# Patient Record
Sex: Male | Born: 1965 | Race: White | Hispanic: No | Marital: Married | Smoking: Current every day smoker
Health system: Southern US, Community
[De-identification: ages and names within clinical notes are randomized; demographics above are authoritative.]

## PROBLEM LIST (undated history)

## (undated) DIAGNOSIS — K219 Gastro-esophageal reflux disease without esophagitis: Secondary | ICD-10-CM

## (undated) DIAGNOSIS — I10 Essential (primary) hypertension: Secondary | ICD-10-CM

## (undated) DIAGNOSIS — Z87442 Personal history of urinary calculi: Secondary | ICD-10-CM

## (undated) DIAGNOSIS — R7303 Prediabetes: Secondary | ICD-10-CM

## (undated) HISTORY — DX: Prediabetes: R73.03

## (undated) HISTORY — PX: TOTAL HIP ARTHROPLASTY: SHX124

## (undated) HISTORY — DX: Essential (primary) hypertension: I10

## (undated) HISTORY — PX: OTHER SURGICAL HISTORY: SHX169

---

## 1992-12-21 HISTORY — PX: SPLENECTOMY: SUR1306

## 2001-06-09 ENCOUNTER — Emergency Department (HOSPITAL_COMMUNITY): Admission: EM | Admit: 2001-06-09 | Discharge: 2001-06-10 | Payer: Self-pay | Admitting: Emergency Medicine

## 2001-06-10 ENCOUNTER — Encounter: Payer: Self-pay | Admitting: Emergency Medicine

## 2002-02-11 ENCOUNTER — Emergency Department (HOSPITAL_COMMUNITY): Admission: EM | Admit: 2002-02-11 | Discharge: 2002-02-11 | Payer: Self-pay | Admitting: Emergency Medicine

## 2002-02-11 ENCOUNTER — Encounter: Payer: Self-pay | Admitting: Emergency Medicine

## 2003-08-02 ENCOUNTER — Encounter: Admission: RE | Admit: 2003-08-02 | Discharge: 2003-10-22 | Payer: Self-pay | Admitting: Occupational Medicine

## 2013-10-27 ENCOUNTER — Other Ambulatory Visit: Payer: Self-pay | Admitting: Internal Medicine

## 2013-10-27 ENCOUNTER — Ambulatory Visit (HOSPITAL_COMMUNITY)
Admission: RE | Admit: 2013-10-27 | Discharge: 2013-10-27 | Disposition: A | Discharge status: Home / Self Care | Payer: BC Managed Care – PPO | Source: Ambulatory Visit | Attending: Internal Medicine | Admitting: Internal Medicine

## 2013-10-27 DIAGNOSIS — I10 Essential (primary) hypertension: Secondary | ICD-10-CM

## 2013-10-27 DIAGNOSIS — F17200 Nicotine dependence, unspecified, uncomplicated: Secondary | ICD-10-CM | POA: Insufficient documentation

## 2013-10-30 ENCOUNTER — Telehealth: Payer: Self-pay | Admitting: *Deleted

## 2013-10-30 NOTE — Telephone Encounter (Signed)
Message copied by Reggy Eye on Mon Oct 30, 2013  8:55 AM ------      Message from: Lucky Cowboy      Created: Sun Oct 29, 2013  5:53 PM       Call CXR Normal- neg & OK ------

## 2013-12-12 ENCOUNTER — Encounter: Payer: Self-pay | Admitting: Emergency Medicine

## 2013-12-12 ENCOUNTER — Ambulatory Visit (INDEPENDENT_AMBULATORY_CARE_PROVIDER_SITE_OTHER): Payer: BC Managed Care – PPO | Admitting: Emergency Medicine

## 2013-12-12 VITALS — BP 134/82 | HR 88 | Temp 100.0°F | Resp 18 | Ht 74.25 in | Wt 276.0 lb

## 2013-12-12 DIAGNOSIS — J101 Influenza due to other identified influenza virus with other respiratory manifestations: Secondary | ICD-10-CM

## 2013-12-12 DIAGNOSIS — J111 Influenza due to unidentified influenza virus with other respiratory manifestations: Secondary | ICD-10-CM

## 2013-12-12 DIAGNOSIS — R509 Fever, unspecified: Secondary | ICD-10-CM

## 2013-12-12 DIAGNOSIS — E559 Vitamin D deficiency, unspecified: Secondary | ICD-10-CM

## 2013-12-12 DIAGNOSIS — R52 Pain, unspecified: Secondary | ICD-10-CM

## 2013-12-12 MED ORDER — ALBUTEROL SULFATE HFA 108 (90 BASE) MCG/ACT IN AERS: 2.0000 | INHALATION_SPRAY | Freq: Four times a day (QID) | RESPIRATORY_TRACT | Status: DC | PRN

## 2013-12-12 MED ORDER — AZITHROMYCIN 250 MG PO TABS: ORAL_TABLET | ORAL | Status: AC

## 2013-12-12 MED ORDER — PREDNISONE 10 MG PO TABS: ORAL_TABLET | ORAL | Status: DC

## 2013-12-12 MED ORDER — BENZONATATE 100 MG PO CAPS: 100.0000 mg | ORAL_CAPSULE | Freq: Three times a day (TID) | ORAL | Status: DC | PRN

## 2013-12-12 NOTE — Patient Instructions (Signed)

## 2013-12-12 NOTE — Progress Notes (Signed)
   Subjective:    Patient ID: Erik Quinn, male    DOB: 1966/04/26, 47 y.o.   MRN: [id number]  HPI Comments: 47 yo ill appearing male presents with sudden onset of fever, fatigue, myalgias/ arthralgias and congestion. He has not tried any OTC. He denies any know flu exposures. He did receive the FLU Vaccine this year.     Medicines Vitamin D 2000  NKDA  No past medical history on file.   Review of Systems  Constitutional: Positive for fever, chills and fatigue.  Respiratory: Positive for cough.   All other systems reviewed and are negative.   BP 134/82  Pulse 88  Temp(Src) 100 F (37.8 C) (Temporal)  Resp 18  Ht 6' 2.25" (1.886 m)  Wt 276 lb (125.193 kg)  BMI 35.20 kg/m2     Objective:   Physical Exam  Nursing note and vitals reviewed. Constitutional: He is oriented to person, place, and time. He appears well-developed and well-nourished.  Appears ill  HENT:  Head: Normocephalic and atraumatic.  Right Ear: External ear normal.  Left Ear: External ear normal.  Nose: Nose normal.  Mouth/Throat: Oropharynx is clear and moist. No oropharyngeal exudate.  TMS Injected  Eyes: Conjunctivae are normal.  Injected sclera bilaterally  Neck: Normal range of motion.  Cardiovascular: Normal rate, regular rhythm, normal heart sounds and intact distal pulses.   Pulmonary/Chest: Effort normal. He has wheezes.  Abdominal: Soft.  Musculoskeletal: Normal range of motion.  Lymphadenopathy:    He has no cervical adenopathy.  Neurological: He is alert and oriented to person, place, and time.  Skin: Skin is warm and dry.  Psychiatric: He has a normal mood and affect. Judgment normal.          Assessment & Plan:  INFLUENZA A- Declines Tamiflu. Pred 10 mg DP, Tessalon Perles 100 mg, Albuterol HFA all AD. Zpak if symptoms increase. ER IF worse SX

## 2014-10-21 DIAGNOSIS — I10 Essential (primary) hypertension: Secondary | ICD-10-CM | POA: Insufficient documentation

## 2014-10-21 DIAGNOSIS — R7303 Prediabetes: Secondary | ICD-10-CM | POA: Insufficient documentation

## 2014-10-24 ENCOUNTER — Other Ambulatory Visit: Payer: Self-pay | Admitting: Internal Medicine

## 2014-10-24 ENCOUNTER — Ambulatory Visit (INDEPENDENT_AMBULATORY_CARE_PROVIDER_SITE_OTHER): Payer: BC Managed Care – PPO | Admitting: Internal Medicine

## 2014-10-24 ENCOUNTER — Ambulatory Visit (HOSPITAL_COMMUNITY)
Admission: RE | Admit: 2014-10-24 | Discharge: 2014-10-24 | Disposition: A | Discharge status: Home / Self Care | Payer: BC Managed Care – PPO | Source: Ambulatory Visit | Attending: Internal Medicine | Admitting: Internal Medicine

## 2014-10-24 ENCOUNTER — Encounter: Payer: Self-pay | Admitting: Internal Medicine

## 2014-10-24 VITALS — BP 114/84 | HR 64 | Temp 97.6°F | Resp 16 | Ht 74.5 in | Wt 266.0 lb

## 2014-10-24 DIAGNOSIS — Z125 Encounter for screening for malignant neoplasm of prostate: Secondary | ICD-10-CM

## 2014-10-24 DIAGNOSIS — I10 Essential (primary) hypertension: Secondary | ICD-10-CM | POA: Insufficient documentation

## 2014-10-24 DIAGNOSIS — M25559 Pain in unspecified hip: Secondary | ICD-10-CM

## 2014-10-24 DIAGNOSIS — R7309 Other abnormal glucose: Secondary | ICD-10-CM | POA: Diagnosis not present

## 2014-10-24 DIAGNOSIS — Z0001 Encounter for general adult medical examination with abnormal findings: Secondary | ICD-10-CM

## 2014-10-24 DIAGNOSIS — R7989 Other specified abnormal findings of blood chemistry: Secondary | ICD-10-CM

## 2014-10-24 DIAGNOSIS — R6889 Other general symptoms and signs: Secondary | ICD-10-CM

## 2014-10-24 DIAGNOSIS — Z23 Encounter for immunization: Secondary | ICD-10-CM

## 2014-10-24 DIAGNOSIS — R7303 Prediabetes: Secondary | ICD-10-CM

## 2014-10-24 DIAGNOSIS — M25552 Pain in left hip: Secondary | ICD-10-CM | POA: Diagnosis not present

## 2014-10-24 DIAGNOSIS — Z111 Encounter for screening for respiratory tuberculosis: Secondary | ICD-10-CM

## 2014-10-24 DIAGNOSIS — R945 Abnormal results of liver function studies: Secondary | ICD-10-CM

## 2014-10-24 DIAGNOSIS — Z1212 Encounter for screening for malignant neoplasm of rectum: Secondary | ICD-10-CM

## 2014-10-24 DIAGNOSIS — E559 Vitamin D deficiency, unspecified: Secondary | ICD-10-CM

## 2014-10-24 DIAGNOSIS — Z79899 Other long term (current) drug therapy: Secondary | ICD-10-CM

## 2014-10-24 LAB — CBC WITH DIFFERENTIAL/PLATELET
BASOS PCT: 0 % (ref 0–1)
Basophils Absolute: 0 10*3/uL (ref 0.0–0.1)
EOS ABS: 0.2 10*3/uL (ref 0.0–0.7)
Eosinophils Relative: 2 % (ref 0–5)
HEMATOCRIT: 50.3 % (ref 39.0–52.0)
Hemoglobin: 17.6 g/dL — ABNORMAL HIGH (ref 13.0–17.0)
LYMPHS ABS: 2.6 10*3/uL (ref 0.7–4.0)
Lymphocytes Relative: 29 % (ref 12–46)
MCH: 35.3 pg — ABNORMAL HIGH (ref 26.0–34.0)
MCHC: 35 g/dL (ref 30.0–36.0)
MCV: 101 fL — ABNORMAL HIGH (ref 78.0–100.0)
MONO ABS: 1 10*3/uL (ref 0.1–1.0)
Monocytes Relative: 11 % (ref 3–12)
Neutro Abs: 5.2 10*3/uL (ref 1.7–7.7)
Neutrophils Relative %: 58 % (ref 43–77)
Platelets: 263 10*3/uL (ref 150–400)
RBC: 4.98 MIL/uL (ref 4.22–5.81)
RDW: 13.7 % (ref 11.5–15.5)
WBC: 8.9 10*3/uL (ref 4.0–10.5)

## 2014-10-24 NOTE — Patient Instructions (Signed)
Recommend the book "The END of DIETING" by Dr Baker Janus   and the book "The END of DIABETES " by Dr Excell Seltzer  At [email or web address] - get book & Audio CD's      Being diabetic has a  300% increased risk for heart attack, stroke, cancer, and alzheimer- type vascular dementia. It is very important that you work harder with diet by avoiding all foods that are white except chicken & fish. Avoid white rice (brown & wild rice is OK), white potatoes (sweetpotatoes in moderation is OK), White bread or wheat bread or anything made out of white flour like bagels, donuts, rolls, buns, biscuits, cakes, pastries, cookies, pizza crust, and pasta (made from white flour & egg whites) - vegetarian pasta or spinach or wheat pasta is OK. Multigrain breads like Arnold's or [employer], or multigrain sandwich thins or flatbreads.  Diet, exercise and weight loss can reverse and cure diabetes in the early stages.  Diet, exercise and weight loss is very important in the control and prevention of complications of diabetes which affects every system in your body, ie. Brain - dementia/stroke, eyes - glaucoma/blindness, heart - heart attack/heart failure, kidneys - dialysis, stomach - gastric paralysis, intestines - malabsorption, nerves - severe painful neuritis, circulation - gangrene & loss of a leg(s), and finally cancer and Alzheimers.    I recommend avoid fried & greasy foods,  sweets/candy, white rice (brown or wild rice or Quinoa is OK), white potatoes (sweet potatoes are OK) - anything made from white flour - bagels, doughnuts, rolls, buns, biscuits,white and wheat breads, pizza crust and traditional pasta made of white flour & egg white(vegetarian pasta or spinach or wheat pasta is OK).  Multi-grain bread is OK - like multi-grain flat bread or sandwich thins. Avoid alcohol in excess. Exercise is also important.    Eat all the vegetables you want - avoid meat, especially red meat and dairy - especially cheese.  Cheese  is the most concentrated form of trans-fats which is the worst thing to clog up our arteries. Veggie cheese is OK which can be found in the fresh produce section at [employer] or [employer] or [employer]   Preventive Care for Adults  A healthy lifestyle and preventive care can promote health and wellness. Preventive health guidelines for men include the following key practices:  A routine yearly physical is a good way to check with your health care provider about your health and preventative screening. It is a chance to share any concerns and updates on your health and to receive a thorough exam.  Visit your dentist for a routine exam and preventative care every 6 months. Brush your teeth twice a day and floss once a day. Good oral hygiene prevents tooth decay and gum disease.  The frequency of eye exams is based on your age, health, family medical history, use of contact lenses, and other factors. Follow your health care provider's recommendations for frequency of eye exams.  Eat a healthy diet. Foods such as vegetables, fruits, whole grains, low-fat dairy products, and lean protein foods contain the nutrients you need without too many calories. Decrease your intake of foods high in solid fats, added sugars, and salt. Eat the right amount of calories for you.Get information about a proper diet from your health care provider, if necessary.  Regular physical exercise is one of the most important things you can do for your health. Most adults should get at least 150 minutes of moderate-intensity exercise (any  increases your heart rate and causes you to sweat) each week. In addition, most adults need muscle-strengthening exercises on 2 or more days a week.  Maintain a healthy weight. The body mass index (BMI) is a screening tool to identify possible weight problems. It provides an estimate of body fat based on height and weight. Your health care provider can find your BMI and can help you  achieve or maintain a healthy weight.For adults 20 years and older:  A BMI below 18.5 is considered underweight.  A BMI of 18.5 to 24.9 is normal.  A BMI of 25 to 29.9 is considered overweight.  A BMI of 30 and above is considered obese.  Maintain normal blood lipids and cholesterol levels by exercising and minimizing your intake of saturated fat. Eat a balanced diet with plenty of fruit and vegetables. Blood tests for lipids and cholesterol should begin at age 20 and be repeated every 5 years. If your lipid or cholesterol levels are high, you are over 50, or you are at high risk for heart disease, you may need your cholesterol levels checked more frequently.Ongoing high lipid and cholesterol levels should be treated with medicines if diet and exercise are not working.  If you smoke, find out from your health care provider how to quit. If you do not use tobacco, do not start.  Lung cancer screening is recommended for adults aged 55-80 years who are at high risk for developing lung cancer because of a history of smoking. A yearly low-dose CT scan of the lungs is recommended for people who have at least a 30-pack-year history of smoking and are a current smoker or have quit within the past 15 years. A pack year of smoking is smoking an average of 1 pack of cigarettes a day for 1 year (for example: 1 pack a day for 30 years or 2 packs a day for 15 years). Yearly screening should continue until the smoker has stopped smoking for at least 15 years. Yearly screening should be stopped for people who develop a health problem that would prevent them from having lung cancer treatment.  If you choose to drink alcohol, do not have more than 2 drinks per day. One drink is considered to be 12 ounces (355 mL) of beer, 5 ounces (148 mL) of wine, or 1.5 ounces (44 mL) of liquor.  Avoid use of street drugs. Do not share needles with anyone. Ask for help if you need support or instructions about stopping the use of  drugs.  High blood pressure causes heart disease and increases the risk of stroke. Your blood pressure should be checked at least every 1-2 years. Ongoing high blood pressure should be treated with medicines, if weight loss and exercise are not effective.  If you are 45-79 years old, ask your health care provider if you should take aspirin to prevent heart disease.  Diabetes screening involves taking a blood sample to check your fasting blood sugar level. This should be done once every 3 years, after age 45, if you are within normal weight and without risk factors for diabetes. Testing should be considered at a younger age or be carried out more frequently if you are overweight and have at least 1 risk factor for diabetes.  Colorectal cancer can be detected and often prevented. Most routine colorectal cancer screening begins at the age of 50 and continues through age 75. However, your health care provider may recommend screening at an earlier age if you have risk   have risk factors for colon cancer. On a yearly basis, your health care provider may provide home test kits to check for hidden blood in the stool. Use of a small camera at the end of a tube to directly examine the colon (sigmoidoscopy or colonoscopy) can detect the earliest forms of colorectal cancer. Talk to your health care provider about this at age 23, when routine screening begins. Direct exam of the colon should be repeated every 5-10 years through age 48, unless early forms of precancerous polyps or small growths are found.   Talk with your health care provider about prostate cancer screening.  Testicular cancer screening isrecommended for adult males. Screening includes self-exam, a health care provider exam, and other screening tests. Consult with your health care provider about any symptoms you have or any concerns you have about testicular cancer.  Use sunscreen. Apply sunscreen liberally and repeatedly throughout the day. You should  seek shade when your shadow is shorter than you. Protect yourself by wearing long sleeves, pants, a wide-brimmed hat, and sunglasses year round, whenever you are outdoors.  Once a month, do a whole-body skin exam, using a mirror to look at the skin on your back. Tell your health care provider about new moles, moles that have irregular borders, moles that are larger than a pencil eraser, or moles that have changed in shape or color.  Stay current with required vaccines (immunizations).  Influenza vaccine. All adults should be immunized every year.  Tetanus, diphtheria, and acellular pertussis (Td, Tdap) vaccine. An adult who has not previously received Tdap or who does not know his vaccine status should receive 1 dose of Tdap. This initial dose should be followed by tetanus and diphtheria toxoids (Td) booster doses every 10 years. Adults with an unknown or incomplete history of completing a 3-dose immunization series with Td-containing vaccines should begin or complete a primary immunization series including a Tdap dose. Adults should receive a Td booster every 10 years.  Varicella vaccine. An adult without evidence of immunity to varicella should receive 2 doses or a second dose if he has previously received 1 dose.  Human papillomavirus (HPV) vaccine. Males aged 54-21 years who have not received the vaccine previously should receive the 3-dose series. Males aged 22-26 years may be immunized. Immunization is recommended through the age of 26 years for any male who has sex with males and did not get any or all doses earlier. Immunization is recommended for any person with an immunocompromised condition through the age of 33 years if he did not get any or all doses earlier. During the 3-dose series, the second dose should be obtained 4-8 weeks after the first dose. The third dose should be obtained 24 weeks after the first dose and 16 weeks after the second dose.  Zoster vaccine. One dose is recommended  for adults aged 60 years or older unless certain conditions are present.    PREVNAR  - Pneumococcal 13-valent conjugate (PCV13) vaccine. When indicated, a person who is uncertain of his immunization history and has no record of immunization should receive the PCV13 vaccine. An adult aged 40 years or older who has certain medical conditions and has not been previously immunized should receive 1 dose of PCV13 vaccine. This PCV13 should be followed with a dose of pneumococcal polysaccharide (PPSV23) vaccine. The PPSV23 vaccine dose should be obtained at least 8 weeks after the dose of PCV13 vaccine. An adult aged 4 years or older who has certain medical conditions and  previously received 1 or more doses of PPSV23 vaccine should receive 1 dose of PCV13. The PCV13 vaccine dose should be obtained 1 or more years after the last PPSV23 vaccine dose.    PNEUMOVAX - Pneumococcal polysaccharide (PPSV23) vaccine. When PCV13 is also indicated, PCV13 should be obtained first. All adults aged 76 years and older should be immunized. An adult younger than age 45 years who has certain medical conditions should be immunized. Any person who resides in a nursing home or long-term care facility should be immunized. An adult smoker should be immunized. People with an immunocompromised condition and certain other conditions should receive both PCV13 and PPSV23 vaccines. People with human immunodeficiency virus (HIV) infection should be immunized as soon as possible after diagnosis. Immunization during chemotherapy or radiation therapy should be avoided. Routine use of PPSV23 vaccine is not recommended for American Indians, [place] Natives, or people younger than 65 years unless there are medical conditions that require PPSV23 vaccine. When indicated, people who have unknown immunization and have no record of immunization should receive PPSV23 vaccine. One-time revaccination 5 years after the first dose of PPSV23 is recommended for  people aged 19-64 years who have chronic kidney failure, nephrotic syndrome, asplenia, or immunocompromised conditions. People who received 1-2 doses of PPSV23 before age 62 years should receive another dose of PPSV23 vaccine at age 50 years or later if at least 5 years have passed since the previous dose. Doses of PPSV23 are not needed for people immunized with PPSV23 at or after age 34 years.    Hepatitis A vaccine. Adults who wish to be protected from this disease, have certain high-risk conditions, work with hepatitis A-infected animals, work in hepatitis A research labs, or travel to or work in countries with a high rate of hepatitis A should be immunized. Adults who were previously unvaccinated and who anticipate close contact with an international adoptee during the first 60 days after arrival in the [place] from a country with a high rate of hepatitis A should be immunized.    Hepatitis B vaccine. Adults should be immunized if they wish to be protected from this disease, have certain high-risk conditions, may be exposed to blood or other infectious body fluids, are household contacts or sex partners of hepatitis B positive people, are clients or workers in certain care facilities, or travel to or work in countries with a high rate of hepatitis B.   Preventive Service / Frequency   Ages 2 to 57  Blood pressure check.  Lipid and cholesterol check.  Lung cancer screening. / Every year if you are aged 46-80 years and have a 30-pack-year history of smoking and currently smoke or have quit within the past 15 years. Yearly screening is stopped once you have quit smoking for at least 15 years or develop a health problem that would prevent you from having lung cancer treatment.  Fecal occult blood test (FOBT) of stool. / Every year beginning at age 37 and continuing until age 67. You may not have to do this test if you get a colonoscopy every 10 years.  Flexible sigmoidoscopy** or  colonoscopy.** / Every 5 years for a flexible sigmoidoscopy or every 10 years for a colonoscopy beginning at age 54 and continuing until age 28.

## 2014-10-24 NOTE — Progress Notes (Addendum)
Patient ID: Erik Quinn, male   DOB: [birth year], 48 y.o.   MRN: [id number] .  Annual Screening Comprehensive Examination  This very nice 48 y.o.MWM presents for complete physical.  Patient has been followed for HTN, T2_NIDDM  Prediabetes, Hyperlipidemia, and Vitamin D Deficiency.   Patient has hx/o labile HTN predating since Jan 2013 of 130/90 and in Oct 2014BP was 142/88.Marland Kitchen Patient's BP has been controlled at home.Today's BP: 114/84 mmHg. Patient denies any cardiac symptoms as chest pain, palpitations, shortness of breath, dizziness or ankle swelling.   Patient is screened for elevated lipids. Last lipids were Chol 97, TG 83, HDL 52 & LDL 28 in Nov 2013.   Patient has prediabetes and Insulin Resistance and patient denies reactive hypoglycemic symptoms, visual blurring, diabetic polys or paresthesias. Last A1c was 5.8% with elevated Insulin 46 in Nov 2013.    Finally, patient has history of Vitamin D Deficiency of 35 in Nov 2013.  Patient also has a history of Low T with Testosterone level of 138in Nov 2013.   Medication Sig  . albuterol (PROVENTIL HFA;VENTOLIN HFA) 108 (90 BASE) MCG/ACT inhaler Inhale 2 puffs into the lungs every 6 (six) hours as needed for wheezing or shortness of breath.   No Known Allergies  Past Medical History  Diagnosis Date  . Hypertension   . Prediabetes    Health Maintenance  Topic Date Due  . INFLUENZA VACCINE  07/21/2014  . TETANUS/TDAP  10/21/2023   Immunization History  Administered Date(s) Administered  . Influenza Split 10/24/2014  . PPD Test 10/24/2014  . Pneumococcal Conjugate-13 10/24/2014  . Pneumococcal-Unspecified 10/20/2013  . Tdap 10/20/2013   Past Surgical History  Procedure Laterality Date  . Splenectomy  1994   Family History  Problem Relation Age of Onset  . Diabetes Mother   . Sleep apnea Mother    History   Social History  . Marital Status: Single    Spouse Name: N/A    Number of Children: N/A  . Years of Education:  N/A   Occupational History  . Drives a fork-lift.   Social History Main Topics  . Smoking status: Current Every Day Smoker -- 0.33 packs/day    Types: Cigarettes  . Smokeless tobacco: Never Used  . Alcohol Use: 8.4 oz/week    14 Not specified per week  . Drug Use: No  . Sexual Activity: Active    ROS Constitutional: Denies fever, chills, weight loss/gain, headaches, insomnia, fatigue, night sweats or change in appetite. Eyes: Denies redness, blurred vision, diplopia, discharge, itchy or watery eyes.  ENT: Denies discharge, congestion, post nasal drip, epistaxis, sore throat, earache, hearing loss, dental pain, Tinnitus, Vertigo, Sinus pain or snoring.  Cardio: Denies chest pain, palpitations, irregular heartbeat, syncope, dyspnea, diaphoresis, orthopnea, PND, claudication or edema Respiratory: denies cough, dyspnea, DOE, pleurisy, hoarseness, laryngitis or wheezing.  Gastrointestinal: Denies dysphagia, heartburn, reflux, water brash, pain, cramps, nausea, vomiting, bloating, diarrhea, constipation, hematemesis, melena, hematochezia, jaundice or hemorrhoids Genitourinary: Denies dysuria, frequency, urgency, nocturia, hesitancy, discharge, hematuria or flank pain Musculoskeletal: Denies arthralgia, myalgia, stiffness, Jt. Swelling, pain, limp or strain/sprain. Denies Falls. Skin: Denies puritis, rash, hives, warts, acne, eczema or change in skin lesion Neuro: No weakness, tremor, incoordination, spasms, paresthesia or pain Psychiatric: Denies confusion, memory loss or sensory loss. Denies Depression. Endocrine: Denies change in weight, skin, hair change, nocturia, and paresthesia, diabetic polys, visual blurring or hyper / hypo glycemic episodes.  Heme/Lymph: No excessive bleeding, bruising or enlarged lymph nodes.  Physical Exam  BP  114/84 mmHg  Pulse 64  Temp(Src) 97.6 F (36.4 C)  Resp 16  Ht 6' 2.5" (1.892 m)  Wt 266 lb (120.657 kg)  BMI 33.71 kg/m2  General Appearance: Well  nourished, in no apparent distress. Eyes: PERRLA, EOMs, conjunctiva no swelling or erythema, normal fundi and vessels. Sinuses: No frontal/maxillary tenderness ENT/Mouth: EACs patent / TMs  nl. Nares clear without erythema, swelling, mucoid exudates. Oral hygiene is good. No erythema, swelling, or exudate. Tongue normal, non-obstructing. Tonsils not swollen or erythematous. Hearing normal.  Neck: Supple, thyroid normal. No bruits, nodes or JVD. Respiratory: Respiratory effort normal.  BS equal and clear bilateral without rales, rhonci, wheezing or stridor. Cardio: Heart sounds are normal with regular rate and rhythm and no murmurs, rubs or gallops. Peripheral pulses are normal and equal bilaterally without edema. No aortic or femoral bruits. Chest: symmetric with normal excursions and percussion.  Abdomen: Flat, soft, with bowl sounds. Nontender, no guarding, rebound, hernias, masses, or organomegaly.  Lymphatics: Non tender without lymphadenopathy.  Genitourinary: No hernias.Testes nl. DRE - prostate nl for age - smooth & firm w/o nodules. Musculoskeletal: Full ROM all peripheral extremities, joint stability, 5/5 strength, and normal gait. Skin: Warm and dry without rashes, lesions, cyanosis, clubbing or  ecchymosis.  Neuro: Cranial nerves intact, reflexes equal bilaterally. Normal muscle tone, no cerebellar symptoms. Sensation intact.  Pysch: Awake and oriented X 3with normal affect, insight and judgment appropriate.   Assessment and Plan  1. Annual Screening Examination 2. Hypertension, labile 3. Pre Diabetes 4. Vitamin D Deficiency 5. Testosterone Deficiency   Continue prudent diet as discussed, weight control, BP monitoring, regular exercise, and medications as discussed.  Discussed med effects and SE's. Routine screening labs and tests as requested with regular follow-up as recommended.

## 2014-10-25 ENCOUNTER — Telehealth: Payer: Self-pay | Admitting: *Deleted

## 2014-10-25 LAB — LIPID PANEL
CHOL/HDL RATIO: 1.8 ratio
CHOLESTEROL: 101 mg/dL (ref 0–200)
HDL: 55 mg/dL (ref >39)
LDL Cholesterol: 31 mg/dL (ref 0–99)
Triglycerides: 74 mg/dL (ref <150)
VLDL: 15 mg/dL (ref 0–40)

## 2014-10-25 LAB — TSH: TSH: 1.405 u[IU]/mL (ref 0.350–4.500)

## 2014-10-25 LAB — HEMOGLOBIN A1C
HEMOGLOBIN A1C: 5.6 % (ref <5.7)
MEAN PLASMA GLUCOSE: 114 mg/dL (ref <117)

## 2014-10-25 LAB — HEPATITIS B SURFACE ANTIBODY,QUALITATIVE: Hep B S Ab: NEGATIVE

## 2014-10-25 LAB — MICROALBUMIN / CREATININE URINE RATIO
Creatinine, Urine: 246.4 mg/dL
MICROALB UR: 0.8 mg/dL (ref <2.0)
Microalb Creat Ratio: 3.2 mg/g (ref 0.0–30.0)

## 2014-10-25 LAB — HEPATITIS A ANTIBODY, TOTAL: Hep A Total Ab: NONREACTIVE

## 2014-10-25 LAB — HEPATIC FUNCTION PANEL
ALT: 26 U/L (ref 0–53)
AST: 20 U/L (ref 0–37)
Albumin: 4.1 g/dL (ref 3.5–5.2)
Alkaline Phosphatase: 83 U/L (ref 39–117)
BILIRUBIN DIRECT: 0.3 mg/dL (ref 0.0–0.3)
BILIRUBIN INDIRECT: 0.7 mg/dL (ref 0.2–1.2)
Total Bilirubin: 1 mg/dL (ref 0.2–1.2)
Total Protein: 7.2 g/dL (ref 6.0–8.3)

## 2014-10-25 LAB — VITAMIN B12: VITAMIN B 12: 410 pg/mL (ref 211–911)

## 2014-10-25 LAB — IRON AND TIBC
%SAT: 73 % — ABNORMAL HIGH (ref 20–55)
Iron: 238 ug/dL — ABNORMAL HIGH (ref 42–165)
TIBC: 325 ug/dL (ref 215–435)
UIBC: 87 ug/dL — ABNORMAL LOW (ref 125–400)

## 2014-10-25 LAB — BASIC METABOLIC PANEL WITH GFR
BUN: 18 mg/dL (ref 6–23)
CALCIUM: 9.8 mg/dL (ref 8.4–10.5)
CO2: 27 mEq/L (ref 19–32)
Chloride: 105 mEq/L (ref 96–112)
Creat: 1.13 mg/dL (ref 0.50–1.35)
GFR, EST AFRICAN AMERICAN: 88 mL/min
GFR, Est Non African American: 76 mL/min
GLUCOSE: 90 mg/dL (ref 70–99)
POTASSIUM: 4.6 meq/L (ref 3.5–5.3)
Sodium: 140 mEq/L (ref 135–145)

## 2014-10-25 LAB — HEPATITIS B CORE ANTIBODY, TOTAL: Hep B Core Total Ab: NONREACTIVE

## 2014-10-25 LAB — URINALYSIS, MICROSCOPIC ONLY
Bacteria, UA: NONE SEEN
Casts: NONE SEEN
Crystals: NONE SEEN
Squamous Epithelial / LPF: NONE SEEN

## 2014-10-25 LAB — PSA: PSA: 0.34 ng/mL (ref <=4.00)

## 2014-10-25 LAB — INSULIN, FASTING: Insulin fasting, serum: 11 u[IU]/mL (ref 2.0–19.6)

## 2014-10-25 LAB — HEPATITIS C ANTIBODY: HCV AB: NEGATIVE

## 2014-10-25 LAB — TESTOSTERONE: Testosterone: 192 ng/dL — ABNORMAL LOW (ref 300–890)

## 2014-10-25 LAB — MAGNESIUM: Magnesium: 1.9 mg/dL (ref 1.5–2.5)

## 2014-10-25 LAB — VITAMIN D 25 HYDROXY (VIT D DEFICIENCY, FRACTURES): Vit D, 25-Hydroxy: 34 ng/mL (ref 30–89)

## 2014-10-25 NOTE — Telephone Encounter (Signed)
error 

## 2014-10-25 NOTE — Telephone Encounter (Deleted)
Error. No call made

## 2014-10-26 LAB — HEPATITIS B E ANTIBODY: Hepatitis Be Antibody: NONREACTIVE

## 2014-10-31 ENCOUNTER — Telehealth: Payer: Self-pay | Admitting: *Deleted

## 2014-10-31 ENCOUNTER — Other Ambulatory Visit: Payer: Self-pay | Admitting: Internal Medicine

## 2014-10-31 DIAGNOSIS — M25551 Pain in right hip: Secondary | ICD-10-CM

## 2014-10-31 DIAGNOSIS — M25552 Pain in left hip: Secondary | ICD-10-CM

## 2014-10-31 NOTE — Telephone Encounter (Signed)
Spoke with patient aboout labs.  Per Dr Melford Aase, cholesterol great at 101,A1c normal, testosterone low at 192(patient chose to watch level and no treatment), and vitamin D low at 34 and advised patient to take 10000 units of vitamin D daily.  Informed patient of hip x- ray result. Per Dr Melford Aase, since hips are still hurting , he will order MRI of hips.  Patient aware.

## 2014-11-28 ENCOUNTER — Other Ambulatory Visit (INDEPENDENT_AMBULATORY_CARE_PROVIDER_SITE_OTHER): Payer: BC Managed Care – PPO

## 2014-11-28 DIAGNOSIS — Z1212 Encounter for screening for malignant neoplasm of rectum: Secondary | ICD-10-CM

## 2014-11-28 LAB — POC HEMOCCULT BLD/STL (HOME/3-CARD/SCREEN)
Card #2 Fecal Occult Blod, POC: NEGATIVE
Card #3 Fecal Occult Blood, POC: NEGATIVE
Fecal Occult Blood, POC: NEGATIVE

## 2014-11-29 ENCOUNTER — Other Ambulatory Visit: Payer: Self-pay

## 2014-11-29 DIAGNOSIS — M6740 Ganglion, unspecified site: Secondary | ICD-10-CM

## 2015-02-16 ENCOUNTER — Encounter: Payer: Self-pay | Admitting: *Deleted

## 2015-02-17 ENCOUNTER — Emergency Department (HOSPITAL_COMMUNITY): Payer: BLUE CROSS/BLUE SHIELD

## 2015-02-17 ENCOUNTER — Emergency Department (HOSPITAL_COMMUNITY)
Admission: EM | Admit: 2015-02-17 | Discharge: 2015-02-17 | Disposition: A | Discharge status: Home / Self Care | Payer: BLUE CROSS/BLUE SHIELD | Attending: Emergency Medicine | Admitting: Emergency Medicine

## 2015-02-17 ENCOUNTER — Encounter (HOSPITAL_COMMUNITY): Payer: Self-pay | Admitting: *Deleted

## 2015-02-17 DIAGNOSIS — S199XXA Unspecified injury of neck, initial encounter: Secondary | ICD-10-CM | POA: Insufficient documentation

## 2015-02-17 DIAGNOSIS — Z720 Tobacco use: Secondary | ICD-10-CM | POA: Diagnosis not present

## 2015-02-17 DIAGNOSIS — W1789XA Other fall from one level to another, initial encounter: Secondary | ICD-10-CM | POA: Diagnosis not present

## 2015-02-17 DIAGNOSIS — Y9289 Other specified places as the place of occurrence of the external cause: Secondary | ICD-10-CM | POA: Insufficient documentation

## 2015-02-17 DIAGNOSIS — S4990XA Unspecified injury of shoulder and upper arm, unspecified arm, initial encounter: Secondary | ICD-10-CM

## 2015-02-17 DIAGNOSIS — Y998 Other external cause status: Secondary | ICD-10-CM | POA: Diagnosis not present

## 2015-02-17 DIAGNOSIS — Z79899 Other long term (current) drug therapy: Secondary | ICD-10-CM | POA: Insufficient documentation

## 2015-02-17 DIAGNOSIS — S2232XA Fracture of one rib, left side, initial encounter for closed fracture: Secondary | ICD-10-CM | POA: Insufficient documentation

## 2015-02-17 DIAGNOSIS — Y9389 Activity, other specified: Secondary | ICD-10-CM | POA: Diagnosis not present

## 2015-02-17 DIAGNOSIS — S42352A Displaced comminuted fracture of shaft of humerus, left arm, initial encounter for closed fracture: Secondary | ICD-10-CM | POA: Insufficient documentation

## 2015-02-17 DIAGNOSIS — S4992XA Unspecified injury of left shoulder and upper arm, initial encounter: Secondary | ICD-10-CM | POA: Diagnosis present

## 2015-02-17 DIAGNOSIS — S42292A Other displaced fracture of upper end of left humerus, initial encounter for closed fracture: Secondary | ICD-10-CM | POA: Insufficient documentation

## 2015-02-17 DIAGNOSIS — S42302A Unspecified fracture of shaft of humerus, left arm, initial encounter for closed fracture: Secondary | ICD-10-CM

## 2015-02-17 DIAGNOSIS — I10 Essential (primary) hypertension: Secondary | ICD-10-CM | POA: Diagnosis not present

## 2015-02-17 LAB — I-STAT CHEM 8, ED
BUN: 18 mg/dL (ref 6–23)
CALCIUM ION: 1.07 mmol/L — AB (ref 1.12–1.23)
CREATININE: 1 mg/dL (ref 0.50–1.35)
Chloride: 105 mmol/L (ref 96–112)
Glucose, Bld: 126 mg/dL — ABNORMAL HIGH (ref 70–99)
HEMATOCRIT: 48 % (ref 39.0–52.0)
HEMOGLOBIN: 16.3 g/dL (ref 13.0–17.0)
POTASSIUM: 4 mmol/L (ref 3.5–5.1)
SODIUM: 143 mmol/L (ref 135–145)
TCO2: 19 mmol/L (ref 0–100)

## 2015-02-17 LAB — CBC
HEMATOCRIT: 41.2 % (ref 39.0–52.0)
HEMOGLOBIN: 14.1 g/dL (ref 13.0–17.0)
MCH: 34.4 pg — AB (ref 26.0–34.0)
MCHC: 34.2 g/dL (ref 30.0–36.0)
MCV: 100.5 fL — ABNORMAL HIGH (ref 78.0–100.0)
Platelets: 218 10*3/uL (ref 150–400)
RBC: 4.1 MIL/uL — ABNORMAL LOW (ref 4.22–5.81)
RDW: 13.4 % (ref 11.5–15.5)
WBC: 14.3 10*3/uL — AB (ref 4.0–10.5)

## 2015-02-17 LAB — COMPREHENSIVE METABOLIC PANEL
ALBUMIN: 3.5 g/dL (ref 3.5–5.2)
ALT: 24 U/L (ref 0–53)
AST: 32 U/L (ref 0–37)
Alkaline Phosphatase: 68 U/L (ref 39–117)
Anion gap: 8 (ref 5–15)
BUN: 16 mg/dL (ref 6–23)
CO2: 23 mmol/L (ref 19–32)
Calcium: 8 mg/dL — ABNORMAL LOW (ref 8.4–10.5)
Chloride: 108 mmol/L (ref 96–112)
Creatinine, Ser: 0.86 mg/dL (ref 0.50–1.35)
GFR calc Af Amer: >90 mL/min (ref >90)
GFR calc non Af Amer: >90 mL/min (ref >90)
Glucose, Bld: 129 mg/dL — ABNORMAL HIGH (ref 70–99)
Potassium: 4.1 mmol/L (ref 3.5–5.1)
Sodium: 139 mmol/L (ref 135–145)
Total Bilirubin: 0.5 mg/dL (ref 0.3–1.2)
Total Protein: 5.7 g/dL — ABNORMAL LOW (ref 6.0–8.3)

## 2015-02-17 LAB — URINALYSIS, ROUTINE W REFLEX MICROSCOPIC
Bilirubin Urine: NEGATIVE
Glucose, UA: NEGATIVE mg/dL
KETONES UR: 15 mg/dL — AB
LEUKOCYTES UA: NEGATIVE
NITRITE: NEGATIVE
Protein, ur: 30 mg/dL — AB
Specific Gravity, Urine: >1.046 — ABNORMAL HIGH (ref 1.005–1.030)
Urobilinogen, UA: 0.2 mg/dL (ref 0.0–1.0)
pH: 5.5 (ref 5.0–8.0)

## 2015-02-17 LAB — RAPID URINE DRUG SCREEN, HOSP PERFORMED
AMPHETAMINES: NOT DETECTED
BARBITURATES: NOT DETECTED
Benzodiazepines: NOT DETECTED
Cocaine: NOT DETECTED
Opiates: POSITIVE — AB
TETRAHYDROCANNABINOL: POSITIVE — AB

## 2015-02-17 LAB — URINE MICROSCOPIC-ADD ON

## 2015-02-17 LAB — ETHANOL: Alcohol, Ethyl (B): 147 mg/dL — ABNORMAL HIGH (ref 0–9)

## 2015-02-17 LAB — PROTIME-INR
INR: 1.19 (ref 0.00–1.49)
PROTHROMBIN TIME: 15.3 s — AB (ref 11.6–15.2)

## 2015-02-17 MED ORDER — ONDANSETRON 4 MG PO TBDP
4.0000 mg | ORAL_TABLET | Freq: Once | ORAL | Status: DC
  Filled 2015-02-17: qty 1

## 2015-02-17 MED ORDER — HYDROCODONE-ACETAMINOPHEN 5-325 MG PO TABS: 1.0000 | ORAL_TABLET | Freq: Four times a day (QID) | ORAL | Status: DC | PRN

## 2015-02-17 MED ORDER — SODIUM CHLORIDE 0.9 % IV BOLUS (SEPSIS)
1000.0000 mL | Freq: Once | INTRAVENOUS | Status: AC
  Administered 2015-02-17: 1000 mL via INTRAVENOUS

## 2015-02-17 MED ORDER — ONDANSETRON HCL 4 MG/2ML IJ SOLN
4.0000 mg | Freq: Once | INTRAMUSCULAR | Status: AC
  Administered 2015-02-17: 4 mg via INTRAVENOUS
  Filled 2015-02-17: qty 2

## 2015-02-17 MED ORDER — IOHEXOL 300 MG/ML  SOLN
100.0000 mL | Freq: Once | INTRAMUSCULAR | Status: AC | PRN
  Administered 2015-02-17: 100 mL via INTRAVENOUS

## 2015-02-17 MED ORDER — NAPROXEN 500 MG PO TABS
500.0000 mg | ORAL_TABLET | Freq: Two times a day (BID) | ORAL | Status: DC
Start: 2015-02-17 — End: 2015-10-22

## 2015-02-17 MED ORDER — MORPHINE SULFATE 4 MG/ML IJ SOLN
4.0000 mg | Freq: Once | INTRAMUSCULAR | Status: AC
  Administered 2015-02-17: 4 mg via INTRAVENOUS
  Filled 2015-02-17: qty 1

## 2015-02-17 NOTE — ED Notes (Signed)
Per EMS- pt was drinking last night and fell off a porch.  Pt c/o left rib pain, SOB, and left arm pain.  4mg  Zofran and 100 Fentynl given in route.  Bp-112/70 R-18 O2-94 % RA CBG-123.  Pt A x 4, NAD.

## 2015-02-17 NOTE — ED Provider Notes (Signed)
CSN: [id number]     Arrival date & time 02/17/15  4008 History   First MD Initiated Contact with Patient 02/17/15 [phone]     Chief Complaint  Patient presents with  . Rib Injury  . Shortness of Breath  . Arm Injury     (Consider location/radiation/quality/duration/timing/severity/associated sxs/prior Treatment) HPI   This is a 49 year old man presents via EMS for fall, left arm, left rib pain and shortness of breath. He has a past medical history of hypertension, diabetes. He is a current daily smoker and drinks alcohol daily. There is a level V caveat due to alcohol intoxication. The patient states that he was at home last night. He states that he drank "more than the average man can tolerate." He fell off of a 7 foot high porch onto his stairs landing on the left arm and ribs. He states it took about 2 hours to drag himself into the house. The patient called EMS and was brought here for further evaluation. He denies head trauma, loss of consciousness, hip pain, leg pain or back pain.  Past Medical History  Diagnosis Date  . Hypertension   . Prediabetes    Past Surgical History  Procedure Laterality Date  . Splenectomy  1994   Family History  Problem Relation Age of Onset  . Diabetes Mother   . Sleep apnea Mother    History  Substance Use Topics  . Smoking status: Current Every Day Smoker -- 0.33 packs/day    Types: Cigarettes  . Smokeless tobacco: Never Used  . Alcohol Use: 8.4 oz/week    14 Standard drinks or equivalent per week    Review of Systems  Unable to perform ROS     Allergies  Review of patient's allergies indicates no known allergies.  Home Medications   Prior to Admission medications   Medication Sig Start Date End Date Taking? Authorizing Provider  albuterol (PROVENTIL HFA;VENTOLIN HFA) 108 (90 BASE) MCG/ACT inhaler Inhale 2 puffs into the lungs every 6 (six) hours as needed for wheezing or shortness of breath. 12/12/13  Yes Melissa R Smith, PA-C    HYDROcodone-acetaminophen (NORCO) 5-325 MG per tablet Take 1-2 tablets by mouth every 6 (six) hours as needed for moderate pain. 02/17/15   Margarita Mail, PA-C  naproxen (NAPROSYN) 500 MG tablet Take 1 tablet (500 mg total) by mouth 2 (two) times daily with a meal. 02/17/15   Margarita Mail, PA-C   BP 113/72 mmHg  Pulse 79  Temp(Src) 97.9 F (36.6 C) (Oral)  Resp 24  SpO2 97% Physical Exam  Constitutional: He appears well-developed and well-nourished. No distress.  HENT:  Head: Normocephalic and atraumatic.  Right Ear: External ear normal.  Left Ear: External ear normal.  Mouth/Throat: Oropharynx is clear and moist.  Eyes: Conjunctivae and EOM are normal. Pupils are equal, round, and reactive to light.  Neck: Normal range of motion. Neck supple. No JVD present.  Midline cervical tenderness  Cardiovascular: Normal rate, regular rhythm and normal heart sounds.   Pulmonary/Chest:    Breathing is shallow  Musculoskeletal:       Arms: Nursing note and vitals reviewed.   ED Course  SPLINT APPLICATION Date/Time: 08/26/931 8:05 AM Performed by: Margarita Mail Authorized by: Margarita Mail Consent: Verbal consent obtained. Risks and benefits: risks, benefits and alternatives were discussed Time out: Immediately prior to procedure a "time out" was called to verify the correct patient, procedure, equipment, support staff and site/side marked as required. Location details: left arm Splint type:  Short arm coaptation splint. Post-procedure: The splinted body part was neurovascularly unchanged following the procedure. Patient tolerance: Patient tolerated the procedure well with no immediate complications   (including critical care time) Labs Review Labs Reviewed  CBC - Abnormal; Notable for the following:    WBC 14.3 (*)    RBC 4.10 (*)    MCV 100.5 (*)    MCH 34.4 (*)    All other components within normal limits  COMPREHENSIVE METABOLIC PANEL - Abnormal; Notable for the  following:    Glucose, Bld 129 (*)    Calcium 8.0 (*)    Total Protein 5.7 (*)    All other components within normal limits  ETHANOL - Abnormal; Notable for the following:    Alcohol, Ethyl (B) 147 (*)    All other components within normal limits  URINALYSIS, ROUTINE W REFLEX MICROSCOPIC - Abnormal; Notable for the following:    Specific Gravity, Urine >1.046 (*)    Hgb urine dipstick TRACE (*)    Ketones, ur 15 (*)    Protein, ur 30 (*)    All other components within normal limits  URINE RAPID DRUG SCREEN (HOSP PERFORMED) - Abnormal; Notable for the following:    Opiates POSITIVE (*)    Tetrahydrocannabinol POSITIVE (*)    All other components within normal limits  PROTIME-INR - Abnormal; Notable for the following:    Prothrombin Time 15.3 (*)    All other components within normal limits  URINE MICROSCOPIC-ADD ON - Abnormal; Notable for the following:    Casts HYALINE CASTS (*)    All other components within normal limits  I-STAT CHEM 8, ED - Abnormal; Notable for the following:    Glucose, Bld 126 (*)    Calcium, Ion 1.07 (*)    All other components within normal limits    Imaging Review Dg Chest 1 View  02/17/2015   CLINICAL DATA:  49 year old male status post fall downstairs  EXAM: CHEST  1 VIEW  COMPARISON:  Prior chest x-ray 10/25/2014  FINDINGS: Cardiac and mediastinal contours are within normal limits. There may be minimal pulmonary vascular congestion with slight cephalization. No evidence of pneumothorax. Incidental note is made of an azygos fissure. There is an acute displaced fracture of the lateral aspect of the left seventh rib. Slight thickening of the underlying pleura consistent with subpleural edema or hematoma. No evidence of hemo thorax. There may also be a mildly displaced fracture of the left sixth rib.  IMPRESSION: 1. Acute displaced fracture of the lateral aspect of the left seventh rib. 2. Suspect minimally displaced fracture of the lateral aspect of the left  6 rib as well. 3. Associated mild subpleural thickening likely reflecting edema or hematoma. 4. Mild pulmonary vascular congestion with cephalization. No overt pulmonary edema.   Electronically Signed   By: Jacqulynn Cadet M.D.   On: 02/17/2015 11:25   Ct Chest W Contrast  02/17/2015   CLINICAL DATA:  Trauma, fall from porch. Rib and chest pain. Left arm pain. Short of breath.  EXAM: CT CHEST, ABDOMEN, AND PELVIS WITH CONTRAST  TECHNIQUE: Multidetector CT imaging of the chest, abdomen and pelvis was performed following the standard protocol during bolus administration of intravenous contrast.  CONTRAST:  134mL OMNIPAQUE IOHEXOL 300 MG/ML  SOLN  COMPARISON:  Chest radiograph 10/25/2014  FINDINGS: CT CHEST FINDINGS  Mediastinum/Nodes: No contour abnormality aorta to suggest a dissection or transsection. Great vessels are normal. Incidental note of the aberrant right subclavian artery coursing behind the esophagus.  No pericardial fluid. Esophagus is normal. No mediastinal hematoma.  Lungs/Pleura: Prominent azygos fissure and azygos vein. Mild bibasilar atelectasis present. No pulmonary contusion or pleural fluid. No pneumothorax.  Musculoskeletal: Minimally displaced fractures of the sixth and seventh left lateral ribs (image 36 and 48 of series 3. There is a fracture of the proximal left humerus which is incompletely imaged.  CT ABDOMEN AND PELVIS FINDINGS  Hepatobiliary: No evidence of solid organ injury to the liver. Gallbladder normal.  Pancreas: No pancreatic injury.  Spleen: Post splenectomy. There several small splenules in the left upper quadrant which are normal  Adrenals/Urinary Tract: The small nodule of the left adrenal gland which measures 16 mm. This has washout characteristics (greater than 40%) which there is consistent with benign adenoma. Kidneys are normal. There is a nonobstructing calculus in the left kidney. No ureteral or renal injury. The bladder is intact.  Stomach/Bowel: Stomach, small  bowel, and colon are unremarkable. No evidence of mesenteric injury.  Vascular/Lymphatic: 12 aorta is normal caliber without traumatic injury. No abdominal pelvic lymphadenopathy.  Reproductive: Prostate gland is normal.  Musculoskeletal: No pelvic fracture or sacral fracture. No spine fracture. The  IMPRESSION: Chest Impression:  1. Fracture of the left lateral sixth and seventh ribs. No associated pneumothorax. 2. Incomplete imaging of proximal left humerus fracture. 3. No evidence of mediastinal injury. Aberrant right subclavian artery.  Abdomen / Pelvis Impression:  1. No evidence of traumatic injury within the abdomen or pelvis. 2. Post splenectomy with some splenic regeneration. 3. Benign left adrenal adenoma.  Nonobstructing left renal calculus. 4. No evidence of pelvic fracture or spine fracture.   Electronically Signed   By: Suzy Bouchard M.D.   On: 02/17/2015 12:47   Ct Abdomen Pelvis W Contrast  02/17/2015   CLINICAL DATA:  Trauma, fall from porch. Rib and chest pain. Left arm pain. Short of breath.  EXAM: CT CHEST, ABDOMEN, AND PELVIS WITH CONTRAST  TECHNIQUE: Multidetector CT imaging of the chest, abdomen and pelvis was performed following the standard protocol during bolus administration of intravenous contrast.  CONTRAST:  178mL OMNIPAQUE IOHEXOL 300 MG/ML  SOLN  COMPARISON:  Chest radiograph 10/25/2014  FINDINGS: CT CHEST FINDINGS  Mediastinum/Nodes: No contour abnormality aorta to suggest a dissection or transsection. Great vessels are normal. Incidental note of the aberrant right subclavian artery coursing behind the esophagus. No pericardial fluid. Esophagus is normal. No mediastinal hematoma.  Lungs/Pleura: Prominent azygos fissure and azygos vein. Mild bibasilar atelectasis present. No pulmonary contusion or pleural fluid. No pneumothorax.  Musculoskeletal: Minimally displaced fractures of the sixth and seventh left lateral ribs (image 36 and 48 of series 3. There is a fracture of the  proximal left humerus which is incompletely imaged.  CT ABDOMEN AND PELVIS FINDINGS  Hepatobiliary: No evidence of solid organ injury to the liver. Gallbladder normal.  Pancreas: No pancreatic injury.  Spleen: Post splenectomy. There several small splenules in the left upper quadrant which are normal  Adrenals/Urinary Tract: The small nodule of the left adrenal gland which measures 16 mm. This has washout characteristics (greater than 40%) which there is consistent with benign adenoma. Kidneys are normal. There is a nonobstructing calculus in the left kidney. No ureteral or renal injury. The bladder is intact.  Stomach/Bowel: Stomach, small bowel, and colon are unremarkable. No evidence of mesenteric injury.  Vascular/Lymphatic: 12 aorta is normal caliber without traumatic injury. No abdominal pelvic lymphadenopathy.  Reproductive: Prostate gland is normal.  Musculoskeletal: No pelvic fracture or sacral fracture. No spine  fracture. The  IMPRESSION: Chest Impression:  1. Fracture of the left lateral sixth and seventh ribs. No associated pneumothorax. 2. Incomplete imaging of proximal left humerus fracture. 3. No evidence of mediastinal injury. Aberrant right subclavian artery.  Abdomen / Pelvis Impression:  1. No evidence of traumatic injury within the abdomen or pelvis. 2. Post splenectomy with some splenic regeneration. 3. Benign left adrenal adenoma.  Nonobstructing left renal calculus. 4. No evidence of pelvic fracture or spine fracture.   Electronically Signed   By: Suzy Bouchard M.D.   On: 02/17/2015 12:47   Dg Humerus Left  02/17/2015   CLINICAL DATA:  Fall down stairs  EXAM: LEFT HUMERUS - 2+ VIEW  COMPARISON:  None.  FINDINGS: Comminuted, segmental, displaced/angulated fracture of the mid humerus.  Fracture appears to extends into the humeral head/neck, nondisplaced.  IMPRESSION: Comminuted fracture involving the proximal humerus and mid humeral shaft, as described above.   Electronically Signed   By:  Julian Hy M.D.   On: 02/17/2015 11:22     EKG Interpretation None      MDM   Final diagnoses:  Humerus fracture, left, closed, initial encounter  Left rib fracture, closed, initial encounter    She was seen in shared visit with Dr. Maryan Rued. Imaging shows a comminuted and displaced humeral fracture, which will clearly need ORIF. Patient has fracture of the left lateral sixth and seventh ribs. No associated pulmonary damage. I placed a consult call to Dr. Fredonia Highland.   Patient seen in shared visit visit with Dr. Fredonia Highland. He will bel placed in a coaptation splint. The patient will be discharged with pain medication  Definitive Fracture Care:  Definitive fracture care performred for the Rib.  This included analgesia in the ED, incentive and spiromoter which have been provided.  I have counseled the pt on possible complications of the fractures and signs and symptoms which would mandate return for further care.  Patient has a follow up appointment at 8:30 am tomorrow with Dr. Percell Miller. I have spoken with the patient and discussed return precautions..  Discharge patient had presyncopal event. He was given oral fluids, crackers and juice, patient placed in Trendelenburg. He is feeling much better with stable vital signs at discharge. Patient appears safe for discharge at this time.      Margarita Mail, PA-C 02/19/15 [id number]  Blanchie Dessert, MD 02/19/15 2127

## 2015-02-17 NOTE — Progress Notes (Signed)
Orthopedic Tech Progress Note Patient Details:  Erik Quinn 1966/01/11 [id number] Applied fiberglass coaptation splint to LUE.  Pulses, sensation, motion intact before and after splinting.  Capillary refill less than 2 seconds before and after splinting.  Placed splinted LUE in arm sling. Ortho Devices Type of Ortho Device: Coapt Ortho Device/Splint Location: Eula Flax 02/17/2015, 2:01 PM

## 2015-02-17 NOTE — ED Notes (Signed)
Patient transported to X-ray 

## 2015-02-17 NOTE — Consult Note (Signed)
ORTHOPAEDIC CONSULTATION  REQUESTING PHYSICIAN: Blanchie Dessert, MD  Chief Complaint: L humerus fracture  HPI: Erik Quinn is a 49 y.o. male who complains of a fall from 83f onto his stairs while intoxicated last night. Pain in his left arm/shoulder and left flank. Denies any other pain  Past Medical History  Diagnosis Date  . Hypertension   . Prediabetes    Past Surgical History  Procedure Laterality Date  . Splenectomy  1994   History   Social History  . Marital Status: Married    Spouse Name: N/A  . Number of Children: N/A  . Years of Education: N/A   Social History Main Topics  . Smoking status: Current Every Day Smoker -- 0.33 packs/day    Types: Cigarettes  . Smokeless tobacco: Never Used  . Alcohol Use: 8.4 oz/week    14 Standard drinks or equivalent per week  . Drug Use: No  . Sexual Activity: Not on file   Other Topics Concern  . None   Social History Narrative   Family History  Problem Relation Age of Onset  . Diabetes Mother   . Sleep apnea Mother    No Known Allergies Prior to Admission medications   Medication Sig Start Date End Date Taking? Authorizing Provider  albuterol (PROVENTIL HFA;VENTOLIN HFA) 108 (90 BASE) MCG/ACT inhaler Inhale 2 puffs into the lungs every 6 (six) hours as needed for wheezing or shortness of breath. 12/12/13  Yes MArdis Hughs PA-C   Dg Chest 1 View  02/17/2015   CLINICAL DATA:  49year old male status post fall downstairs  EXAM: CHEST  1 VIEW  COMPARISON:  Prior chest x-ray 10/25/2014  FINDINGS: Cardiac and mediastinal contours are within normal limits. There may be minimal pulmonary vascular congestion with slight cephalization. No evidence of pneumothorax. Incidental note is made of an azygos fissure. There is an acute displaced fracture of the lateral aspect of the left seventh rib. Slight thickening of the underlying pleura consistent with subpleural edema or hematoma. No evidence of hemo thorax. There may  also be a mildly displaced fracture of the left sixth rib.  IMPRESSION: 1. Acute displaced fracture of the lateral aspect of the left seventh rib. 2. Suspect minimally displaced fracture of the lateral aspect of the left 6 rib as well. 3. Associated mild subpleural thickening likely reflecting edema or hematoma. 4. Mild pulmonary vascular congestion with cephalization. No overt pulmonary edema.   Electronically Signed   By: HJacqulynn CadetM.D.   On: 02/17/2015 11:25   Ct Chest W Contrast  02/17/2015   CLINICAL DATA:  Trauma, fall from porch. Rib and chest pain. Left arm pain. Short of breath.  EXAM: CT CHEST, ABDOMEN, AND PELVIS WITH CONTRAST  TECHNIQUE: Multidetector CT imaging of the chest, abdomen and pelvis was performed following the standard protocol during bolus administration of intravenous contrast.  CONTRAST:  1048mOMNIPAQUE IOHEXOL 300 MG/ML  SOLN  COMPARISON:  Chest radiograph 10/25/2014  FINDINGS: CT CHEST FINDINGS  Mediastinum/Nodes: No contour abnormality aorta to suggest a dissection or transsection. Great vessels are normal. Incidental note of the aberrant right subclavian artery coursing behind the esophagus. No pericardial fluid. Esophagus is normal. No mediastinal hematoma.  Lungs/Pleura: Prominent azygos fissure and azygos vein. Mild bibasilar atelectasis present. No pulmonary contusion or pleural fluid. No pneumothorax.  Musculoskeletal: Minimally displaced fractures of the sixth and seventh left lateral ribs (image 36 and 48 of series 3. There is a fracture of the proximal left humerus  which is incompletely imaged.  CT ABDOMEN AND PELVIS FINDINGS  Hepatobiliary: No evidence of solid organ injury to the liver. Gallbladder normal.  Pancreas: No pancreatic injury.  Spleen: Post splenectomy. There several small splenules in the left upper quadrant which are normal  Adrenals/Urinary Tract: The small nodule of the left adrenal gland which measures 16 mm. This has washout characteristics  (greater than 40%) which there is consistent with benign adenoma. Kidneys are normal. There is a nonobstructing calculus in the left kidney. No ureteral or renal injury. The bladder is intact.  Stomach/Bowel: Stomach, small bowel, and colon are unremarkable. No evidence of mesenteric injury.  Vascular/Lymphatic: 12 aorta is normal caliber without traumatic injury. No abdominal pelvic lymphadenopathy.  Reproductive: Prostate gland is normal.  Musculoskeletal: No pelvic fracture or sacral fracture. No spine fracture. The  IMPRESSION: Chest Impression:  1. Fracture of the left lateral sixth and seventh ribs. No associated pneumothorax. 2. Incomplete imaging of proximal left humerus fracture. 3. No evidence of mediastinal injury. Aberrant right subclavian artery.  Abdomen / Pelvis Impression:  1. No evidence of traumatic injury within the abdomen or pelvis. 2. Post splenectomy with some splenic regeneration. 3. Benign left adrenal adenoma.  Nonobstructing left renal calculus. 4. No evidence of pelvic fracture or spine fracture.   Electronically Signed   By: Suzy Bouchard M.D.   On: 02/17/2015 12:47   Ct Abdomen Pelvis W Contrast  02/17/2015   CLINICAL DATA:  Trauma, fall from porch. Rib and chest pain. Left arm pain. Short of breath.  EXAM: CT CHEST, ABDOMEN, AND PELVIS WITH CONTRAST  TECHNIQUE: Multidetector CT imaging of the chest, abdomen and pelvis was performed following the standard protocol during bolus administration of intravenous contrast.  CONTRAST:  151m OMNIPAQUE IOHEXOL 300 MG/ML  SOLN  COMPARISON:  Chest radiograph 10/25/2014  FINDINGS: CT CHEST FINDINGS  Mediastinum/Nodes: No contour abnormality aorta to suggest a dissection or transsection. Great vessels are normal. Incidental note of the aberrant right subclavian artery coursing behind the esophagus. No pericardial fluid. Esophagus is normal. No mediastinal hematoma.  Lungs/Pleura: Prominent azygos fissure and azygos vein. Mild bibasilar  atelectasis present. No pulmonary contusion or pleural fluid. No pneumothorax.  Musculoskeletal: Minimally displaced fractures of the sixth and seventh left lateral ribs (image 36 and 48 of series 3. There is a fracture of the proximal left humerus which is incompletely imaged.  CT ABDOMEN AND PELVIS FINDINGS  Hepatobiliary: No evidence of solid organ injury to the liver. Gallbladder normal.  Pancreas: No pancreatic injury.  Spleen: Post splenectomy. There several small splenules in the left upper quadrant which are normal  Adrenals/Urinary Tract: The small nodule of the left adrenal gland which measures 16 mm. This has washout characteristics (greater than 40%) which there is consistent with benign adenoma. Kidneys are normal. There is a nonobstructing calculus in the left kidney. No ureteral or renal injury. The bladder is intact.  Stomach/Bowel: Stomach, small bowel, and colon are unremarkable. No evidence of mesenteric injury.  Vascular/Lymphatic: 12 aorta is normal caliber without traumatic injury. No abdominal pelvic lymphadenopathy.  Reproductive: Prostate gland is normal.  Musculoskeletal: No pelvic fracture or sacral fracture. No spine fracture. The  IMPRESSION: Chest Impression:  1. Fracture of the left lateral sixth and seventh ribs. No associated pneumothorax. 2. Incomplete imaging of proximal left humerus fracture. 3. No evidence of mediastinal injury. Aberrant right subclavian artery.  Abdomen / Pelvis Impression:  1. No evidence of traumatic injury within the abdomen or pelvis. 2. Post splenectomy  with some splenic regeneration. 3. Benign left adrenal adenoma.  Nonobstructing left renal calculus. 4. No evidence of pelvic fracture or spine fracture.   Electronically Signed   By: Suzy Bouchard M.D.   On: 02/17/2015 12:47   Dg Humerus Left  02/17/2015   CLINICAL DATA:  Fall down stairs  EXAM: LEFT HUMERUS - 2+ VIEW  COMPARISON:  None.  FINDINGS: Comminuted, segmental, displaced/angulated fracture  of the mid humerus.  Fracture appears to extends into the humeral head/neck, nondisplaced.  IMPRESSION: Comminuted fracture involving the proximal humerus and mid humeral shaft, as described above.   Electronically Signed   By: Julian Hy M.D.   On: 02/17/2015 11:22    Positive ROS: All other systems have been reviewed and were otherwise negative with the exception of those mentioned in the HPI and as above.  Labs cbc  Recent Labs  02/17/15 1142 02/17/15 1153  WBC 14.3*  --   HGB 14.1 16.3  HCT 41.2 48.0  PLT 218  --     Labs inflam No results for input(s): CRP in the last 72 hours.  Invalid input(s): ESR  Labs coag  Recent Labs  02/17/15 1142  INR 1.19     Recent Labs  02/17/15 1142 02/17/15 1153  NA 139 143  K 4.1 4.0  CL 108 105  CO2 23  --   GLUCOSE 129* 126*  BUN 16 18  CREATININE 0.86 1.00  CALCIUM 8.0*  --     Physical Exam: Filed Vitals:   02/17/15 1300  BP: 119/79  Pulse: 77  Temp:   Resp: 17   General: Alert, no acute distress Cardiovascular: No pedal edema Respiratory: No cyanosis, no use of accessory musculature GI: No organomegaly, abdomen is soft and non-tender Skin: No lesions in the area of chief complaint other than those listed below in MSK exam.  Neurologic: Sensation intact distally Psychiatric: Patient is competent for consent with normal mood and affect Lymphatic: No axillary or cervical lymphadenopathy  MUSCULOSKELETAL:  LUE: pain with ROM, SILT M/R/U nerve, 2+ radial pulse, +EPL/FPL/IO Compartments soft  Other extremities are atraumatic with painless ROM and NVI.  Assessment: Left long spiral humerus fracture of the proximal humerus extending into the diaphysis  Plan: Sling for now, plan for ORIF as an outpatient.  NWB  F/u in my clinic tomorrow am for further evaluation   Edmonia Lynch, D, MD Cell [phone]   02/17/2015 1:12 PM

## 2015-02-17 NOTE — ED Notes (Signed)
Ortho tech to bedside for splint application

## 2015-02-17 NOTE — ED Notes (Signed)
Pt has returned from ct

## 2015-02-17 NOTE — ED Notes (Signed)
Ortho at bedside.

## 2015-02-17 NOTE — ED Notes (Signed)
Patient transported to CT 

## 2015-02-17 NOTE — Discharge Instructions (Signed)
Humerus Fracture, Treated with Open Reduction You have a fracture (break in bone) of your humerus. This is the large bone in your upper arm. These fractures are easily diagnosed with x-rays. TREATMENT  Simple fractures that will heal without disability are treated with simple immobilization. This is often followed with early range of motion exercises to keep the shoulder from becoming frozen (stuck in one position). Your caregiver feels you have an unstable fracture. Unstable fractures are treated with an open reduction (operation) and internal fixation. A screw is put into the fracture to hold the bone pieces in position while they heal. This is done to obtain the best possible result for shoulder function. These fixation devices are often left in place after healing. They generally do not cause problems and you usually will not know they are there. RELATED COMPLICATIONS The most common complication of upper arm fractures is adhesive capsulitis. This means the shoulder is frozen or difficult to move. Other complications include infection, non-union or malunion of the bones. This means the bones do not heal in the correct position or direction. BEFORE AND AFTER YOUR SURGERY Prior to surgery, an IV (intravenous line connected to your vein for giving fluids) may be started. You will be given an anesthetic (medications and gas to make you sleep). After surgery, you will be taken to the recovery area where a nurse will watch your progress. You may have a catheter (a long, narrow, hollow tube) in your bladder that helps you pass your water. Once you're awake, stable, and taking fluids well, you'll be returned to your room. You will receive physical therapy and other care until you are doing well and your caregiver feels it is safe for you to be transferred either to home or to an extended care facility. HOME CARE INSTRUCTIONS  You may resume normal diet and activities as directed or allowed.  Change dressings  if necessary or as instructed.  Only take over-the-counter or prescription medicines for pain, discomfort, or fever as directed by your caregiver. SEEK IMMEDIATE MEDICAL CARE IF:   There is redness, swelling, or increasing pain in the wound.  There is pus coming from wound.  An unexplained oral temperature above 102 F (38.9 C) develops, or as your caregiver suggests.  A bad smell is coming from the wound or dressing.  The edges of the wound are not staying together after sutures or staples have been removed. MAKE SURE YOU:   Understand these instructions.  Will watch your condition.  Will get help right away if you are not doing well or get worse. Document Released: 06/02/2001 Document Revised: 02/29/2012 Document Reviewed: 07/27/2008 [facility] Patient Information 2015 [place], [place]. This information is not intended to replace advice given to you by your health care provider. Make sure you discuss any questions you have with your health care provider.  Rib Fracture A rib fracture is a break or crack in one of the bones of the ribs. The ribs are a group of long, curved bones that wrap around your chest and attach to your spine. They protect your lungs and other organs in the chest cavity. A broken or cracked rib is often painful, but most do not cause other problems. Most rib fractures heal on their own over time. However, rib fractures can be more serious if multiple ribs are broken or if broken ribs move out of place and push against other structures. CAUSES   A direct blow to the chest. For example, this could happen during  contact sports, a car accident, or a fall against a hard object.  Repetitive movements with high force, such as pitching a baseball or having severe coughing spells. SYMPTOMS   Pain when you breathe in or cough.  Pain when someone presses on the injured area. DIAGNOSIS  Your caregiver will perform a physical exam. Various imaging tests may be ordered to  confirm the diagnosis and to look for related injuries. These tests may include a chest X-ray, computed tomography (CT), magnetic resonance imaging (MRI), or a bone scan. TREATMENT  Rib fractures usually heal on their own in 1-3 months. The longer healing period is often associated with a continued cough or other aggravating activities. During the healing period, pain control is very important. Medication is usually given to control pain. Hospitalization or surgery may be needed for more severe injuries, such as those in which multiple ribs are broken or the ribs have moved out of place.  HOME CARE INSTRUCTIONS   Avoid strenuous activity and any activities or movements that cause pain. Be careful during activities and avoid bumping the injured rib.  Gradually increase activity as directed by your caregiver.  Only take over-the-counter or prescription medications as directed by your caregiver. Do not take other medications without asking your caregiver first.  Apply ice to the injured area for the first 1-2 days after you have been treated or as directed by your caregiver. Applying ice helps to reduce inflammation and pain.  Put ice in a plastic bag.  Place a towel between your skin and the bag.   Leave the ice on for 15-20 minutes at a time, every 2 hours while you are awake.  Perform deep breathing as directed by your caregiver. This will help prevent pneumonia, which is a common complication of a broken rib. Your caregiver may instruct you to:  Take deep breaths several times a day.  Try to cough several times a day, holding a pillow against the injured area.  Use a device called an incentive spirometer to practice deep breathing several times a day.  Drink enough fluids to keep your urine clear or pale yellow. This will help you avoid constipation.   Do not wear a rib belt or binder. These restrict breathing, which can lead to pneumonia.  SEEK IMMEDIATE MEDICAL CARE IF:   You  have a fever.   You have difficulty breathing or shortness of breath.   You develop a continual cough, or you cough up thick or bloody sputum.  You feel sick to your stomach (nausea), throw up (vomit), or have abdominal pain.   You have worsening pain not controlled with medications.  MAKE SURE YOU:  Understand these instructions.  Will watch your condition.  Will get help right away if you are not doing well or get worse. Document Released: 12/07/2005 Document Revised: 08/09/2013 Document Reviewed: 02/08/2013 [facility] Patient Information 2015 [place], [place]. This information is not intended to replace advice given to you by your health care provider. Make sure you discuss any questions you have with your health care provider.

## 2015-10-22 ENCOUNTER — Ambulatory Visit (INDEPENDENT_AMBULATORY_CARE_PROVIDER_SITE_OTHER): Payer: BLUE CROSS/BLUE SHIELD | Admitting: Internal Medicine

## 2015-10-22 ENCOUNTER — Encounter: Payer: Self-pay | Admitting: Internal Medicine

## 2015-10-22 VITALS — BP 136/86 | HR 64 | Temp 97.9°F | Resp 16 | Ht 74.0 in | Wt 276.6 lb

## 2015-10-22 DIAGNOSIS — R5383 Other fatigue: Secondary | ICD-10-CM

## 2015-10-22 DIAGNOSIS — M169 Osteoarthritis of hip, unspecified: Secondary | ICD-10-CM | POA: Insufficient documentation

## 2015-10-22 DIAGNOSIS — Z0001 Encounter for general adult medical examination with abnormal findings: Secondary | ICD-10-CM

## 2015-10-22 DIAGNOSIS — Z0000 Encounter for general adult medical examination without abnormal findings: Secondary | ICD-10-CM | POA: Diagnosis not present

## 2015-10-22 DIAGNOSIS — Z79899 Other long term (current) drug therapy: Secondary | ICD-10-CM

## 2015-10-22 DIAGNOSIS — Z125 Encounter for screening for malignant neoplasm of prostate: Secondary | ICD-10-CM

## 2015-10-22 DIAGNOSIS — E559 Vitamin D deficiency, unspecified: Secondary | ICD-10-CM

## 2015-10-22 DIAGNOSIS — Z1212 Encounter for screening for malignant neoplasm of rectum: Secondary | ICD-10-CM

## 2015-10-22 DIAGNOSIS — I10 Essential (primary) hypertension: Secondary | ICD-10-CM

## 2015-10-22 DIAGNOSIS — R7303 Prediabetes: Secondary | ICD-10-CM

## 2015-10-22 DIAGNOSIS — M160 Bilateral primary osteoarthritis of hip: Secondary | ICD-10-CM

## 2015-10-22 DIAGNOSIS — Z13220 Encounter for screening for lipoid disorders: Secondary | ICD-10-CM | POA: Insufficient documentation

## 2015-10-22 DIAGNOSIS — Z6835 Body mass index (BMI) 35.0-35.9, adult: Secondary | ICD-10-CM | POA: Insufficient documentation

## 2015-10-22 DIAGNOSIS — E349 Endocrine disorder, unspecified: Secondary | ICD-10-CM

## 2015-10-22 LAB — BASIC METABOLIC PANEL WITH GFR
BUN: 17 mg/dL (ref 7–25)
CALCIUM: 8.9 mg/dL (ref 8.6–10.3)
CO2: 31 mmol/L (ref 20–31)
CREATININE: 0.96 mg/dL (ref 0.70–1.33)
Chloride: 105 mmol/L (ref 98–110)
GLUCOSE: 117 mg/dL — AB (ref 65–99)
Potassium: 4.3 mmol/L (ref 3.5–5.3)
SODIUM: 138 mmol/L (ref 135–146)

## 2015-10-22 LAB — LIPID PANEL
CHOLESTEROL: 107 mg/dL — AB (ref 125–200)
HDL: 55 mg/dL (ref >=40)
LDL Cholesterol: 41 mg/dL (ref <130)
TRIGLYCERIDES: 56 mg/dL (ref <150)
Total CHOL/HDL Ratio: 1.9 Ratio (ref <=5.0)
VLDL: 11 mg/dL (ref <30)

## 2015-10-22 LAB — CBC WITH DIFFERENTIAL/PLATELET
BASOS PCT: 1 % (ref 0–1)
Basophils Absolute: 0.1 10*3/uL (ref 0.0–0.1)
EOS PCT: 3 % (ref 0–5)
Eosinophils Absolute: 0.3 10*3/uL (ref 0.0–0.7)
HEMATOCRIT: 46.8 % (ref 39.0–52.0)
HEMOGLOBIN: 15.8 g/dL (ref 13.0–17.0)
Lymphocytes Relative: 26 % (ref 12–46)
Lymphs Abs: 2.2 10*3/uL (ref 0.7–4.0)
MCH: 34.9 pg — AB (ref 26.0–34.0)
MCHC: 33.8 g/dL (ref 30.0–36.0)
MCV: 103.3 fL — ABNORMAL HIGH (ref 78.0–100.0)
MONO ABS: 0.9 10*3/uL (ref 0.1–1.0)
MONOS PCT: 10 % (ref 3–12)
MPV: 10.2 fL (ref 8.6–12.4)
Neutro Abs: 5.1 10*3/uL (ref 1.7–7.7)
Neutrophils Relative %: 60 % (ref 43–77)
Platelets: 275 10*3/uL (ref 150–400)
RBC: 4.53 MIL/uL (ref 4.22–5.81)
RDW: 14.1 % (ref 11.5–15.5)
WBC: 8.5 10*3/uL (ref 4.0–10.5)

## 2015-10-22 LAB — HEPATIC FUNCTION PANEL
ALT: 15 U/L (ref 9–46)
AST: 15 U/L (ref 10–35)
Albumin: 4 g/dL (ref 3.6–5.1)
Alkaline Phosphatase: 96 U/L (ref 40–115)
BILIRUBIN INDIRECT: 0.6 mg/dL (ref 0.2–1.2)
Bilirubin, Direct: 0.2 mg/dL (ref <=0.2)
TOTAL PROTEIN: 6.6 g/dL (ref 6.1–8.1)
Total Bilirubin: 0.8 mg/dL (ref 0.2–1.2)

## 2015-10-22 LAB — IRON AND TIBC
%SAT: 53 % (ref 15–60)
Iron: 168 ug/dL (ref 50–180)
TIBC: 319 ug/dL (ref 250–425)
UIBC: 151 ug/dL (ref 125–400)

## 2015-10-22 LAB — MAGNESIUM: MAGNESIUM: 1.8 mg/dL (ref 1.5–2.5)

## 2015-10-22 NOTE — Patient Instructions (Signed)
Recommend Adult Low Dose Aspirin or   coated  Aspirin 81 mg daily   To reduce risk of Colon Cancer 20 %,   Skin Cancer 26 % ,   Melanoma 46%   and   Pancreatic cancer 60%   ++++++++++++++++++++++++++++++++++++++++++++++++++++++  Vitamin D goal   is between 70-100.   Please make sure that you are taking your Vitamin D as directed.   It is very important as a natural anti-inflammatory   helping hair, skin, and nails, as well as reducing stroke and heart attack risk.   It helps your bones and helps with mood.  It also decreases numerous cancer risks so please take it as directed.   Low Vit D is associated with a 200-300% higher risk for CANCER   and 200-300% higher risk for HEART   ATTACK  &  STROKE.   ......................................  It is also associated with higher death rate at younger ages,   autoimmune diseases like Rheumatoid arthritis, Lupus, Multiple Sclerosis.     Also many other serious conditions, like depression, Alzheimer's  Dementia, infertility, muscle aches, fatigue, fibromyalgia - just to name a few.  ++++++++++++++++++++++++++++++++++++++++++++++++  Recommend the book "The END of DIETING" by Dr Joel Fuhrman   & the book "The END of DIABETES " by Dr Joel Fuhrman  At [employer] - get book & Audio CD's     Being diabetic has a  300% increased risk for heart attack, stroke, cancer, and alzheimer- type vascular dementia. It is very important that you work harder with diet by avoiding all foods that are white. Avoid white rice (brown & wild rice is OK), white potatoes (sweetpotatoes in moderation is OK), White bread or wheat bread or anything made out of white flour like bagels, donuts, rolls, buns, biscuits, cakes, pastries, cookies, pizza crust, and pasta (made from white flour & egg whites) - vegetarian pasta or spinach or wheat pasta is OK. Multigrain breads like Arnold's or [employer], or multigrain sandwich thins or flatbreads.  Diet,  exercise and weight loss can reverse and cure diabetes in the early stages.  Diet, exercise and weight loss is very important in the control and prevention of complications of diabetes which affects every system in your body, ie. Brain - dementia/stroke, eyes - glaucoma/blindness, heart - heart attack/heart failure, kidneys - dialysis, stomach - gastric paralysis, intestines - malabsorption, nerves - severe painful neuritis, circulation - gangrene & loss of a leg(s), and finally cancer and Alzheimers.    I recommend avoid fried & greasy foods,  sweets/candy, white rice (brown or wild rice or Quinoa is OK), white potatoes (sweet potatoes are OK) - anything made from white flour - bagels, doughnuts, rolls, buns, biscuits,white and wheat breads, pizza crust and traditional pasta made of white flour & egg white(vegetarian pasta or spinach or wheat pasta is OK).  Multi-grain bread is OK - like multi-grain flat bread or sandwich thins. Avoid alcohol in excess. Exercise is also important.    Eat all the vegetables you want - avoid meat, especially red meat and dairy - especially cheese.  Cheese is the most concentrated form of trans-fats which is the worst thing to clog up our arteries. Veggie cheese is OK which can be found in the fresh produce section at [employer] or [employer] or [employer]  ++++++++++++++++++++++++++++++++++++++++++++++++++ DASH Eating Plan  DASH stands for "Dietary Approaches to Stop Hypertension."   The DASH eating plan is a healthy eating plan that has been shown to reduce high   blood pressure (hypertension). Additional health benefits [place] include reducing the risk of type 2 diabetes mellitus, heart disease, and stroke. The DASH eating plan Poet also help with weight loss.  WHAT DO I NEED TO KNOW ABOUT THE DASH EATING PLAN? For the DASH eating plan, you will follow these general guidelines:  Choose foods with a percent daily value for sodium of less than 5% (as listed on the food  label).  Use salt-free seasonings or herbs instead of table salt or sea salt.  Check with your health care provider or pharmacist before using salt substitutes.  Eat lower-sodium products, often labeled as "lower sodium" or "no salt added."  Eat fresh foods.  Eat more vegetables, fruits, and low-fat dairy products.    Choose whole grains. Look for the word "whole" as the first word in the ingredient list.  Choose fish   Limit sweets, desserts, sugars, and sugary drinks.  Choose heart-healthy fats.  Eat veggie cheese   Eat more home-cooked food and less restaurant, buffet, and fast food.  Limit fried foods.  Cook foods using methods other than frying.  Limit canned vegetables. If you do use them, rinse them well to decrease the sodium.  When eating at a restaurant, ask that your food be prepared with less salt, or no salt if possible.                      WHAT FOODS CAN I EAT?  Seek help from a dietitian for individual calorie needs. Grains Whole grain or whole wheat bread. Brown rice. Whole grain or whole wheat pasta. Quinoa, bulgur, and whole grain cereals. Low-sodium cereals. Corn or whole wheat flour tortillas. Whole grain cornbread. Whole grain crackers. Low-sodium crackers.  Vegetables Fresh or frozen vegetables (raw, steamed, roasted, or grilled). Low-sodium or reduced-sodium tomato and vegetable juices. Low-sodium or reduced-sodium tomato sauce and paste. Low-sodium or reduced-sodium canned vegetables.   Fruits All fresh, canned (in natural juice), or frozen fruits.  Meat and Other Protein Products  All fish and seafood.  Dried beans, peas, or lentils. Unsalted nuts and seeds. Unsalted canned beans. Dairy Low-fat dairy products, such as skim or 1% milk, 2% or reduced-fat cheeses, low-fat ricotta or cottage cheese, or plain low-fat yogurt. Low-sodium or reduced-sodium cheeses.  Fats and Oils Tub margarines without trans fats. Light or reduced-fat mayonnaise  and salad dressings (reduced sodium). Avocado. Safflower, olive, or canola oils. Natural peanut or almond butter.  Other Unsalted popcorn and pretzels. The items listed above [place] not be a complete list of recommended foods or beverages. Contact your dietitian for more options.  +++++++++++++++++++++++++++++++++++++++++++  WHAT FOODS ARE NOT RECOMMENDED?  Grains/ White flour or wheat flour  White bread. White pasta. White rice. Refined cornbread. Bagels and croissants. Crackers that contain trans fat.  Vegetables  Creamed or fried vegetables. Vegetables in a . Regular canned vegetables. Regular canned tomato sauce and paste. Regular tomato and vegetable juices.  Fruits Dried fruits. Canned fruit in light or heavy syrup. Fruit juice.  Meat and Other Protein Products Meat in general. Fatty cuts of meat. Ribs, chicken wings, bacon, sausage, bologna, salami, chitterlings, fatback, hot dogs, bratwurst, and packaged luncheon meats. Salted nuts and seeds. Canned beans with salt.  Dairy Whole or 2% milk, cream, half-and-half, and cream cheese. Whole-fat or sweetened yogurt. Full-fat cheeses or blue cheese. Nondairy creamers and whipped toppings. Processed cheese, cheese spreads, or cheese curds.  Condiments Onion and garlic salt, seasoned salt, table salt, and sea  salt. Canned and packaged gravies. Worcestershire sauce. Tartar sauce. Barbecue sauce. Teriyaki sauce. Soy sauce, including reduced sodium. Steak sauce. Fish sauce. Oyster sauce. Cocktail sauce. Horseradish. Ketchup and mustard. Meat flavorings and tenderizers. Bouillon cubes. Hot sauce. Tabasco sauce. Marinades. Taco seasonings. Relishes.  Fats and Oils Butter, stick margarine, lard, shortening, ghee, and bacon fat. Coconut, palm kernel, or palm oils. Regular salad dressings.  Pickles and olives. Salted popcorn and pretzels. The items listed above may not be a complete list of foods and beverages to avoid.   Preventive Care for  Adults  A healthy lifestyle and preventive care can promote health and wellness. Preventive health guidelines for men include the following key practices:  A routine yearly physical is a good way to check with your health care provider about your health and preventative screening. It is a chance to share any concerns and updates on your health and to receive a thorough exam.  Visit your dentist for a routine exam and preventative care every 6 months. Brush your teeth twice a day and floss once a day. Good oral hygiene prevents tooth decay and gum disease.  The frequency of eye exams is based on your age, health, family medical history, use of contact lenses, and other factors. Follow your health care provider's recommendations for frequency of eye exams.  Eat a healthy diet. Foods such as vegetables, fruits, whole grains, low-fat dairy products, and lean protein foods contain the nutrients you need without too many calories. Decrease your intake of foods high in solid fats, added sugars, and salt. Eat the right amount of calories for you.Get information about a proper diet from your health care provider, if necessary.  Regular physical exercise is one of the most important things you can do for your health. Most adults should get at least 150 minutes of moderate-intensity exercise (any activity that increases your heart rate and causes you to sweat) each week. In addition, most adults need muscle-strengthening exercises on 2 or more days a week.  Maintain a healthy weight. The body mass index (BMI) is a screening tool to identify possible weight problems. It provides an estimate of body fat based on height and weight. Your health care provider can find your BMI and can help you achieve or maintain a healthy weight.For adults 20 years and older:  A BMI below 18.5 is considered underweight.  A BMI of 18.5 to 24.9 is normal.  A BMI of 25 to 29.9 is considered overweight.  A BMI of 30 and above  is considered obese.  Maintain normal blood lipids and cholesterol levels by exercising and minimizing your intake of saturated fat. Eat a balanced diet with plenty of fruit and vegetables. Blood tests for lipids and cholesterol should begin at age 20 and be repeated every 5 years. If your lipid or cholesterol levels are high, you are over 50, or you are at high risk for heart disease, you may need your cholesterol levels checked more frequently.Ongoing high lipid and cholesterol levels should be treated with medicines if diet and exercise are not working.  If you smoke, find out from your health care provider how to quit. If you do not use tobacco, do not start.  Lung cancer screening is recommended for adults aged 55-80 years who are at high risk for developing lung cancer because of a history of smoking. A yearly low-dose CT scan of the lungs is recommended for people who have at least a 30-pack-year history of smoking and are a   current smoker or have quit within the past 15 years. A pack year of smoking is smoking an average of 1 pack of cigarettes a day for 1 year (for example: 1 pack a day for 30 years or 2 packs a day for 15 years). Yearly screening should continue until the smoker has stopped smoking for at least 15 years. Yearly screening should be stopped for people who develop a health problem that would prevent them from having lung cancer treatment.  If you choose to drink alcohol, do not have more than 2 drinks per day. One drink is considered to be 12 ounces (355 mL) of beer, 5 ounces (148 mL) of wine, or 1.5 ounces (44 mL) of liquor.  Avoid use of street drugs. Do not share needles with anyone. Ask for help if you need support or instructions about stopping the use of drugs.  High blood pressure causes heart disease and increases the risk of stroke. Your blood pressure should be checked at least every 1-2 years. Ongoing high blood pressure should be treated with medicines, if weight loss  and exercise are not effective.  If you are 67-15 years old, ask your health care provider if you should take aspirin to prevent heart disease.  Diabetes screening involves taking a blood sample to check your fasting blood sugar level. This should be done once every 3 years, after age 74, if you are within normal weight and without risk factors for diabetes. Testing should be considered at a younger age or be carried out more frequently if you are overweight and have at least 1 risk factor for diabetes.  Colorectal cancer can be detected and often prevented. Most routine colorectal cancer screening begins at the age of 42 and continues through age 75. However, your health care provider may recommend screening at an earlier age if you have risk factors for colon cancer. On a yearly basis, your health care provider may provide home test kits to check for hidden blood in the stool. Use of a small camera at the end of a tube to directly examine the colon (sigmoidoscopy or colonoscopy) can detect the earliest forms of colorectal cancer. Talk to your health care provider about this at age 62, when routine screening begins. Direct exam of the colon should be repeated every 5-10 years through age 7, unless early forms of precancerous polyps or small growths are found.   Talk with your health care provider about prostate cancer screening.  Testicular cancer screening isrecommended for adult males. Screening includes self-exam, a health care provider exam, and other screening tests. Consult with your health care provider about any symptoms you have or any concerns you have about testicular cancer.  Use sunscreen. Apply sunscreen liberally and repeatedly throughout the day. You should seek shade when your shadow is shorter than you. Protect yourself by wearing long sleeves, pants, a wide-brimmed hat, and sunglasses year round, whenever you are outdoors.  Once a month, do a whole-body skin exam, using a mirror to  look at the skin on your back. Tell your health care provider about new moles, moles that have irregular borders, moles that are larger than a pencil eraser, or moles that have changed in shape or color.  Stay current with required vaccines (immunizations).  Influenza vaccine. All adults should be immunized every year.  Tetanus, diphtheria, and acellular pertussis (Td, Tdap) vaccine. An adult who has not previously received Tdap or who does not know his vaccine status should receive 1 dose of  Tdap. This initial dose should be followed by tetanus and diphtheria toxoids (Td) booster doses every 10 years. Adults with an unknown or incomplete history of completing a 3-dose immunization series with Td-containing vaccines should begin or complete a primary immunization series including a Tdap dose. Adults should receive a Td booster every 10 years.  Varicella vaccine. An adult without evidence of immunity to varicella should receive 2 doses or a second dose if he has previously received 1 dose.  Human papillomavirus (HPV) vaccine. Males aged 53-21 years who have not received the vaccine previously should receive the 3-dose series. Males aged 22-26 years may be immunized. Immunization is recommended through the age of 52 years for any male who has sex with males and did not get any or all doses earlier. Immunization is recommended for any person with an immunocompromised condition through the age of 52 years if he did not get any or all doses earlier. During the 3-dose series, the second dose should be obtained 4-8 weeks after the first dose. The third dose should be obtained 24 weeks after the first dose and 16 weeks after the second dose.  Zoster vaccine. One dose is recommended for adults aged 7 years or older unless certain conditions are present.    PREVNAR  - Pneumococcal 13-valent conjugate (PCV13) vaccine. When indicated, a person who is uncertain of his immunization history and has no record of  immunization should receive the PCV13 vaccine. An adult aged 61 years or older who has certain medical conditions and has not been previously immunized should receive 1 dose of PCV13 vaccine. This PCV13 should be followed with a dose of pneumococcal polysaccharide (PPSV23) vaccine. The PPSV23 vaccine dose should be obtained at least 8 weeks after the dose of PCV13 vaccine. An adult aged 75 years or older who has certain medical conditions and previously received 1 or more doses of PPSV23 vaccine should receive 1 dose of PCV13. The PCV13 vaccine dose should be obtained 1 or more years after the last PPSV23 vaccine dose.    PNEUMOVAX - Pneumococcal polysaccharide (PPSV23) vaccine. When PCV13 is also indicated, PCV13 should be obtained first. All adults aged 20 years and older should be immunized. An adult younger than age 74 years who has certain medical conditions should be immunized. Any person who resides in a nursing home or long-term care facility should be immunized. An adult smoker should be immunized. People with an immunocompromised condition and certain other conditions should receive both PCV13 and PPSV23 vaccines. People with human immunodeficiency virus (HIV) infection should be immunized as soon as possible after diagnosis. Immunization during chemotherapy or radiation therapy should be avoided. Routine use of PPSV23 vaccine is not recommended for American Indians, [place]atives, or people younger than 65 years unless there are medical conditions that require PPSV23 vaccine. When indicated, people who have unknown immunization and have no record of immunization should receive PPSV23 vaccine. One-time revaccination 5 years after the first dose of PPSV23 is recommended for people aged 19-64 years who have chronic kidney failure, nephrotic syndrome, asplenia, or immunocompromised conditions. People who received 1-2 doses of PPSV23 before age 3 years should receive another dose of PPSV23 vaccine at age  22 years or later if at least 5 years have passed since the previous dose. Doses of PPSV23 are not needed for people immunized with PPSV23 at or after age 58 years.    Hepatitis A vaccine. Adults who wish to be protected from this disease, have certain high-risk conditions,  work with hepatitis A-infected animals, work in hepatitis A research labs, or travel to or work in countries with a high rate of hepatitis A should be immunized. Adults who were previously unvaccinated and who anticipate close contact with an international adoptee during the first 60 days after arrival in the [place] from a country with a high rate of hepatitis A should be immunized.    Hepatitis B vaccine. Adults should be immunized if they wish to be protected from this disease, have certain high-risk conditions, may be exposed to blood or other infectious body fluids, are household contacts or sex partners of hepatitis B positive people, are clients or workers in certain care facilities, or travel to or work in countries with a high rate of hepatitis B.   Preventive Service / Frequency   Ages 44 to 54  Blood pressure check.  Lipid and cholesterol check  Lung cancer screening. / Every year if you are aged 41-80 years and have a 30-pack-year history of smoking and currently smoke or have quit within the past 15 years. Yearly screening is stopped once you have quit smoking for at least 15 years or develop a health problem that would prevent you from having lung cancer treatment.  Fecal occult blood test (FOBT) of stool. / Every year beginning at age 20 and continuing until age 45. You may not have to do this test if you get a colonoscopy every 10 years.  Flexible sigmoidoscopy** or colonoscopy.** / Every 5 years for a flexible sigmoidoscopy or every 10 years for a colonoscopy beginning at age 43 and continuing until age 1. Screening for abdominal aortic aneurysm (AAA)  by ultrasound is recommended for people who  have history of high blood pressure or who are current or former smokers.

## 2015-10-22 NOTE — Progress Notes (Signed)
Patient ID: Erik Quinn, male   DOB: 1966/05/08, 49 y.o.   MRN: [id number]  Annual Preventative /Wellmess Visit And Comprehensive Evaluation, Examination & Management      This very nice 49 y.o. MWM presents for presents for a Wellness/Preventative Visit & comprehensive evaluation and management of multiple medical co-morbidities.  Patient has been followed for Labile HTN, Prediabetes, Hyperlipidemia and Vitamin D Deficiency.      Patient also is c/o left hip & thigh pains on a daily basis and chart review finds MRI showing moderately severe bilat DJD of hips. Pt rates the pain at 7/10 during the day crescending to 9/10 by day's end and only partially relieved by recumbency.       Patient has hx.o labile HTN predates since 2013 and has been followed expectantly. Patient's BP has been controlled at home.Today's BP: 136/86 mmHg. Patient denies any cardiac symptoms as chest pain, palpitations, shortness of breath, dizziness or ankle swelling.     Patient's hyperlipidemia is controlled with diet.  Last lipids were at goal with Cholesterol 101; HDL 55; LDL 31; & Triglycerides 74 on 10/24/2014.      Patient has Morbid Obesity (BMI 35.50) and consequent  prediabetes since 2011 with A1c 5.7% and in Nov 2014 his A1c 5.8% with elevated Insulin 46.  Patient denies reactive hypoglycemic symptoms, visual blurring, diabetic polys or paresthesias. Last A1c was 5.6% on 10/24/2014.     Patient has hx/o Low T 138 in Nov 2014 and as he's had no libido or performance issues he's declined replacement therapy. Finally, patient has history of Vitamin D Deficiency and last vitamin D was still low at 34 on 10/24/2014.      Medication Sig  . albuterol  HFA  inhaler Inhale 2 puffs into the lungs every 6 (six) hours as needed for wheezing or shortness of breath.  . NORCO 5-325 MG Take 1-2 tablets by mouth every 6 (six) hours as needed for moderate pain.  . naproxen  500 MG tablet Take 1 tablet (500 mg total) by mouth 2 (two)  times daily with a meal.    Allergies - None  Past Medical History  Diagnosis Date  . Hypertension   . Prediabetes    Health Maintenance  Topic Date Due  . HIV Screening  08/03/1980  . INFLUENZA VACCINE  07/22/2015  . COLONOSCOPY  08/04/2015  . TETANUS/TDAP  10/21/2023   Immunization History  Administered Date(s) Administered  . Influenza Split 10/24/2014  . PPD Test 10/24/2014  . Pneumococcal Conjugate-13 10/24/2014  . Pneumococcal-Unspecified 10/20/2013  . Tdap 10/20/2013   Past Surgical History  Procedure Laterality Date  . Splenectomy  1994   Family History  Problem Relation Age of Onset  . Diabetes Mother   . Sleep apnea Mother    Social History   Social History  . Marital Status: Married    Spouse Name: N/A  . Number of Children: N/A  . Years of Education: N/A   Occupational History  . Not on file.   Social History Main Topics  . Smoking status: Current Every Day Smoker -- 0.33 packs/day    Types: Cigarettes  . Smokeless tobacco: Never Used  . Alcohol Use: 4.8 oz/week    8 Standard drinks or equivalent per week  . Drug Use: No  . Sexual Activity: Not on file   Other Topics Concern  . Not on file   Social History Narrative    ROS Constitutional: Denies fever, chills, weight loss/gain, headaches, insomnia,  night sweats or change in appetite. Does c/o fatigue. Eyes: Denies redness, blurred vision, diplopia, discharge, itchy or watery eyes.  ENT: Denies discharge, congestion, post nasal drip, epistaxis, sore throat, earache, hearing loss, dental pain, Tinnitus, Vertigo, Sinus pain or snoring.  Cardio: Denies chest pain, palpitations, irregular heartbeat, syncope, dyspnea, diaphoresis, orthopnea, PND, claudication or edema Respiratory: denies cough, dyspnea, DOE, pleurisy, hoarseness, laryngitis or wheezing.  Gastrointestinal: Denies dysphagia, heartburn, reflux, water brash, pain, cramps, nausea, vomiting, bloating, diarrhea, constipation,  hematemesis, melena, hematochezia, jaundice or hemorrhoids Genitourinary: Denies dysuria, frequency, urgency, nocturia, hesitancy, discharge, hematuria or flank pain Musculoskeletal: Denies arthralgia, myalgia, stiffness, Jt. Swelling, pain, limp or strain/sprain. Denies Falls. Skin: Denies puritis, rash, hives, warts, acne, eczema or change in skin lesion Neuro: No weakness, tremor, incoordination, spasms, paresthesia or pain Psychiatric: Denies confusion, memory loss or sensory loss. Denies Depression. Endocrine: Denies change in weight, skin, hair change, nocturia, and paresthesia, diabetic polys, visual blurring or hyper / hypo glycemic episodes.  Heme/Lymph: No excessive bleeding, bruising or enlarged lymph nodes.  Physical Exam  BP 136/86 mmHg  Pulse 64  Temp(Src) 97.9 F (36.6 C)  Resp 16  Ht 6\' 2"  (1.88 m)  Wt 276 lb 9.6 oz (125.465 kg)  BMI 35.50 kg/m2  General Appearance: Well nourished &  in no apparent distress. Eyes: PERRLA, EOMs, conjunctiva no swelling or erythema, normal fundi and vessels. Sinuses: No frontal/maxillary tenderness ENT/Mouth: EACs patent / TMs  nl. Nares clear without erythema, swelling, mucoid exudates. Oral hygiene is good. No erythema, swelling, or exudate. Tongue normal, non-obstructing. Tonsils not swollen or erythematous. Hearing normal.  Neck: Supple, thyroid normal. No bruits, nodes or JVD. Respiratory: Respiratory effort normal.  BS equal and clear bilateral without rales, rhonci, wheezing or stridor. Cardio: Heart sounds are normal with regular rate and rhythm and no murmurs, rubs or gallops. Peripheral pulses are normal and equal bilaterally without edema. No aortic or femoral bruits. Chest: symmetric with normal excursions and percussion.  Abdomen: Flat, soft, with bowel sounds. Nontender, no guarding, rebound, hernias, masses, or organomegaly.  Lymphatics: Non tender without lymphadenopathy.  Genitourinary: No hernias.Testes nl. DRE - prostate  nl for age - smooth & firm w/o nodules. Musculoskeletal: Pain with int/ext rotation of the left hip and with flex/extension. Antalgic limp favoring the LLE. Skin: Warm and dry without rashes, lesions, cyanosis, clubbing or  ecchymosis.  Neuro: Cranial nerves intact, reflexes equal bilaterally. Normal muscle tone, no cerebellar symptoms. Sensation intact.  Pysch: Alert and oriented X 3 with normal affect, insight and judgment appropriate.   Assessment and Plan  1. Encounter for general adult medical examination with abnormal findings  - Microalbumin / creatinine urine ratio - EKG 12-Lead - [place], RETROPERITNL ABD,  LTD - POC Hemoccult Bld/Stl  - Vitamin B12 - PSA - Testosterone - Iron and TIBC - Urinalysis, Routine w reflex microscopic  - CBC with Differential/Platelet - BASIC METABOLIC PANEL WITH GFR - Hepatic function panel - Magnesium - Lipid panel - TSH - Hemoglobin A1c - Insulin, random - Vit D  25 hydroxy   2. Essential hypertension  - Microalbumin / creatinine urine ratio - EKG 12-Lead - [place], RETROPERITNL ABD,  LTD - TSH  3. Screening cholesterol level  - Lipid panel  4. Prediabetes  - Hemoglobin A1c - Insulin, random  5. Vitamin D deficiency  - Vit D  25 hydroxy   6. Screening for rectal cancer  - POC Hemoccult Bld/Stl   7. Prostate cancer screening  - PSA  8. Testosterone deficiency  - Testosterone  9. Osteoarthritis of both hips  - Patient is encouraged to schedule OV with his Orthopedists - Dr's murphy & Noemi Chapel.   10. Other fatigue  - Vitamin B12 - Testosterone - Iron and TIBC - TSH  11. Medication management  - Urinalysis, Routine w reflex microscopic  - CBC with Differential/Platelet - BASIC METABOLIC PANEL WITH GFR - Hepatic function panel - Magnesium   Continue prudent diet as discussed, weight control, BP monitoring, regular exercise, and medications as discussed.  Discussed med effects and SE's. Routine screening labs and  tests as requested with regular follow-up as recommended.  Over 40 minutes of exam, counseling &  chart review was performed

## 2015-10-23 LAB — URINALYSIS, ROUTINE W REFLEX MICROSCOPIC
BILIRUBIN URINE: NEGATIVE
GLUCOSE, UA: NEGATIVE
Hgb urine dipstick: NEGATIVE
KETONES UR: NEGATIVE
Leukocytes, UA: NEGATIVE
Nitrite: NEGATIVE
PROTEIN: NEGATIVE
Specific Gravity, Urine: 1.024 (ref 1.001–1.035)
pH: 6.5 (ref 5.0–8.0)

## 2015-10-23 LAB — TSH: TSH: 1.373 u[IU]/mL (ref 0.350–4.500)

## 2015-10-23 LAB — VITAMIN B12: Vitamin B-12: 303 pg/mL (ref 211–911)

## 2015-10-23 LAB — INSULIN, RANDOM: INSULIN: 11.6 u[IU]/mL (ref 2.0–19.6)

## 2015-10-23 LAB — TESTOSTERONE: TESTOSTERONE: 236 ng/dL — AB (ref 300–890)

## 2015-10-23 LAB — MICROALBUMIN / CREATININE URINE RATIO
Creatinine, Urine: 200 mg/dL (ref 20–370)
MICROALB/CREAT RATIO: 4 ug/mg{creat} (ref <30)
Microalb, Ur: 0.8 mg/dL

## 2015-10-23 LAB — HEMOGLOBIN A1C
HEMOGLOBIN A1C: 5.6 % (ref <5.7)
MEAN PLASMA GLUCOSE: 114 mg/dL (ref <117)

## 2015-10-23 LAB — PSA: PSA: 0.33 ng/mL (ref <=4.00)

## 2015-10-23 LAB — VITAMIN D 25 HYDROXY (VIT D DEFICIENCY, FRACTURES): VIT D 25 HYDROXY: 31 ng/mL (ref 30–100)

## 2015-10-30 ENCOUNTER — Encounter: Payer: Self-pay | Admitting: Internal Medicine

## 2015-11-04 ENCOUNTER — Other Ambulatory Visit: Payer: Self-pay | Admitting: *Deleted

## 2015-11-04 DIAGNOSIS — Z0001 Encounter for general adult medical examination with abnormal findings: Secondary | ICD-10-CM

## 2015-11-04 DIAGNOSIS — Z1212 Encounter for screening for malignant neoplasm of rectum: Secondary | ICD-10-CM

## 2015-11-04 LAB — POC HEMOCCULT BLD/STL (HOME/3-CARD/SCREEN)
Card #2 Fecal Occult Blod, POC: NEGATIVE
Card #3 Fecal Occult Blood, POC: NEGATIVE
FECAL OCCULT BLD: NEGATIVE

## 2015-11-28 ENCOUNTER — Ambulatory Visit: Payer: BLUE CROSS/BLUE SHIELD | Admitting: Internal Medicine

## 2015-11-28 ENCOUNTER — Encounter: Payer: Self-pay | Admitting: Internal Medicine

## 2015-11-28 VITALS — BP 130/88 | HR 72 | Temp 97.3°F | Resp 16 | Ht 74.0 in | Wt 272.0 lb

## 2015-11-28 DIAGNOSIS — IMO0001 Reserved for inherently not codable concepts without codable children: Secondary | ICD-10-CM

## 2015-11-30 NOTE — Progress Notes (Deleted)
  Subjective:    Patient ID: Erik Quinn, male    DOB: 1966/09/30, 49 y.o.   MRN: [id number]  HPI    Review of Systems     Objective:   Physical Exam        Assessment & Plan:

## 2015-11-30 NOTE — Progress Notes (Signed)
Patient ID: Erik Quinn, male   DOB: 04/15/1966, 49 y.o.   MRN: [id number]  Skin lesion of R flank to be removed has "fallen off " in the interim   since last OV and suspect skin site appears Normal.

## 2015-12-11 IMAGING — CR DG CHEST 1V
1 series · 1 of 1 positions shown · non-contrast
Comparison: Prior chest x-ray 10/25/2014

CLINICAL DATA: 48-year-old male status post fall downstairs

EXAM:
CHEST  1 VIEW

[x chest ap]
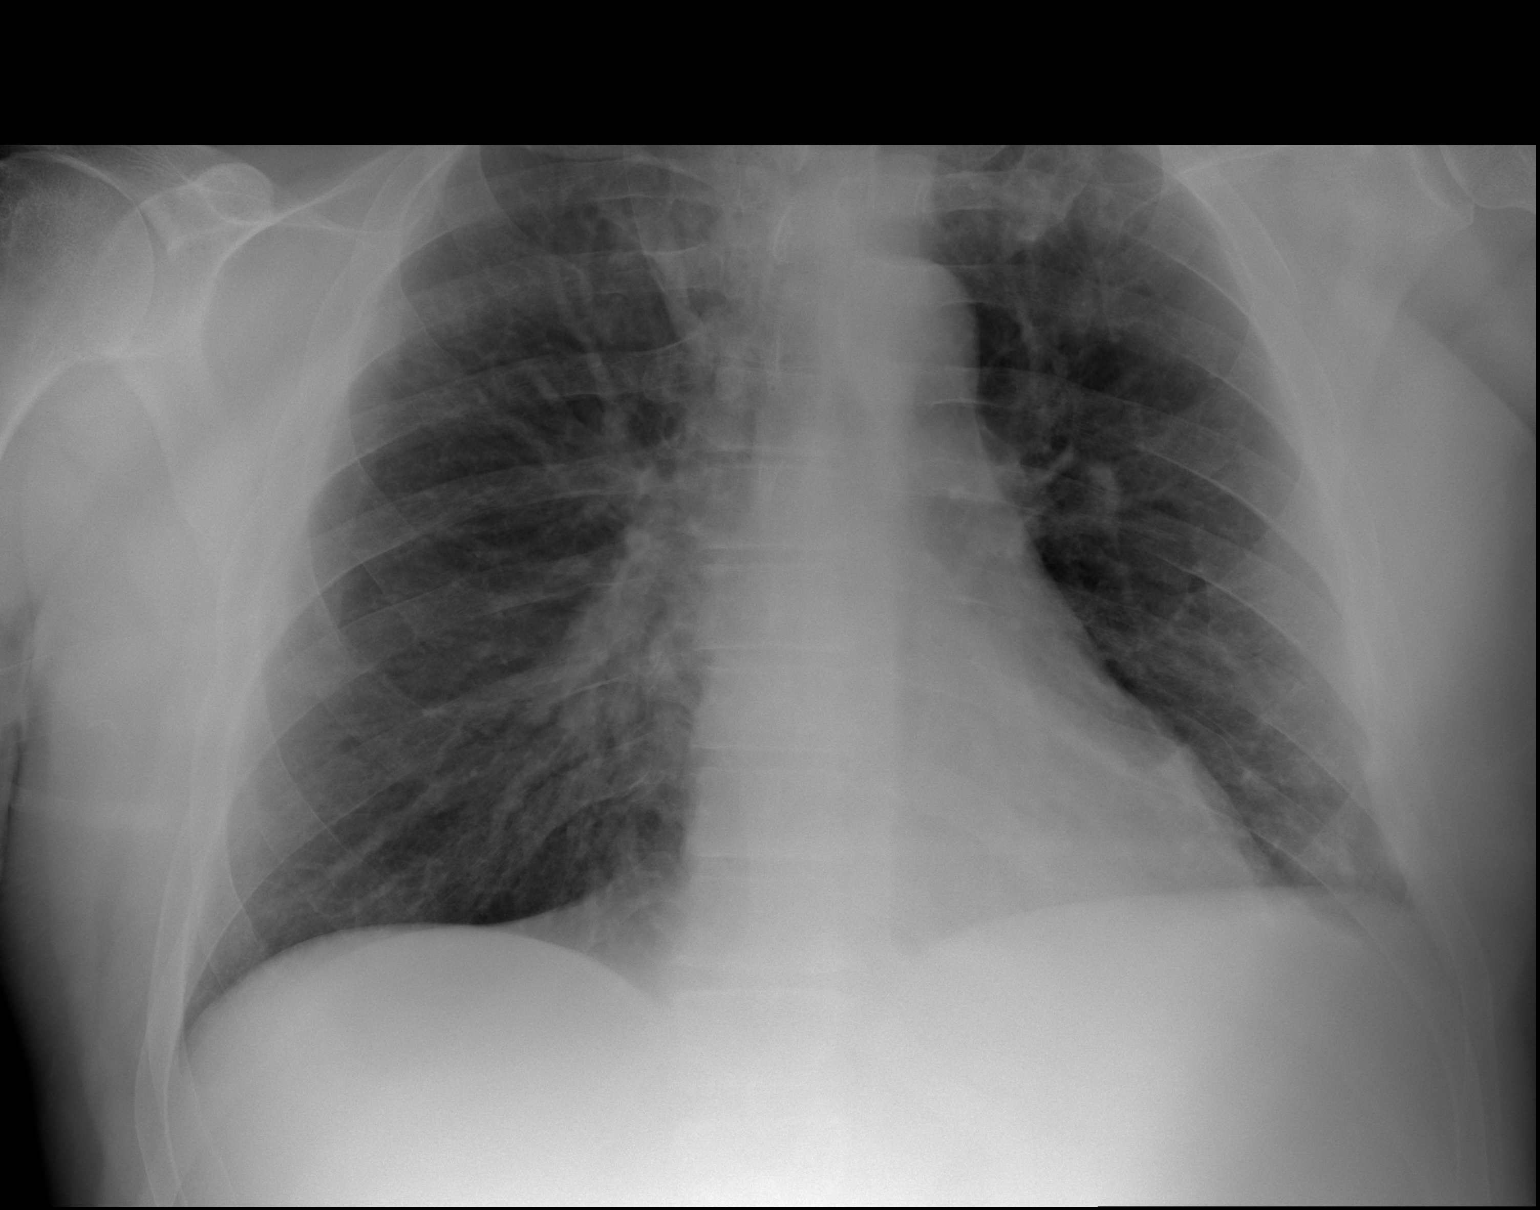

[1 of 1 positions shown; findings below may reference images not displayed]

FINDINGS: Cardiac and mediastinal contours are within normal limits. There may
be minimal pulmonary vascular congestion with slight cephalization.
No evidence of pneumothorax. Incidental note is made of an azygos
fissure. There is an acute displaced fracture of the lateral aspect
of the left seventh rib. Slight thickening of the underlying pleura
consistent with subpleural edema or hematoma. No evidence of hemo
thorax. There may also be a mildly displaced fracture of the left
sixth rib.
IMPRESSION: 1. Acute displaced fracture of the lateral aspect of the left
seventh rib.
2. Suspect minimally displaced fracture of the lateral aspect of the
left 6 rib as well.
3. Associated mild subpleural thickening likely reflecting edema or
hematoma.
4. Mild pulmonary vascular congestion with cephalization. No overt
pulmonary edema.

## 2016-08-05 ENCOUNTER — Encounter: Payer: Self-pay | Admitting: Internal Medicine

## 2016-08-05 ENCOUNTER — Ambulatory Visit (INDEPENDENT_AMBULATORY_CARE_PROVIDER_SITE_OTHER): Payer: BC Managed Care – PPO | Admitting: Internal Medicine

## 2016-08-05 VITALS — BP 128/80 | HR 62 | Temp 98.2°F | Resp 18 | Ht 74.0 in | Wt 264.0 lb

## 2016-08-05 DIAGNOSIS — Z1211 Encounter for screening for malignant neoplasm of colon: Secondary | ICD-10-CM

## 2016-08-05 DIAGNOSIS — M25552 Pain in left hip: Secondary | ICD-10-CM

## 2016-08-05 DIAGNOSIS — R319 Hematuria, unspecified: Secondary | ICD-10-CM | POA: Diagnosis not present

## 2016-08-05 NOTE — Progress Notes (Signed)
Subjective:    Patient ID: Erik Quinn, male    DOB: 07-27-1966, 50 y.o.   MRN: [id number]  HPI  Patient reports that yesterday he had blood in his stool and also in his urine.  He reports that last night he also had some tea colored urine.  He reports that the next two times that he urinated after that his urine was clear.  He reports that the stool was softer.  He does not know whether he had blood on the tissue when he wiped.  He has never had blood in his stool before.  He does not strain to have bowel movements and does not typically have diarrhea.  No pain in his stomach.  He reports that he does have a hernia but he reports that this doesn't bother him.  He reports that he has no dysuria, frequency or urgency.  He has never had kidney stones in the past.  He is only taking aspirin and aleve currently.  No rectal pain.  He reports no penile discharge, no pyuria.  No testicular pain.     Patient has been having left hip pain for some time now.  He was told based on an MRI that he had severe arthritis of the left hip.  He has never seen an orthopedist.  He does take aleve for this daily.    Review of Systems  Constitutional: Negative for chills and fever.  HENT: Negative for congestion, ear pain and sore throat.   Eyes: Negative.   Respiratory: Negative for cough, shortness of breath and wheezing.   Cardiovascular: Negative for chest pain, palpitations and leg swelling.  Gastrointestinal: Positive for blood in stool and diarrhea. Negative for abdominal pain, anal bleeding, constipation, nausea and vomiting.  Genitourinary: Negative.  Negative for decreased urine volume, dysuria, frequency, hematuria, penile pain, penile swelling, scrotal swelling, testicular pain and urgency.  Skin: Negative.   Neurological: Negative for dizziness and headaches.  Psychiatric/Behavioral: The patient is not nervous/anxious.        Objective:   Physical Exam  Constitutional: He is oriented to person,  place, and time. He appears well-developed and well-nourished. No distress.  HENT:  Head: Normocephalic.  Mouth/Throat: Oropharynx is clear and moist. No oropharyngeal exudate.  Eyes: Conjunctivae are normal. No scleral icterus.  Neck: Normal range of motion. Neck supple. No JVD present. No thyromegaly present.  Cardiovascular: Normal rate, regular rhythm, normal heart sounds and intact distal pulses.  Exam reveals no gallop and no friction rub.   No murmur heard. Pulmonary/Chest: Effort normal and breath sounds normal. No respiratory distress. He has no wheezes. He has no rales. He exhibits no tenderness.  Abdominal: Soft. Bowel sounds are normal. He exhibits no distension and no mass. There is no tenderness. There is no rebound and no guarding.  Genitourinary:     Musculoskeletal: Normal range of motion.  Lymphadenopathy:    He has no cervical adenopathy.  Neurological: He is alert and oriented to person, place, and time. No cranial nerve deficit. Coordination normal.  Skin: Skin is warm and dry. No rash noted. He is not diaphoretic.  Psychiatric: He has a normal mood and affect. His behavior is normal. Judgment and thought content normal.  Nursing note and vitals reviewed.   Vitals:   08/05/16 0940  BP: 128/80  Pulse: 62  Resp: 18  Temp: 98.2 F (36.8 C)          Assessment & Plan:    1. Hematuria -likely dehydration  given patients lack of water intake with heavy physical exertion -rule out infection -no UTI/prostatitis symptoms -also rule out hematuria - Urinalysis, Routine w reflex microscopic (not at [facility]) - Culture, Urine  2. Screening for colon cancer -given skin breakdown and 1 limited experience of rectal bleeding patient was offered anusol which he refused.  He reports that he would rather get set up for a colonoscopy instead.   - patient to call if abdominal pain or increased rectal bleeding - Ambulatory referral to Gastroenterology  3. Left hip  pain  - Ambulatory referral to Orthopedic Surgery

## 2016-08-06 LAB — URINALYSIS, ROUTINE W REFLEX MICROSCOPIC
BILIRUBIN URINE: NEGATIVE
GLUCOSE, UA: NEGATIVE
Ketones, ur: NEGATIVE
Leukocytes, UA: NEGATIVE
Nitrite: NEGATIVE
PROTEIN: NEGATIVE
SPECIFIC GRAVITY, URINE: 1.024 (ref 1.001–1.035)
pH: 6 (ref 5.0–8.0)

## 2016-08-06 LAB — URINALYSIS, MICROSCOPIC ONLY
BACTERIA UA: NONE SEEN [HPF]
CASTS: NONE SEEN [LPF]
SQUAMOUS EPITHELIAL / LPF: NONE SEEN [HPF] (ref <=5)
WBC, UA: NONE SEEN WBC/HPF (ref <=5)
YEAST: NONE SEEN [HPF]

## 2016-08-06 LAB — URINE CULTURE: Organism ID, Bacteria: NO GROWTH

## 2016-11-18 ENCOUNTER — Other Ambulatory Visit: Payer: Self-pay | Admitting: Internal Medicine

## 2016-11-20 ENCOUNTER — Encounter: Payer: Self-pay | Admitting: Internal Medicine

## 2016-11-30 ENCOUNTER — Encounter: Payer: Self-pay | Admitting: Internal Medicine

## 2016-11-30 ENCOUNTER — Ambulatory Visit (INDEPENDENT_AMBULATORY_CARE_PROVIDER_SITE_OTHER): Payer: BC Managed Care – PPO | Admitting: Internal Medicine

## 2016-11-30 VITALS — BP 126/80 | HR 62 | Temp 98.4°F | Resp 18 | Ht 74.0 in | Wt 260.0 lb

## 2016-11-30 DIAGNOSIS — Z87891 Personal history of nicotine dependence: Secondary | ICD-10-CM

## 2016-11-30 DIAGNOSIS — Z01818 Encounter for other preprocedural examination: Secondary | ICD-10-CM | POA: Diagnosis not present

## 2016-11-30 NOTE — Progress Notes (Signed)
Assessment and Plan:   1. Preoperative clearance -EKG WNL -no history of cardiovascular disease but given smoking history is moderate risk for anesthesia and difficulty extubating secondary to likely early COPD.  Patient is appropriate for surgery and is aware that the risks of anesthesia and surgery include, bleeding, infection and death.   -Patient is cleared for surgery from a medical and cardiac standpoint. - DG Chest 2 View; Future  2. History of tobacco abuse -quit approximately a week ago -will make sure no lung lesions or infections. - DG Chest 2 View; Future     HPI 50 y.o.male presents for surgical clearance for a total hip replacement to be performed by Dr. Mayer Camel tentatively on January 8th at the [facility] surgical center as an outpatient.  Patient has no complaints today.  He is currently only taking aleve and ASA.  He did have labs drawn at [place] which were sent to the orthopedic office.  He has no personal history of cardiovascular disease or problems with anesthesia.  He does have a roughly 60-70 pack year history of smoking.  He reports that he has not had a cigarrette since last week.  He is doing well off the cigarettes.  He reports no shortness of breath.     Past Medical History:  Diagnosis Date  . Hypertension   . Prediabetes      No Known Allergies    Current Outpatient Prescriptions on File Prior to Visit  Medication Sig Dispense Refill  . aspirin 81 MG tablet Take 81 mg by mouth daily.    . naproxen sodium (ANAPROX) 220 MG tablet Take 220 mg by mouth 2 (two) times daily with a meal.     No current facility-administered medications on file prior to visit.     ROS: all negative except above.   Physical Exam: Filed Weights   11/30/16 1140  Weight: 260 lb (117.9 kg)   BP 126/80   Pulse 62   Temp 98.4 F (36.9 C) (Temporal)   Resp 18   Ht 6\' 2"  (1.88 m)   Wt 260 lb (117.9 kg)   BMI 33.38 kg/m  General Appearance: Well developed well nourished,  non-toxic appearing in no apparent distress. Eyes: PERRLA, EOMs, conjunctiva w/ no swelling or erythema or discharge Sinuses: No Frontal/maxillary tenderness ENT/Mouth: Ear canals clear without swelling or erythema.  TM's normal bilaterally with no retractions, bulging, or loss of landmarks.   Neck: Supple, thyroid normal, no notable JVD  Respiratory: Respiratory effort normal, Clear breath sounds anteriorly and posteriorly bilaterally without rales, rhonchi, wheezing or stridor. No retractions or accessory muscle usage. Cardio: RRR with no MRGs.   Abdomen: Soft, + BS.  Non tender, no guarding, rebound, hernias, masses.  Musculoskeletal: Full ROM, 5/5 strength, normal gait.  Skin: Warm, dry without rashes  Neuro: Awake and oriented X 3, Cranial nerves intact. Normal muscle tone, no cerebellar symptoms. Sensation intact.  Psych: normal affect, Insight and Judgment appropriate.     Starlyn Skeans, PA-C 12:22 PM [facility]

## 2016-12-02 ENCOUNTER — Ambulatory Visit (HOSPITAL_COMMUNITY)
Admission: RE | Admit: 2016-12-02 | Discharge: 2016-12-02 | Disposition: A | Discharge status: Home / Self Care | Payer: BC Managed Care – PPO | Source: Ambulatory Visit | Attending: Internal Medicine | Admitting: Internal Medicine

## 2016-12-02 DIAGNOSIS — Z87891 Personal history of nicotine dependence: Secondary | ICD-10-CM

## 2016-12-02 DIAGNOSIS — Z01818 Encounter for other preprocedural examination: Secondary | ICD-10-CM | POA: Insufficient documentation

## 2016-12-06 NOTE — Addendum Note (Signed)
Addended by: Sheril Hammond A on: 12/06/2016 04:42 PM   Modules accepted: Orders

## 2016-12-26 ENCOUNTER — Encounter: Payer: Self-pay | Admitting: *Deleted

## 2016-12-28 ENCOUNTER — Other Ambulatory Visit: Payer: Self-pay | Admitting: Internal Medicine

## 2016-12-28 DIAGNOSIS — J4520 Mild intermittent asthma, uncomplicated: Secondary | ICD-10-CM

## 2016-12-28 MED ORDER — ALBUTEROL SULFATE HFA 108 (90 BASE) MCG/ACT IN AERS: INHALATION_SPRAY | RESPIRATORY_TRACT | 1 refills | Status: DC

## 2017-08-26 ENCOUNTER — Encounter (HOSPITAL_BASED_OUTPATIENT_CLINIC_OR_DEPARTMENT_OTHER): Payer: Self-pay | Admitting: *Deleted

## 2017-08-31 NOTE — H&P (Signed)
Erik Quinn is an 51 y.o. male.   Chief Complaint: Left Knee Pain  HPI: Erik Quinn is here today for follow-up on left knee pain.  He was last seen on 05/28/17 at which time we decided to order an MRI to rule out meniscal tear.  Recall, he did have a cortisone injection on 05/08/17 that did provide some initial relief but no sustained relief.  He continues noticed left knee pain and states that it does affect his job.  He denies any new injuries.  He denies any fevers chills night sweats or other signs of infection.  He does have a prior history of a left total hip arthroplasty performed on 01/01/17 that continues to perform well.  Past Medical History:  Diagnosis Date  . GERD (gastroesophageal reflux disease)   . Hypertension   . Prediabetes     Past Surgical History:  Procedure Laterality Date  . SPLENECTOMY  1994  . TOTAL HIP ARTHROPLASTY      Family History  Problem Relation Age of Onset  . Diabetes Mother   . Sleep apnea Mother    Social History:  reports that he quit smoking about 9 months ago. His smoking use included Cigarettes. He smoked 0.33 packs per day. He has never used smokeless tobacco. He reports that he drinks about 4.8 oz of alcohol per week . He reports that he does not use drugs.  Allergies: No Known Allergies  No prescriptions prior to admission.    No results found for this or any previous visit (from the past 48 hour(s)). No results found.  Review of Systems  Constitutional: Negative.   HENT: Negative.   Eyes: Negative.   Respiratory: Negative.   Cardiovascular: Negative.   Gastrointestinal: Negative.   Genitourinary: Negative.   Musculoskeletal: Positive for joint pain and myalgias.  Skin: Negative.   Neurological: Negative.   Endo/Heme/Allergies: Negative.   Psychiatric/Behavioral: Negative.     Height 6\' 2"  (1.88 m), weight 117.9 kg (260 lb). Physical Exam  Constitutional: He is oriented to person, place, and time. He appears well-developed and  well-nourished.  HENT:  Head: Normocephalic and atraumatic.  Eyes: Pupils are equal, round, and reactive to light.  Neck: Normal range of motion.  Cardiovascular: Intact distal pulses.   Respiratory: Effort normal.  Musculoskeletal: He exhibits tenderness.  Patient continues to have pain with palpation over the lateral aspect of the left knee.  No instability.  He has a range from 0-130.  Calves are soft and nontender.  He is neurovascularly intact distally.  Neurological: He is alert and oriented to person, place, and time.  Skin: Skin is warm and dry.  Psychiatric: He has a normal mood and affect. His behavior is normal. Judgment and thought content normal.     Assessment/Plan  Assess: Left knee lateral meniscal tear and moderate arthritis lateral compartment  Plan: Treatment options are discussed with the patient.  Again he did get considerable relief from the anesthetic from the cortisone injection.  He is not yet ready to discuss knee replacement and I am and agreement with this.  Today, he wishes to proceed with a knee arthroscopy.  A posting slip is completed.  The benefits risks and potential complications of surgery are discussed.  He wishes to proceed.  He is given Juliann Pulse Blume's card and he will call in the near future to schedule surgery.  He is to follow-up as needed.  Call with any issues.  No medications asked for or given today.  Gustaf Mccarter,  Natelie Ostrosky R, PA-C 08/31/2017, 8:24 AM

## 2017-09-01 ENCOUNTER — Encounter (HOSPITAL_BASED_OUTPATIENT_CLINIC_OR_DEPARTMENT_OTHER): Payer: Self-pay | Admitting: *Deleted

## 2017-09-01 ENCOUNTER — Ambulatory Visit (HOSPITAL_BASED_OUTPATIENT_CLINIC_OR_DEPARTMENT_OTHER): Payer: BC Managed Care – PPO | Admitting: Anesthesiology

## 2017-09-01 ENCOUNTER — Encounter (HOSPITAL_BASED_OUTPATIENT_CLINIC_OR_DEPARTMENT_OTHER)
Admission: RE | Disposition: A | Discharge status: Home / Self Care | Payer: Self-pay | Source: Ambulatory Visit | Attending: Orthopedic Surgery

## 2017-09-01 ENCOUNTER — Ambulatory Visit (HOSPITAL_BASED_OUTPATIENT_CLINIC_OR_DEPARTMENT_OTHER)
Admission: RE | Admit: 2017-09-01 | Discharge: 2017-09-01 | Disposition: A | Discharge status: Home / Self Care | Payer: BC Managed Care – PPO | Source: Ambulatory Visit | Attending: Orthopedic Surgery | Admitting: Orthopedic Surgery

## 2017-09-01 DIAGNOSIS — R7303 Prediabetes: Secondary | ICD-10-CM | POA: Diagnosis not present

## 2017-09-01 DIAGNOSIS — Z87891 Personal history of nicotine dependence: Secondary | ICD-10-CM | POA: Diagnosis not present

## 2017-09-01 DIAGNOSIS — Z79899 Other long term (current) drug therapy: Secondary | ICD-10-CM | POA: Insufficient documentation

## 2017-09-01 DIAGNOSIS — I10 Essential (primary) hypertension: Secondary | ICD-10-CM | POA: Insufficient documentation

## 2017-09-01 DIAGNOSIS — Z6831 Body mass index (BMI) 31.0-31.9, adult: Secondary | ICD-10-CM | POA: Diagnosis not present

## 2017-09-01 DIAGNOSIS — X58XXXA Exposure to other specified factors, initial encounter: Secondary | ICD-10-CM | POA: Diagnosis not present

## 2017-09-01 DIAGNOSIS — M94262 Chondromalacia, left knee: Secondary | ICD-10-CM | POA: Diagnosis not present

## 2017-09-01 DIAGNOSIS — Z7982 Long term (current) use of aspirin: Secondary | ICD-10-CM | POA: Insufficient documentation

## 2017-09-01 DIAGNOSIS — K219 Gastro-esophageal reflux disease without esophagitis: Secondary | ICD-10-CM | POA: Diagnosis not present

## 2017-09-01 DIAGNOSIS — E669 Obesity, unspecified: Secondary | ICD-10-CM | POA: Insufficient documentation

## 2017-09-01 DIAGNOSIS — S83282A Other tear of lateral meniscus, current injury, left knee, initial encounter: Secondary | ICD-10-CM | POA: Insufficient documentation

## 2017-09-01 HISTORY — DX: Gastro-esophageal reflux disease without esophagitis: K21.9

## 2017-09-01 HISTORY — PX: KNEE ARTHROSCOPY WITH LATERAL MENISECTOMY: SHX6193

## 2017-09-01 SURGERY — ARTHROSCOPY, KNEE, WITH LATERAL MENISCECTOMY
Anesthesia: General | Site: Knee | Laterality: Left

## 2017-09-01 MED ORDER — OXYCODONE HCL 5 MG PO TABS: 5.0000 mg | ORAL_TABLET | Freq: Once | ORAL | Status: DC | PRN

## 2017-09-01 MED ORDER — MIDAZOLAM HCL 2 MG/2ML IJ SOLN
INTRAMUSCULAR | Status: AC
  Filled 2017-09-01: qty 2

## 2017-09-01 MED ORDER — PROMETHAZINE HCL 25 MG/ML IJ SOLN: 6.2500 mg | INTRAMUSCULAR | Status: DC | PRN

## 2017-09-01 MED ORDER — HYDROMORPHONE HCL 1 MG/ML IJ SOLN: 0.2500 mg | INTRAMUSCULAR | Status: DC | PRN

## 2017-09-01 MED ORDER — SCOPOLAMINE 1 MG/3DAYS TD PT72: 1.0000 | MEDICATED_PATCH | Freq: Once | TRANSDERMAL | Status: DC | PRN

## 2017-09-01 MED ORDER — CHLORHEXIDINE GLUCONATE 4 % EX LIQD: 60.0000 mL | Freq: Once | CUTANEOUS | Status: DC

## 2017-09-01 MED ORDER — LIDOCAINE 2% (20 MG/ML) 5 ML SYRINGE
INTRAMUSCULAR | Status: DC | PRN
  Administered 2017-09-01: 100 mg via INTRAVENOUS

## 2017-09-01 MED ORDER — SODIUM CHLORIDE 0.9 % IR SOLN
Status: DC | PRN
  Administered 2017-09-01: 2 mL

## 2017-09-01 MED ORDER — FENTANYL CITRATE (PF) 100 MCG/2ML IJ SOLN
INTRAMUSCULAR | Status: AC
  Filled 2017-09-01: qty 2

## 2017-09-01 MED ORDER — CEFAZOLIN SODIUM-DEXTROSE 2-4 GM/100ML-% IV SOLN
INTRAVENOUS | Status: AC
  Filled 2017-09-01: qty 100

## 2017-09-01 MED ORDER — CEFAZOLIN SODIUM-DEXTROSE 2-4 GM/100ML-% IV SOLN
2.0000 g | INTRAVENOUS | Status: AC
  Administered 2017-09-01: 2 g via INTRAVENOUS

## 2017-09-01 MED ORDER — FENTANYL CITRATE (PF) 100 MCG/2ML IJ SOLN
INTRAMUSCULAR | Status: AC
Start: 2017-09-01 — End: 2017-09-01
  Filled 2017-09-01: qty 2

## 2017-09-01 MED ORDER — BUPIVACAINE-EPINEPHRINE 0.5% -1:200000 IJ SOLN
INTRAMUSCULAR | Status: DC | PRN
  Administered 2017-09-01: 30 mL

## 2017-09-01 MED ORDER — PROPOFOL 10 MG/ML IV BOLUS
INTRAVENOUS | Status: DC | PRN
  Administered 2017-09-01: 200 mg via INTRAVENOUS

## 2017-09-01 MED ORDER — DEXAMETHASONE SODIUM PHOSPHATE 4 MG/ML IJ SOLN
INTRAMUSCULAR | Status: DC | PRN
  Administered 2017-09-01: 10 mg via INTRAVENOUS

## 2017-09-01 MED ORDER — HYDROCODONE-ACETAMINOPHEN 5-325 MG PO TABS: 1.0000 | ORAL_TABLET | ORAL | 0 refills | Status: DC | PRN

## 2017-09-01 MED ORDER — PROPOFOL 10 MG/ML IV BOLUS
INTRAVENOUS | Status: AC
  Filled 2017-09-01: qty 20

## 2017-09-01 MED ORDER — DEXTROSE-NACL 5-0.45 % IV SOLN: INTRAVENOUS | Status: DC

## 2017-09-01 MED ORDER — ONDANSETRON HCL 4 MG/2ML IJ SOLN
INTRAMUSCULAR | Status: DC | PRN
  Administered 2017-09-01: 4 mg via INTRAVENOUS

## 2017-09-01 MED ORDER — FENTANYL CITRATE (PF) 100 MCG/2ML IJ SOLN
50.0000 ug | INTRAMUSCULAR | Status: DC | PRN
  Administered 2017-09-01: 25 ug via INTRAVENOUS
  Administered 2017-09-01: 100 ug via INTRAVENOUS

## 2017-09-01 MED ORDER — ONDANSETRON HCL 4 MG/2ML IJ SOLN
INTRAMUSCULAR | Status: AC
  Filled 2017-09-01: qty 2

## 2017-09-01 MED ORDER — OXYCODONE HCL 5 MG/5ML PO SOLN: 5.0000 mg | Freq: Once | ORAL | Status: DC | PRN

## 2017-09-01 MED ORDER — GLYCOPYRROLATE 0.2 MG/ML IJ SOLN
INTRAMUSCULAR | Status: DC | PRN
  Administered 2017-09-01: 0.2 mg via INTRAVENOUS

## 2017-09-01 MED ORDER — DEXAMETHASONE SODIUM PHOSPHATE 10 MG/ML IJ SOLN
INTRAMUSCULAR | Status: AC
  Filled 2017-09-01: qty 1

## 2017-09-01 MED ORDER — MIDAZOLAM HCL 2 MG/2ML IJ SOLN
1.0000 mg | INTRAMUSCULAR | Status: DC | PRN
  Administered 2017-09-01: 2 mg via INTRAVENOUS

## 2017-09-01 MED ORDER — LACTATED RINGERS IV SOLN
INTRAVENOUS | Status: DC
  Administered 2017-09-01: 12:00:00 via INTRAVENOUS

## 2017-09-01 MED ORDER — LIDOCAINE 2% (20 MG/ML) 5 ML SYRINGE
INTRAMUSCULAR | Status: AC
  Filled 2017-09-01: qty 5

## 2017-09-01 SURGICAL SUPPLY — 43 items
BANDAGE ACE 6X5 VEL STRL LF (GAUZE/BANDAGES/DRESSINGS) ×3 IMPLANT
BLADE 4.2CUDA (BLADE) IMPLANT
BLADE CUTTER GATOR 3.5 (BLADE) IMPLANT
BLADE GREAT WHITE 4.2 (BLADE) IMPLANT
BLADE GREAT WHITE 4.2MM (BLADE)
BNDG COHESIVE 6X5 TAN STRL LF (GAUZE/BANDAGES/DRESSINGS) ×3 IMPLANT
DRAPE ARTHROSCOPY W/POUCH 114 (DRAPES) ×3 IMPLANT
DURAPREP 26ML APPLICATOR (WOUND CARE) ×3 IMPLANT
ELECT MENISCUS 165MM 90D (ELECTRODE) IMPLANT
ELECT REM PT RETURN 9FT ADLT (ELECTROSURGICAL)
ELECTRODE REM PT RTRN 9FT ADLT (ELECTROSURGICAL) IMPLANT
GAUZE SPONGE 4X4 12PLY STRL (GAUZE/BANDAGES/DRESSINGS) ×3 IMPLANT
GAUZE XEROFORM 1X8 LF (GAUZE/BANDAGES/DRESSINGS) ×3 IMPLANT
GLOVE BIO SURGEON STRL SZ 6.5 (GLOVE) ×2 IMPLANT
GLOVE BIO SURGEON STRL SZ7.5 (GLOVE) ×3 IMPLANT
GLOVE BIO SURGEON STRL SZ8.5 (GLOVE) ×3 IMPLANT
GLOVE BIO SURGEONS STRL SZ 6.5 (GLOVE) ×1
GLOVE BIOGEL PI IND STRL 7.0 (GLOVE) IMPLANT
GLOVE BIOGEL PI IND STRL 8 (GLOVE) ×1 IMPLANT
GLOVE BIOGEL PI IND STRL 9 (GLOVE) ×1 IMPLANT
GLOVE BIOGEL PI INDICATOR 7.0 (GLOVE) ×2
GLOVE BIOGEL PI INDICATOR 8 (GLOVE) ×4
GLOVE BIOGEL PI INDICATOR 9 (GLOVE) ×2
GOWN STRL REUS W/ TWL LRG LVL3 (GOWN DISPOSABLE) ×4 IMPLANT
GOWN STRL REUS W/TWL LRG LVL3 (GOWN DISPOSABLE) ×12
GOWN STRL REUS W/TWL XL LVL3 (GOWN DISPOSABLE) ×3 IMPLANT
IV NS IRRIG 3000ML ARTHROMATIC (IV SOLUTION) ×5 IMPLANT
KNEE WRAP E Z 3 GEL PACK (MISCELLANEOUS) ×3 IMPLANT
MANIFOLD NEPTUNE II (INSTRUMENTS) IMPLANT
NDL SAFETY ECLIPSE 18X1.5 (NEEDLE) ×1 IMPLANT
NEEDLE HYPO 18GX1.5 SHARP (NEEDLE) ×3
PACK ARTHROSCOPY DSU (CUSTOM PROCEDURE TRAY) ×3 IMPLANT
PACK BASIN DAY SURGERY FS (CUSTOM PROCEDURE TRAY) ×3 IMPLANT
PAD ALCOHOL SWAB (MISCELLANEOUS) ×12 IMPLANT
PENCIL BUTTON HOLSTER BLD 10FT (ELECTRODE) IMPLANT
PROBE BIPOLAR ATHRO 135MM 90D (MISCELLANEOUS) IMPLANT
SET ARTHROSCOPY TUBING (MISCELLANEOUS) ×3
SET ARTHROSCOPY TUBING LN (MISCELLANEOUS) ×1 IMPLANT
SLEEVE SCD COMPRESS KNEE MED (MISCELLANEOUS) ×2 IMPLANT
SYR 3ML 18GX1 1/2 (SYRINGE) IMPLANT
SYR 5ML LL (SYRINGE) ×3 IMPLANT
TOWEL OR 17X24 6PK STRL BLUE (TOWEL DISPOSABLE) ×3 IMPLANT
WATER STERILE IRR 1000ML POUR (IV SOLUTION) ×3 IMPLANT

## 2017-09-01 NOTE — Interval H&P Note (Signed)
History and Physical Interval Note:  09/01/2017 11:46 AM  Erik Quinn  has presented today for surgery, with the diagnosis of LEFT KNEE LATERAL MENISCUS TEAR  The various methods of treatment have been discussed with the patient and family. After consideration of risks, benefits and other options for treatment, the patient has consented to  Procedure(s): ARTHROSCOPY LEFT KNEE (Left) as a surgical intervention .  The patient's history has been reviewed, patient examined, no change in status, stable for surgery.  I have reviewed the patient's chart and labs.  Questions were answered to the patient's satisfaction.     [name]

## 2017-09-01 NOTE — Discharge Instructions (Addendum)

## 2017-09-01 NOTE — Op Note (Signed)
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 Mayer Camel M.D.  Assist: Kerry Hough. Barton Dubois  (present throughout entire procedure and necessary for timely completion of the procedure) Anes: General LMA  EBL: Minimal  Fluids: 800 cc   Indications: Catching popping and pain in the left knee. MRI scan clearly shows lateral meniscal tear.. Pt has failed conservative treatment with anti-inflammatory medicines, physical therapy, and modified activites but did get good temporarily from an intra-articular cortisone injection. Pain has recurred and patient desires elective arthroscopic evaluation and treatment of knee. Risks and benefits of surgery have been discussed and questions answered.  Procedure: Patient identified by arm band and taken to the operating room at the day surgery Center. The appropriate anesthetic monitors were attached, and General LMA anesthesia was induced without difficulty. Lateral post was applied to the table and the lower extremity was prepped and draped in usual sterile fashion from the ankle to the midthigh. Time out procedure was performed. We began the operation by making standard inferior lateral and inferior medial peripatellar portals with a #11 blade allowing introduction of the arthroscope through the inferior lateral portal and the out flow to the inferior medial portal. 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 The anterior cruciate ligament and PCL are intact. On the lateral side complex  multiplane tearing of the lateral meniscus starting out mid lateral going through the popliteal recess and then posterior to the posterior horn. This is debrided with a straight biter 4.2 gray-white sucker shaver 35 Gator sucker shaver. Articular cartilage had grade 2-3 chondromalacia laterally also lightly debrided. There was no bare bone.. The knee was irrigated out normal saline solution. A dressing of xerofoam 4 x 4 dressing sponges, web roll and an Ace wrap was applied. The patient was awakened extubated and taken to the recovery without difficulty.    Signed: [name], MD

## 2017-09-01 NOTE — Transfer of Care (Signed)
Immediate Anesthesia Transfer of Care Note  Patient: Erik Quinn  Procedure(s) Performed: Procedure(s): ARTHROSCOPY LEFT KNEE, DEBRIDEMENT OF CHONDROMALACIA (Left)  Patient Location: PACU  Anesthesia Type:General  Level of Consciousness: awake, oriented, sedated and patient cooperative  Airway & Oxygen Therapy: Patient Spontanous Breathing  Post-op Assessment: Report given to RN and Post -op Vital signs reviewed and stable  Post vital signs: Reviewed and stable  Last Vitals:  Vitals:   09/01/17 0957  BP: (!) 147/90  Pulse: (!) 46  Resp: 17  Temp: 36.8 C  SpO2: 98%    Last Pain:  Vitals:   09/01/17 0957  TempSrc: Oral         Complications: No apparent anesthesia complications

## 2017-09-01 NOTE — Anesthesia Preprocedure Evaluation (Addendum)
Anesthesia Evaluation  Patient identified by MRN, date of birth, ID band Patient awake    Reviewed: Allergy & Precautions, NPO status , Patient's Chart, lab work & pertinent test results  Airway Mallampati: III  TM Distance: >3 FB Neck ROM: Full    Dental  (+) Poor Dentition, Missing   Pulmonary Current Smoker,    breath sounds clear to auscultation       Cardiovascular (-) hypertension Rhythm:Regular Rate:Normal  ECG: SB, rate 55   Neuro/Psych negative neurological ROS  negative psych ROS   GI/Hepatic Neg liver ROS, GERD (diet related)  Controlled,  Endo/Other  negative endocrine ROS  Renal/GU negative Renal ROS     Musculoskeletal negative musculoskeletal ROS (+)   Abdominal (+) + obese,   Peds  Hematology negative hematology ROS (+)   Anesthesia Other Findings   Reproductive/Obstetrics                            Anesthesia Physical Anesthesia Plan  ASA: III  Anesthesia Plan: General   Post-op Pain Management:    Induction: Intravenous  PONV Risk Score and Plan: 1 and Ondansetron and Dexamethasone  Airway Management Planned: LMA  Additional Equipment:   Intra-op Plan:   Post-operative Plan: Extubation in OR  Informed Consent: I have reviewed the patients History and Physical, chart, labs and discussed the procedure including the risks, benefits and alternatives for the proposed anesthesia with the patient or authorized representative who has indicated his/her understanding and acceptance.   Dental advisory given  Plan Discussed with: CRNA  Anesthesia Plan Comments:         Anesthesia Quick Evaluation

## 2017-09-01 NOTE — Anesthesia Postprocedure Evaluation (Signed)
Anesthesia Post Note  Patient: Geran Maxton  Procedure(s) Performed: Procedure(s) (LRB): ARTHROSCOPY LEFT KNEE, DEBRIDEMENT OF CHONDROMALACIA (Left)     Patient location during evaluation: PACU Anesthesia Type: General Level of consciousness: awake and alert Pain management: pain level controlled Vital Signs Assessment: post-procedure vital signs reviewed and stable Respiratory status: spontaneous breathing, nonlabored ventilation, respiratory function stable and patient connected to nasal cannula oxygen Cardiovascular status: blood pressure returned to baseline and stable Postop Assessment: no signs of nausea or vomiting Anesthetic complications: no    Last Vitals:  Vitals:   09/01/17 1345 09/01/17 1403  BP: 131/88 (!) 148/92  Pulse: (!) 55 (!) 43  Resp: 18 20  Temp:  36.6 C  SpO2: 97% 98%    Last Pain:  Vitals:   09/01/17 1403  TempSrc: Oral                 Jarion Hawthorne P Lakelyn Straus

## 2017-09-02 ENCOUNTER — Encounter (HOSPITAL_BASED_OUTPATIENT_CLINIC_OR_DEPARTMENT_OTHER): Payer: Self-pay | Admitting: Orthopedic Surgery

## 2017-09-14 ENCOUNTER — Ambulatory Visit (INDEPENDENT_AMBULATORY_CARE_PROVIDER_SITE_OTHER): Payer: BC Managed Care – PPO | Admitting: Internal Medicine

## 2017-09-14 VITALS — BP 122/84 | HR 72 | Temp 97.3°F | Resp 18 | Ht 74.0 in | Wt 258.4 lb

## 2017-09-14 DIAGNOSIS — B354 Tinea corporis: Secondary | ICD-10-CM | POA: Diagnosis not present

## 2017-09-14 MED ORDER — PREDNISONE 20 MG PO TABS: ORAL_TABLET | ORAL | 0 refills | Status: DC

## 2017-09-14 MED ORDER — TERBINAFINE HCL 250 MG PO TABS: ORAL_TABLET | ORAL | 0 refills | Status: DC

## 2017-09-14 NOTE — Progress Notes (Signed)
  Subjective:    Patient ID: Erik Quinn, male    DOB: 1966/02/26, 51 y.o.   MRN: [id number]  HPI  This nice 51 yo MWM presents with a pruritic rash of 1-2 months of his UE's and upper Lt chest. He does have hx/o scalp psoriasis. He relate that he has tried various steroid type creams. Patient had recent Lt knee scope and is receiving PT and is limping due to Lt knee discomfort.   Review of Systems  10 point systems review negative except as above.    Objective:   Physical Exam  BP 122/84   Pulse 72   Temp (!) 97.3 F (36.3 C)   Resp 18   Ht 6\' 2"  (1.88 m)   Wt 258 lb 6.4 oz (117.2 kg)   BMI 33.18 kg/m   HEENT - WNL. Neck - supple.  Chest - Clear. Cor - Nl HS. RRR w/o sig M. MS- FROM w/o deformities.  Limping Gait Nl s/p recent Lt knee scope.  Skin  Ruddy facies. Noted scaly to smooth seripiginous to confluent pinkish rash over dorsal forearms and  upper L ant chest.     Assessment & Plan:   1. Tinea circinata  - terbinafine  250 MG; Take 1 tablet daily for skin fungus  Disp: 21 tablet  - predniSONE 20 MG; 1 tab 3 x day for 2 days, then 1 tab 2 x day for 2 days, then 1 tab 1 x day for 3 days  Disp: 13 tablet

## 2018-09-20 ENCOUNTER — Encounter: Payer: Self-pay | Admitting: Internal Medicine

## 2018-09-20 ENCOUNTER — Telehealth: Payer: Self-pay | Admitting: Internal Medicine

## 2018-09-20 NOTE — Telephone Encounter (Signed)
Returned patient's call requesting referral back to Dr Frederik Pear to evaluate for Right Hip Surgery. Per patient had lft hip surgery  53yr ago, provider recommendation was for patient to return to evaluate right hip. Scheduled patient w/ Dr Mayer Camel for 09-28-18, and scheduled CPE w/ patient  for 10/26/2018 [facility]. Left vm for patient w/ referral appointment information and mailed letter. No referral was needed per ortho, was seen 71yr ago.

## 2018-10-26 ENCOUNTER — Encounter: Payer: Self-pay | Admitting: Internal Medicine

## 2018-12-06 ENCOUNTER — Encounter: Payer: Self-pay | Admitting: Internal Medicine

## 2018-12-06 ENCOUNTER — Ambulatory Visit: Payer: BC Managed Care – PPO | Admitting: Internal Medicine

## 2018-12-06 VITALS — BP 132/80 | HR 60 | Temp 97.6°F | Resp 16 | Ht 74.0 in | Wt 246.2 lb

## 2018-12-06 DIAGNOSIS — E559 Vitamin D deficiency, unspecified: Secondary | ICD-10-CM

## 2018-12-06 DIAGNOSIS — F17200 Nicotine dependence, unspecified, uncomplicated: Secondary | ICD-10-CM

## 2018-12-06 DIAGNOSIS — Z130 Encounter for screening for diseases of the blood and blood-forming organs and certain disorders involving the immune mechanism: Secondary | ICD-10-CM | POA: Diagnosis not present

## 2018-12-06 DIAGNOSIS — Z0001 Encounter for general adult medical examination with abnormal findings: Secondary | ICD-10-CM

## 2018-12-06 DIAGNOSIS — R350 Frequency of micturition: Secondary | ICD-10-CM | POA: Diagnosis not present

## 2018-12-06 DIAGNOSIS — I10 Essential (primary) hypertension: Secondary | ICD-10-CM | POA: Diagnosis not present

## 2018-12-06 DIAGNOSIS — Z1211 Encounter for screening for malignant neoplasm of colon: Secondary | ICD-10-CM

## 2018-12-06 DIAGNOSIS — Z79899 Other long term (current) drug therapy: Secondary | ICD-10-CM | POA: Diagnosis not present

## 2018-12-06 DIAGNOSIS — N401 Enlarged prostate with lower urinary tract symptoms: Secondary | ICD-10-CM

## 2018-12-06 DIAGNOSIS — Z111 Encounter for screening for respiratory tuberculosis: Secondary | ICD-10-CM

## 2018-12-06 DIAGNOSIS — Z136 Encounter for screening for cardiovascular disorders: Secondary | ICD-10-CM

## 2018-12-06 DIAGNOSIS — Z1389 Encounter for screening for other disorder: Secondary | ICD-10-CM | POA: Diagnosis not present

## 2018-12-06 DIAGNOSIS — R5383 Other fatigue: Secondary | ICD-10-CM

## 2018-12-06 DIAGNOSIS — Z13220 Encounter for screening for lipoid disorders: Secondary | ICD-10-CM

## 2018-12-06 DIAGNOSIS — Z125 Encounter for screening for malignant neoplasm of prostate: Secondary | ICD-10-CM

## 2018-12-06 DIAGNOSIS — E782 Mixed hyperlipidemia: Secondary | ICD-10-CM

## 2018-12-06 DIAGNOSIS — Z1212 Encounter for screening for malignant neoplasm of rectum: Secondary | ICD-10-CM

## 2018-12-06 DIAGNOSIS — R0989 Other specified symptoms and signs involving the circulatory and respiratory systems: Secondary | ICD-10-CM

## 2018-12-06 DIAGNOSIS — Z131 Encounter for screening for diabetes mellitus: Secondary | ICD-10-CM | POA: Diagnosis not present

## 2018-12-06 DIAGNOSIS — R7303 Prediabetes: Secondary | ICD-10-CM

## 2018-12-06 DIAGNOSIS — Z1329 Encounter for screening for other suspected endocrine disorder: Secondary | ICD-10-CM | POA: Diagnosis not present

## 2018-12-06 DIAGNOSIS — Z0000 Encounter for general adult medical examination without abnormal findings: Secondary | ICD-10-CM | POA: Diagnosis not present

## 2018-12-06 DIAGNOSIS — E349 Endocrine disorder, unspecified: Secondary | ICD-10-CM

## 2018-12-06 NOTE — Patient Instructions (Signed)

## 2018-12-06 NOTE — Progress Notes (Signed)
[facility]   Unk Pinto, M.D.     Uvaldo Bristle. Silverio Lay, P.A.-C Liane Comber, [place]                [address] [place], [place] [address] Telephone [phone] Telefax [phone] Annual  Screening/Preventative Visit  & Comprehensive Evaluation & Examination     This very nice 52 y.o. MWM presents for a Screening /Preventative Visit & comprehensive evaluation and management of multiple medical co-morbidities.  Patient has been followed for labile HTN, HLD, Prediabetes and Vitamin D Deficiency.     Patient is scheduled for elective Rt THR on January 9  by Dr Mayer Camel. He endorses Rt hip pains with prolonged standing or walking.  He also reports Rt shoulder pains. He had Lt THR in January 2018 by Dr Mayer Camel and has done well.      Patient is followed expectantly for labile HTN predates circa 2013. Patient's BP has been controlled and today's BP is at goal - 132/80. Patient denies any cardiac symptoms as chest pain, palpitations, shortness of breath, dizziness or ankle swelling.     Patient's hyperlipidemia is controlled with diet and medications. Patient denies myalgias or other medication SE's. Last lipids were at goal: Lab Results  Component Value Date   CHOL 107 (L) 10/22/2015   HDL 55 10/22/2015   LDLCALC 41 10/22/2015   TRIG 56 10/22/2015   CHOLHDL 1.9 10/22/2015      Patient has hx/o Morbid Obesity (BMI 31+) and prediabetes (A1c 5.5% / 2011 & 5.8% / Insulin 46 / 2014) and patient denies reactive hypoglycemic symptoms, visual blurring, diabetic polys or paresthesias. Last A1c was Normal & at goal: Lab Results  Component Value Date   HGBA1C 5.6 10/22/2015       Patient has hx/o Low Testosterone  ("138" / 2014) and declined treatment for perceived lack of need.      Finally, patient has history of Vitamin D Deficiency ("34" / 2015) and last vitamin D was still low. Lab Results   Component Value Date   VD25OH 31 10/22/2015   Current Outpatient Medications on File Prior to Visit  Medication Sig  . aspirin 81 MG tablet Take 81 mg by mouth daily.   No current facility-administered medications on file prior to visit.    No Known Allergies Past Medical History:  Diagnosis Date  . GERD (gastroesophageal reflux disease)   . Hypertension   . Prediabetes    Health Maintenance  Topic Date Due  . HIV Screening  08/03/1980  . INFLUENZA VACCINE  07/21/2018  . TETANUS/TDAP  10/21/2023  . COLONOSCOPY  08/25/2026   Immunization History  Administered Date(s) Administered  . Influenza Split 10/24/2014  . Influenza-Unspecified 09/20/2018  . PPD Test 10/24/2014  . Pneumococcal Conjugate-13 10/24/2014  . Pneumococcal-Unspecified 10/20/2013  . Tdap 10/20/2013   Last Colon - 08/25/2016 - Dr Earlean Shawl recc 10 yr f/u due Sept 2027.  Past Surgical History:  Procedure Laterality Date  . KNEE ARTHROSCOPY WITH LATERAL MENISECTOMY Left 09/01/2017   Procedure: ARTHROSCOPY LEFT KNEE, DEBRIDEMENT OF CHONDROMALACIA;  Surgeon: Frederik Pear, MD;  Location: [facility];  Service: Orthopedics;  Laterality: Left;  . SPLENECTOMY  1994  . TOTAL HIP ARTHROPLASTY     Family History  Problem Relation Age of Onset  . Diabetes Mother   . Sleep apnea Mother  Social History   Socioeconomic History  . Marital status: Married    Spouse name: NLisa  . Number of children: 2 sons / 1 GD  Occupational History  . Ret from city - now works for [employer]  Tobacco Use  . Smoking status: Current Every Day Smoker    Packs/day: 0.33    Types: Cigarettes    Last attempt to quit: 11/16/2016    Years since quitting: 2.0  . Smokeless tobacco: Never Used  Substance and Sexual Activity  . Alcohol use: Yes    Alcohol/week: 8.0 standard drinks    Types: 8 Standard drinks or equivalent per week  . Drug use: No  . Sexual activity: Not on file    ROS Constitutional:  Denies fever, chills, weight loss/gain, headaches, insomnia,  night sweats or change in appetite. Does c/o fatigue. Eyes: Denies redness, blurred vision, diplopia, discharge, itchy or watery eyes.  ENT: Denies discharge, congestion, post nasal drip, epistaxis, sore throat, earache, hearing loss, dental pain, Tinnitus, Vertigo, Sinus pain or snoring.  Cardio: Denies chest pain, palpitations, irregular heartbeat, syncope, dyspnea, diaphoresis, orthopnea, PND, claudication or edema Respiratory: denies cough, dyspnea, DOE, pleurisy, hoarseness, laryngitis or wheezing.  Gastrointestinal: Denies dysphagia, heartburn, reflux, water brash, pain, cramps, nausea, vomiting, bloating, diarrhea, constipation, hematemesis, melena, hematochezia, jaundice or hemorrhoids Genitourinary: Denies dysuria, frequency, urgency, nocturia, hesitancy, discharge, hematuria or flank pain Musculoskeletal: Denies arthralgia, myalgia, stiffness, Jt. Swelling, pain, limp or strain/sprain. Denies Falls. Skin: Denies puritis, rash, hives, warts, acne, eczema or change in skin lesion Neuro: No weakness, tremor, incoordination, spasms, paresthesia or pain Psychiatric: Denies confusion, memory loss or sensory loss. Denies Depression. Endocrine: Denies change in weight, skin, hair change, nocturia, and paresthesia, diabetic polys, visual blurring or hyper / hypo glycemic episodes.  Heme/Lymph: No excessive bleeding, bruising or enlarged lymph nodes.  Physical Exam  BP 132/80   Pulse 60   Temp 97.6 F (36.4 C)   Resp 16   Ht 6' 2" (1.88 m)   Wt 246 lb 3.2 oz (111.7 kg)   BMI 31.61 kg/m   General Appearance: Well nourished and well groomed and in no apparent distress.  Eyes: PERRLA, EOMs, conjunctiva no swelling or erythema, normal fundi and vessels. Sinuses: No frontal/maxillary tenderness ENT/Mouth: EACs patent / TMs  nl. Nares clear without erythema, swelling, mucoid exudates. Oral hygiene is good. No erythema, swelling, or  exudate. Tongue normal, non-obstructing. Tonsils not swollen or erythematous. Hearing normal.  Neck: Supple, thyroid not palpable. No bruits, nodes or JVD. Respiratory: Respiratory effort normal.  BS equal and clear bilateral without rales, rhonci, wheezing or stridor. Cardio: Heart sounds are normal with regular rate and rhythm and no murmurs, rubs or gallops. Peripheral pulses are normal and equal bilaterally without edema. No aortic or femoral bruits. Chest: symmetric with normal excursions and percussion.  Abdomen: Soft, with Nl bowel sounds. Nontender, no guarding, rebound, hernias, masses, or organomegaly.  Lymphatics: Non tender without lymphadenopathy.  Musculoskeletal: Full ROM all peripheral extremities, joint stability, 5/5 strength  and sl limp to the Rt.  gait. Skin: Warm and dry without rashes, lesions, cyanosis, clubbing or  ecchymosis.  Neuro: Cranial nerves intact, reflexes equal bilaterally. Normal muscle tone, no cerebellar symptoms. Sensation intact.  Pysch: Alert and oriented X 3 with normal affect, insight and judgment appropriate.   Assessment and Plan  1. Annual Preventative/Screening Exam   2. Labile hypertension  - EKG 12-Lead - [place], RETROPERITNL ABD,  LTD - Urinalysis, Routine w reflex  microscopic - Microalbumin / creatinine urine ratio - CBC with Differential/Platelet - COMPLETE METABOLIC PANEL WITH GFR - Magnesium - TSH  3. Hyperlipidemia, mixed  - EKG 12-Lead - [place], RETROPERITNL ABD,  LTD - Lipid panel - TSH  4. Prediabetes  - EKG 12-Lead - [place], RETROPERITNL ABD,  LTD - Hemoglobin A1c - Insulin, random  5. Vitamin D deficiency  - VITAMIN D 25 Hydroxyl 6. Testosterone deficiency  - Testosterone  7. Screening for ischemic heart disease  - EKG 12-Lead  8. Smoker  - EKG 12-Lead - [place], RETROPERITNL ABD,  LTD  9. Screening for AAA (aortic abdominal aneurysm)  - [place], RETROPERITNL ABD,  LTD  10. Prostate cancer screening  - PSA  11.  Screening for colorectal cancer  - POC Hemoccult Bld/Stl   12. Screening-pulmonary TB  - TB Skin Test  13. Fatigue  - Iron,Total/Total Iron Binding Cap - Vitamin B12 - CBC with Differential/Platelet - TSH - Testosterone  14. Medication management  - Urinalysis, Routine w reflex microscopic - Microalbumin / creatinine urine ratio - CBC with Differential/Platelet - COMPLETE METABOLIC PANEL WITH GFR - Magnesium - Lipid panel - TSH - Hemoglobin A1c - Insulin, random - VITAMIN D 25 Hydroxyl        Patient is felt stable for Anticipated surgery with routine perioperative monitoring and does not need cardiology clearance.        Patient was counseled in prudent diet, weight control to achieve/maintain BMI less than 25, BP monitoring, regular exercise and medications as discussed.  Discussed med effects and SE's. Routine screening labs and tests as requested with regular follow-up as recommended. Over 40 minutes of exam, counseling, chart review and high complex critical decision making was performed

## 2018-12-07 LAB — COMPLETE METABOLIC PANEL WITH GFR
AG RATIO: 1.6 (calc) (ref 1.0–2.5)
ALBUMIN MSPROF: 3.8 g/dL (ref 3.6–5.1)
ALKALINE PHOSPHATASE (APISO): 93 U/L (ref 40–115)
ALT: 14 U/L (ref 9–46)
AST: 15 U/L (ref 10–35)
BILIRUBIN TOTAL: 0.8 mg/dL (ref 0.2–1.2)
BUN: 12 mg/dL (ref 7–25)
CHLORIDE: 108 mmol/L (ref 98–110)
CO2: 30 mmol/L (ref 20–32)
Calcium: 9.1 mg/dL (ref 8.6–10.3)
Creat: 0.92 mg/dL (ref 0.70–1.33)
GFR, EST AFRICAN AMERICAN: 110 mL/min/{1.73_m2} (ref >=60)
GFR, Est Non African American: 95 mL/min/{1.73_m2} (ref >=60)
GLOBULIN: 2.4 g/dL (ref 1.9–3.7)
Glucose, Bld: 100 mg/dL — ABNORMAL HIGH (ref 65–99)
POTASSIUM: 4.4 mmol/L (ref 3.5–5.3)
SODIUM: 143 mmol/L (ref 135–146)
TOTAL PROTEIN: 6.2 g/dL (ref 6.1–8.1)

## 2018-12-07 LAB — URINALYSIS, ROUTINE W REFLEX MICROSCOPIC
BILIRUBIN URINE: NEGATIVE
GLUCOSE, UA: NEGATIVE
HGB URINE DIPSTICK: NEGATIVE
Ketones, ur: NEGATIVE
LEUKOCYTES UA: NEGATIVE
Nitrite: NEGATIVE
PROTEIN: NEGATIVE
Specific Gravity, Urine: 1.022 (ref 1.001–1.03)
pH: 7 (ref 5.0–8.0)

## 2018-12-07 LAB — IRON, TOTAL/TOTAL IRON BINDING CAP
%SAT: 56 % — AB (ref 20–48)
IRON: 157 ug/dL (ref 50–180)
TIBC: 281 ug/dL (ref 250–425)

## 2018-12-07 LAB — HEMOGLOBIN A1C
HEMOGLOBIN A1C: 5.3 %{Hb} (ref <5.7)
MEAN PLASMA GLUCOSE: 105 (calc)
eAG (mmol/L): 5.8 (calc)

## 2018-12-07 LAB — CBC WITH DIFFERENTIAL/PLATELET
Absolute Monocytes: 901 cells/uL (ref 200–950)
BASOS ABS: 64 {cells}/uL (ref 0–200)
Basophils Relative: 0.7 %
EOS PCT: 0.1 %
Eosinophils Absolute: 9 cells/uL — ABNORMAL LOW (ref 15–500)
HCT: 45.4 % (ref 38.5–50.0)
Hemoglobin: 16.4 g/dL (ref 13.2–17.1)
LYMPHS ABS: 1820 {cells}/uL (ref 850–3900)
MCH: 37.2 pg — ABNORMAL HIGH (ref 27.0–33.0)
MCHC: 36.1 g/dL — AB (ref 32.0–36.0)
MCV: 102.9 fL — ABNORMAL HIGH (ref 80.0–100.0)
MONOS PCT: 9.9 %
MPV: 10.5 fL (ref 7.5–12.5)
NEUTROS ABS: 6306 {cells}/uL (ref 1500–7800)
NEUTROS PCT: 69.3 %
Platelets: 293 10*3/uL (ref 140–400)
RBC: 4.41 10*6/uL (ref 4.20–5.80)
RDW: 12.2 % (ref 11.0–15.0)
Total Lymphocyte: 20 %
WBC: 9.1 10*3/uL (ref 3.8–10.8)

## 2018-12-07 LAB — MAGNESIUM: MAGNESIUM: 1.9 mg/dL (ref 1.5–2.5)

## 2018-12-07 LAB — LIPID PANEL
Cholesterol: 102 mg/dL (ref <200)
HDL: 56 mg/dL (ref >40)
LDL Cholesterol (Calc): 31 mg/dL (calc)
NON-HDL CHOLESTEROL (CALC): 46 mg/dL (ref <130)
Total CHOL/HDL Ratio: 1.8 (calc) (ref <5.0)
Triglycerides: 71 mg/dL (ref <150)

## 2018-12-07 LAB — MICROALBUMIN / CREATININE URINE RATIO
CREATININE, URINE: 170 mg/dL (ref 20–320)
MICROALB UR: 1.2 mg/dL
Microalb Creat Ratio: 7 mcg/mg creat (ref <30)

## 2018-12-07 LAB — PSA: PSA: 0.4 ng/mL (ref <=4.0)

## 2018-12-07 LAB — INSULIN, RANDOM: INSULIN: 5.8 u[IU]/mL (ref 2.0–19.6)

## 2018-12-07 LAB — VITAMIN D 25 HYDROXY (VIT D DEFICIENCY, FRACTURES): VIT D 25 HYDROXY: 23 ng/mL — AB (ref 30–100)

## 2018-12-07 LAB — VITAMIN B12: Vitamin B-12: 337 pg/mL (ref 200–1100)

## 2018-12-07 LAB — TESTOSTERONE: Testosterone: 265 ng/dL (ref 250–827)

## 2018-12-07 LAB — TSH: TSH: 2.1 m[IU]/L (ref 0.40–4.50)

## 2018-12-09 LAB — TB SKIN TEST
Induration: 0 mm
TB Skin Test: NEGATIVE

## 2019-01-03 ENCOUNTER — Other Ambulatory Visit: Payer: Self-pay

## 2019-01-03 DIAGNOSIS — Z1212 Encounter for screening for malignant neoplasm of rectum: Principal | ICD-10-CM

## 2019-01-03 DIAGNOSIS — Z1211 Encounter for screening for malignant neoplasm of colon: Secondary | ICD-10-CM

## 2019-01-03 LAB — POC HEMOCCULT BLD/STL (HOME/3-CARD/SCREEN)
Card #3 Fecal Occult Blood, POC: NEGATIVE
FECAL OCCULT BLD: NEGATIVE
Fecal Occult Blood, POC: NEGATIVE

## 2019-01-04 DIAGNOSIS — Z1211 Encounter for screening for malignant neoplasm of colon: Secondary | ICD-10-CM | POA: Diagnosis not present

## 2019-07-10 ENCOUNTER — Encounter: Payer: Self-pay | Admitting: *Deleted

## 2019-12-20 ENCOUNTER — Encounter: Payer: Self-pay | Admitting: Internal Medicine

## 2020-10-30 NOTE — Progress Notes (Signed)
   History of Present Illness:      This very nice 54 y.o. MWM with  labile HTN, HLD, Prediabetes and Vitamin D  Deficiency presents for evaluation of  6-8 month hx/o bilat shoulder and hand/finger pains. Patient is s/p bilat THR's.  Medications  .  aspirin 81 MG tablet, Take  daily.   Problem list He has Vitamin D deficiency; Hypertension; Prediabetes; Medication management; Screening cholesterol level; Degenerative joint disease (DJD) of hip; and BMI 35.0-35.9,adult on their problem list.   Observations/Objective:   BP 126/90   Pulse 60   Temp 97.6 F (36.4 C)   Resp 16   Ht 6\' 2"  (1.88 m)   Wt 245 lb (111.1 kg)   SpO2 97%   BMI 31.46 kg/m   HEENT - WNL. Neck - supple.  Chest - Clear equal BS. Cor - Nl HS. RRR w/o sig MGR. PP 1(+). No edema. MS- FROM w/lateral splaying of fingers at PIP's.  No synovitis. Gait Nl. Neuro -  Nl w/o focal abnormalities.  Assessment and Plan:  1. Arthralgia of both hands  - Sedimentation rate - Anti-DNA antibody, double-stranded - Cyclic citrul peptide antibody, IgG - Anti-Smith antibody - Rheumatoid factor  - dexamethasone 4 MG ; Take 1 tab 3 x day - 3 days, then 2 x day - 3 days, then 1 tab daily  Disp: 20   2. Pain of both shoulder joints  - Sedimentation rate - Anti-DNA antibody, double-stranded - Cyclic citrul peptide antibody, IgG - Anti-Smith antibody - Rheumatoid factor  - dexamethasone  4 MG ; Take 1 tab 3 x day - 3 days, then 2 x day - 3 days, then 1 tab daily  Disp: 20     Follow Up Instructions:   2 week OV        I discussed the assessment and treatment plan with the patient. The patient was provided an opportunity to ask questions and all were answered. The patient agreed with the plan and demonstrated an understanding of the instructions.        The patient was advised to call back or seek an in-person evaluation if the symptoms worsen or if the condition fails to improve as anticipated.   Kirtland Bouchard,  MD

## 2020-10-31 ENCOUNTER — Ambulatory Visit: Payer: BC Managed Care – PPO | Admitting: Internal Medicine

## 2020-10-31 ENCOUNTER — Other Ambulatory Visit: Payer: Self-pay

## 2020-10-31 VITALS — BP 126/90 | HR 60 | Temp 97.6°F | Resp 16 | Ht 74.0 in | Wt 245.0 lb

## 2020-10-31 DIAGNOSIS — M25511 Pain in right shoulder: Secondary | ICD-10-CM

## 2020-10-31 DIAGNOSIS — M25512 Pain in left shoulder: Secondary | ICD-10-CM | POA: Diagnosis not present

## 2020-10-31 DIAGNOSIS — M25541 Pain in joints of right hand: Secondary | ICD-10-CM | POA: Diagnosis not present

## 2020-10-31 DIAGNOSIS — M25542 Pain in joints of left hand: Secondary | ICD-10-CM

## 2020-10-31 MED ORDER — DEXAMETHASONE 4 MG PO TABS: ORAL_TABLET | ORAL | 0 refills | Status: DC

## 2020-11-01 ENCOUNTER — Ambulatory Visit: Payer: BC Managed Care – PPO | Admitting: Adult Health

## 2020-11-01 LAB — RHEUMATOID FACTOR: Rhuematoid fact SerPl-aCnc: <14 IU/mL (ref <14)

## 2020-11-01 LAB — ANTI-SMITH ANTIBODY: ENA SM Ab Ser-aCnc: <1 AI

## 2020-11-01 LAB — CYCLIC CITRUL PEPTIDE ANTIBODY, IGG: Cyclic Citrullin Peptide Ab: <16 UNITS

## 2020-11-01 LAB — ANTI-DNA ANTIBODY, DOUBLE-STRANDED: ds DNA Ab: <1 IU/mL

## 2020-11-01 LAB — SEDIMENTATION RATE: Sed Rate: 2 mm/h (ref 0–20)

## 2020-11-02 NOTE — Progress Notes (Signed)
========================================================== -   Test results slightly outside the reference range are not unusual. If there is anything important, I will review this with you,  otherwise it is considered normal test values.  If you have further questions,  please do not hesitate to contact me at the office or via My Chart.  ==========================================================  -  The blood tests for immune or allergic types of Arthritis like Rheumatoid Arthritis and Lupus returned Negative & Normal   - Which means your arthritic pains in your hands & finger joints is due to the common "wear & tear " aging associated arthritis also known as  - DJD = degenerative Joint Disease   - or   OA = Osteoarthritis   - So hopefully the Rx for Decadron helped settle this down  - If your pain returns , then can send ina Rx for Meloxicam.

## 2020-11-17 NOTE — Patient Instructions (Signed)

## 2020-11-17 NOTE — Progress Notes (Signed)
_  History of Present Illness:       This very nice 54 y.o.  MWM returns for 2 week f/u follow up for c/o  6-8 month hx/o bilat shoulder and hand/finger pains.  He was treated with a Decadron pulse  /taper & relates that it only helped the 1st 1&1/2 days.  Patient is s/p bilat THR's. He's still c/o pains Rt >>Lt hip and has intentions to see his Orthopedist for ongoing Left knee pains. Patient has also been followed in the past with labile HTN, HLD, Pre-Diabetes and Vitamin D Deficiency.       Patient is followed for labile  HTN (circa 2013) & BP has been controlled at home. Today's BP: 130/86. Patient has had no complaints of any cardiac type chest pain, palpitations, dyspnea / orthopnea / PND, dizziness, claudication, or dependent edema.      Patient's Lipids have been controlled with diet. Last Lipids were at goal:  Lab Results  Component Value Date   CHOL 102 12/06/2018   HDL 56 12/06/2018   LDLCALC 31 12/06/2018   TRIG 71 12/06/2018   CHOLHDL 1.8 12/06/2018    Also, the patient has moderate Obesity (BMI 31+) and is monitored expectantly for PreDiabetes  (A1c 5.5% /2011 &  A1c 5.8% /Insulin 46 /2014)  and has had no symptoms of reactive hypoglycemia, diabetic polys, paresthesias or visual blurring.  Last A1c was Normal & at goal:  Lab Results  Component Value Date   HGBA1C 5.3 12/06/2018           Patient has hx/o Testosterone  Deficiency ("138" /2014) and declined treatment for perceived lack of need.         Further, the patient also has history of Vitamin D Deficiency ("34" /2015) and supplements vitamin D without any suspected side-effects. Last vitamin D was still very low)  Lab Results  Component Value Date   VD25OH 23 (L) 12/06/2018    Current Outpatient Medications on File Prior to Visit  Medication Sig  . aspirin 81 MG tablet Take 81 mg by mouth daily.  Marland Kitchen OTC - bone strengthening vitamin(?) Takes daily  . VITAMIN D  ? Dose  Takes 2 capsules daily    No  Known Allergies  PMHx:   Past Medical History:  Diagnosis Date  . GERD (gastroesophageal reflux disease)   . Hypertension   . Prediabetes     Immunization History  Administered Date(s) Administered  . Influenza Split 10/24/2014  . Influenza-Unspecified 09/20/2018, 09/23/2020  . PFIZER SARS-COV-2 Vaccination 03/21/2020, 04/11/2020  . PPD Test 10/24/2014, 12/06/2018  . Pneumococcal Conjugate-13 10/24/2014  . Pneumococcal-Unspecified 10/20/2013  . Tdap 10/20/2013    Past Surgical History:  Procedure Laterality Date  . KNEE ARTHROSCOPY WITH LATERAL MENISECTOMY Left 09/01/2017   Procedure: ARTHROSCOPY LEFT KNEE, DEBRIDEMENT OF CHONDROMALACIA;  Surgeon: Frederik Pear, MD;  Location: [facility];  Service: Orthopedics;  Laterality: Left;  . SPLENECTOMY  1994  . TOTAL HIP ARTHROPLASTY      FHx:    Reviewed / unchanged  SHx:    Reviewed / unchanged   Systems Review:  Constitutional: Denies fever, chills, wt changes, headaches, insomnia, fatigue, night sweats, change in appetite. Eyes: Denies redness, blurred vision, diplopia, discharge, itchy, watery eyes.  ENT: Denies discharge, congestion, post nasal drip, epistaxis, sore throat, earache, hearing loss, dental pain, tinnitus, vertigo, sinus pain, snoring.  CV: Denies chest pain, palpitations, irregular heartbeat, syncope, dyspnea, diaphoresis, orthopnea, PND, claudication or  edema. Respiratory: denies cough, dyspnea, DOE, pleurisy, hoarseness, laryngitis, wheezing.  Gastrointestinal: Denies dysphagia, odynophagia, heartburn, reflux, water brash, abdominal pain or cramps, nausea, vomiting, bloating, diarrhea, constipation, hematemesis, melena, hematochezia  or hemorrhoids. Genitourinary: Denies dysuria, frequency, urgency, nocturia, hesitancy, discharge, hematuria or flank pain. Musculoskeletal: Denies arthralgias, myalgias, stiffness, jt. swelling, pain, limping or strain/sprain.  Skin: Denies pruritus, rash, hives,  warts, acne, eczema or change in skin lesion(s). Neuro: No weakness, tremor, incoordination, spasms, paresthesia or pain. Psychiatric: Denies confusion, memory loss or sensory loss. Endo: Denies change in weight, skin or hair change.  Heme/Lymph: No excessive bleeding, bruising or enlarged lymph nodes.  Physical Exam  BP 130/86   Pulse 84   Temp (!) 97.5 F (36.4 C)   Ht 6\' 2"  (1.88 m)   Wt 244 lb 3.2 oz (110.8 kg)   SpO2 97%   BMI 31.35 kg/m   Appears  well nourished, well groomed  and in no distress.  Eyes: PERRLA, EOMs, conjunctiva no swelling or erythema. Sinuses: No frontal/maxillary tenderness ENT/Mouth: EAC's clear, TM's nl w/o erythema, bulging. Nares clear w/o erythema, swelling, exudates. Oropharynx clear without erythema or exudates. Oral hygiene is good. Tongue normal, non obstructing. Hearing intact.  Neck: Supple. Thyroid not palpable. Car 2+/2+ without bruits, nodes or JVD. Chest: Respirations nl with BS clear & equal w/o rales, rhonchi, wheezing or stridor.  Cor: Heart sounds normal w/ regular rate and rhythm without sig. murmurs, gallops, clicks or rubs. Peripheral pulses normal and equal  without edema.  Abdomen: Soft & bowel sounds normal. Non-tender w/o guarding, rebound, hernias, masses or organomegaly.  Lymphatics: Unremarkable.  Musculoskeletal: Full ROM all peripheral extremities, joint stability, 5/5 strength and normal gait.  Skin: Warm, dry without exposed rashes, lesions or ecchymosis apparent.  Neuro: Cranial nerves intact, reflexes equal bilaterally. Sensory-motor testing grossly intact. Tendon reflexes grossly intact.  Pysch: Alert & oriented x 3.  Insight and judgement nl & appropriate. No ideations.  Assessment and Plan:  1. Labile hypertension  - Continue medication, monitor blood pressure at home.  - Continue DASH diet.  Reminder to go to the ER if any CP,  SOB, nausea, dizziness, severe HA, changes vision/speech.  - Urinalysis, Routine w  reflex microscopic - Microalbumin / creatinine urine ratio - CBC with Differential/Platelet - COMPLETE METABOLIC PANEL WITH GFR - Magnesium - TSH  2. Lipid screening  - Continue diet/meds, exercise,& lifestyle modifications.  - Continue monitor periodic cholesterol/liver & renal functions   - Lipid panel - TSH  3. Abnormal glucose  - Continue diet, exercise  - Lifestyle modifications.  - Monitor appropriate labs.  - Hemoglobin A1c - Insulin, random  4. Vitamin D deficiency  - Continue supplementation.  - VITAMIN D 25 Hydroxy  5. Prediabetes  - Hemoglobin A1c - Insulin, random  6. Testosterone deficiency  - Testosterone  7. Arthralgia of both hands  - Rx for Meloxicam 15 mg - 1/2 to 1 tablet Daily  &   Also recommended take Tylenol 1,000 mg     4 x /day   8. Screening for colorectal cancer  - POC Hemoccult Bld/Stl   9. Prostate cancer screening  - PSA  10. Fatigue  - Iron,Total/Total Iron Binding Cap - Vitamin B12 - Testosterone - CBC with Differential/Platelet - TSH  11. Medication management  - Urinalysis, Routine w reflex microscopic - Microalbumin / creatinine urine ratio - CBC with Differential/Platelet - COMPLETE METABOLIC PANEL WITH GFR - Magnesium - Lipid panel - TSH - Hemoglobin A1c -  Insulin, random - VITAMIN D 25 Hydroxy        Discussed  regular exercise, BP monitoring, weight control to achieve/maintain BMI less than 25 and discussed med and SE's. Recommended labs to assess and monitor clinical status with further disposition pending results of labs.  I discussed the assessment and treatment plan with the patient. The patient was provided an opportunity to ask questions and all were answered. The patient agreed with the plan and demonstrated an understanding of the instructions.  I provided over 30 minutes of exam, counseling, chart review and  complex critical decision making.       The patient was advised to call back or seek  an in-person evaluation if the symptoms worsen or if the condition fails to improve as anticipated.   Kirtland Bouchard, MD

## 2020-11-18 ENCOUNTER — Encounter: Payer: Self-pay | Admitting: Internal Medicine

## 2020-11-18 ENCOUNTER — Ambulatory Visit: Payer: BC Managed Care – PPO | Admitting: Internal Medicine

## 2020-11-18 ENCOUNTER — Other Ambulatory Visit: Payer: Self-pay

## 2020-11-18 VITALS — BP 130/86 | HR 84 | Temp 97.5°F | Ht 74.0 in | Wt 244.2 lb

## 2020-11-18 DIAGNOSIS — Z125 Encounter for screening for malignant neoplasm of prostate: Secondary | ICD-10-CM

## 2020-11-18 DIAGNOSIS — E559 Vitamin D deficiency, unspecified: Secondary | ICD-10-CM | POA: Diagnosis not present

## 2020-11-18 DIAGNOSIS — R0989 Other specified symptoms and signs involving the circulatory and respiratory systems: Secondary | ICD-10-CM

## 2020-11-18 DIAGNOSIS — R5383 Other fatigue: Secondary | ICD-10-CM | POA: Diagnosis not present

## 2020-11-18 DIAGNOSIS — R7303 Prediabetes: Secondary | ICD-10-CM

## 2020-11-18 DIAGNOSIS — R7309 Other abnormal glucose: Secondary | ICD-10-CM

## 2020-11-18 DIAGNOSIS — Z79899 Other long term (current) drug therapy: Secondary | ICD-10-CM | POA: Diagnosis not present

## 2020-11-18 DIAGNOSIS — Z1212 Encounter for screening for malignant neoplasm of rectum: Secondary | ICD-10-CM

## 2020-11-18 DIAGNOSIS — M25542 Pain in joints of left hand: Secondary | ICD-10-CM

## 2020-11-18 DIAGNOSIS — Z13220 Encounter for screening for lipoid disorders: Secondary | ICD-10-CM | POA: Diagnosis not present

## 2020-11-18 DIAGNOSIS — E349 Endocrine disorder, unspecified: Secondary | ICD-10-CM

## 2020-11-18 DIAGNOSIS — M25541 Pain in joints of right hand: Secondary | ICD-10-CM

## 2020-11-18 MED ORDER — MELOXICAM 15 MG PO TABS: ORAL_TABLET | ORAL | 0 refills | Status: DC

## 2020-11-19 LAB — MICROALBUMIN / CREATININE URINE RATIO
Creatinine, Urine: 154 mg/dL (ref 20–320)
Microalb Creat Ratio: 12 mcg/mg creat (ref <30)
Microalb, Ur: 1.8 mg/dL

## 2020-11-19 LAB — CBC WITH DIFFERENTIAL/PLATELET
Absolute Monocytes: 700 cells/uL (ref 200–950)
Basophils Absolute: 38 cells/uL (ref 0–200)
Basophils Relative: 0.3 %
Eosinophils Absolute: 75 cells/uL (ref 15–500)
Eosinophils Relative: 0.6 %
HCT: 47.6 % (ref 38.5–50.0)
Hemoglobin: 16.2 g/dL (ref 13.2–17.1)
Lymphs Abs: 1550 cells/uL (ref 850–3900)
MCH: 34.3 pg — ABNORMAL HIGH (ref 27.0–33.0)
MCHC: 34 g/dL (ref 32.0–36.0)
MCV: 100.8 fL — ABNORMAL HIGH (ref 80.0–100.0)
MPV: 11 fL (ref 7.5–12.5)
Monocytes Relative: 5.6 %
Neutro Abs: 10138 cells/uL — ABNORMAL HIGH (ref 1500–7800)
Neutrophils Relative %: 81.1 %
Platelets: 220 10*3/uL (ref 140–400)
RBC: 4.72 10*6/uL (ref 4.20–5.80)
RDW: 12 % (ref 11.0–15.0)
Total Lymphocyte: 12.4 %
WBC: 12.5 10*3/uL — ABNORMAL HIGH (ref 3.8–10.8)

## 2020-11-19 LAB — LIPID PANEL
Cholesterol: 136 mg/dL (ref <200)
HDL: 72 mg/dL (ref >=40)
LDL Cholesterol (Calc): 47 mg/dL (calc)
Non-HDL Cholesterol (Calc): 64 mg/dL (calc) (ref <130)
Total CHOL/HDL Ratio: 1.9 (calc) (ref <5.0)
Triglycerides: 90 mg/dL (ref <150)

## 2020-11-19 LAB — COMPLETE METABOLIC PANEL WITH GFR
AG Ratio: 1.6 (calc) (ref 1.0–2.5)
ALT: 25 U/L (ref 9–46)
AST: 14 U/L (ref 10–35)
Albumin: 3.8 g/dL (ref 3.6–5.1)
Alkaline phosphatase (APISO): 75 U/L (ref 35–144)
BUN: 21 mg/dL (ref 7–25)
CO2: 28 mmol/L (ref 20–32)
Calcium: 9.3 mg/dL (ref 8.6–10.3)
Chloride: 106 mmol/L (ref 98–110)
Creat: 1.02 mg/dL (ref 0.70–1.33)
GFR, Est African American: 95 mL/min/{1.73_m2} (ref >=60)
GFR, Est Non African American: 82 mL/min/{1.73_m2} (ref >=60)
Globulin: 2.4 g/dL (calc) (ref 1.9–3.7)
Glucose, Bld: 172 mg/dL — ABNORMAL HIGH (ref 65–99)
Potassium: 4.1 mmol/L (ref 3.5–5.3)
Sodium: 139 mmol/L (ref 135–146)
Total Bilirubin: 0.7 mg/dL (ref 0.2–1.2)
Total Protein: 6.2 g/dL (ref 6.1–8.1)

## 2020-11-19 LAB — TESTOSTERONE: Testosterone: 186 ng/dL — ABNORMAL LOW (ref 250–827)

## 2020-11-19 LAB — HEMOGLOBIN A1C
Hgb A1c MFr Bld: 5.6 % of total Hgb (ref <5.7)
Mean Plasma Glucose: 114 (calc)
eAG (mmol/L): 6.3 (calc)

## 2020-11-19 LAB — INSULIN, RANDOM: Insulin: 106.3 u[IU]/mL — ABNORMAL HIGH

## 2020-11-19 LAB — URINALYSIS, ROUTINE W REFLEX MICROSCOPIC
Bacteria, UA: NONE SEEN /HPF
Bilirubin Urine: NEGATIVE
Glucose, UA: NEGATIVE
Hgb urine dipstick: NEGATIVE
Hyaline Cast: NONE SEEN /LPF
Ketones, ur: NEGATIVE
Nitrite: NEGATIVE
Protein, ur: NEGATIVE
Specific Gravity, Urine: 1.022 (ref 1.001–1.03)
Squamous Epithelial / HPF: NONE SEEN /HPF (ref <=5)
pH: 6 (ref 5.0–8.0)

## 2020-11-19 LAB — IRON, TOTAL/TOTAL IRON BINDING CAP
%SAT: 60 % (calc) — ABNORMAL HIGH (ref 20–48)
Iron: 179 ug/dL (ref 50–180)
TIBC: 299 mcg/dL (calc) (ref 250–425)

## 2020-11-19 LAB — VITAMIN D 25 HYDROXY (VIT D DEFICIENCY, FRACTURES): Vit D, 25-Hydroxy: 64 ng/mL (ref 30–100)

## 2020-11-19 LAB — TSH: TSH: 1.05 mIU/L (ref 0.40–4.50)

## 2020-11-19 LAB — PSA: PSA: 0.34 ng/mL (ref <=4.0)

## 2020-11-19 LAB — MAGNESIUM: Magnesium: 1.8 mg/dL (ref 1.5–2.5)

## 2020-11-19 LAB — VITAMIN B12: Vitamin B-12: 283 pg/mL (ref 200–1100)

## 2020-11-19 NOTE — Progress Notes (Signed)
========================================================== -   Test results slightly outside the reference range are not unusual. If there is anything important, I will review this with you,  otherwise it is considered normal test values.  If you have further questions,  please do not hesitate to contact me at the office or via My Chart.  ==========================================================  -  Iron is OK  ==========================================================  -   -  Vitamin B12 =   283 -    Very Low  (Ideal or Goal Vit B12 is between 450 - 1,100)   Low Vit B12 may be associated with Anemia , Fatigue,   Peripheral Neuropathy, Dementia, "Brain Fog", & Depression  - Recommend take a sub-lingual form of Vitamin B12 tablet   1,000 to 5,000 mcg tab that you dissolve under your tongue /Daily   - Can get Baron Sane - best price at [employer] or on [employer] ==========================================================  -  PSA - prostate test is Normal & OK  ==========================================================  -  Testosterone level is low - Suggest take Zinc 50 mg tablet Daily to help raise Testosterone level naturally  ==========================================================  -  Blood sugar = 172 mg% - Elevated  (Normal or Ideal is less than 100 mg%)  Blood sugar is elevated in the borderline and  early or pre-diabetes range which has the same   300% increased risk for heart attack, stroke, cancer and   Alzheimer- type vascular dementia as full blown diabetes.    But the good news is that diet, exercise with  weight loss can cure the early diabetes at this point. ==========================================================  -  It is very important that you work harder with diet by  avoiding all foods that are white except chicken,   fish & calliflower.  - Avoid white rice  (brown & wild rice is OK),   - Avoid white potatoes  (sweet potatoes in moderation is  OK),   White bread or wheat bread or anything made out of   white flour like bagels, donuts, rolls, buns, biscuits, cakes,  - pastries, cookies, pizza crust, and pasta (made from  white flour & egg whites)   - vegetarian pasta or spinach or wheat pasta is OK.  - Multigrain breads like Arnold's, Pepperidge Farm or   multigrain sandwich thins or high fiber breads like   [place] bread or "Dave's Killer" breads that are  4 to 5 grams fiber per slice !  are best.    Diet, exercise and weight loss can reverse and cure  diabetes in the early stages.   ==========================================================  -  Total Chol = 136 and LDL Cho = 47 - Both  Excellent   - Very low risk for Heart Attack  / Stroke ========================================================  - A1c = 5.6% - Normal - Means not full blown Diabetes yet ==========================================================  -  Vitamin D = 64 - Excellent  ==========================================================  -  All Else - CBC - Kidneys - Electrolytes - Liver - Magnesium & Thyroid    - all  Normal / OK ==========================================================   - Keep up the [place] work  and work a little harder on weight loss.  ==========================================================

## 2020-12-05 ENCOUNTER — Other Ambulatory Visit (HOSPITAL_COMMUNITY): Payer: Self-pay | Admitting: Orthopedic Surgery

## 2020-12-05 ENCOUNTER — Other Ambulatory Visit: Payer: Self-pay | Admitting: Orthopedic Surgery

## 2020-12-05 DIAGNOSIS — M25551 Pain in right hip: Secondary | ICD-10-CM

## 2020-12-05 DIAGNOSIS — Z96641 Presence of right artificial hip joint: Secondary | ICD-10-CM

## 2020-12-19 ENCOUNTER — Encounter (HOSPITAL_COMMUNITY): Payer: Self-pay

## 2020-12-19 ENCOUNTER — Encounter (HOSPITAL_COMMUNITY): Payer: BC Managed Care – PPO

## 2020-12-27 ENCOUNTER — Encounter (HOSPITAL_COMMUNITY)
Admission: RE | Admit: 2020-12-27 | Discharge: 2020-12-27 | Disposition: A | Discharge status: Home / Self Care | Payer: BC Managed Care – PPO | Source: Ambulatory Visit | Attending: Orthopedic Surgery | Admitting: Orthopedic Surgery

## 2020-12-27 ENCOUNTER — Ambulatory Visit (HOSPITAL_COMMUNITY)
Admission: RE | Admit: 2020-12-27 | Discharge: 2020-12-27 | Disposition: A | Discharge status: Home / Self Care | Payer: BC Managed Care – PPO | Source: Ambulatory Visit | Attending: Orthopedic Surgery | Admitting: Orthopedic Surgery

## 2020-12-27 ENCOUNTER — Other Ambulatory Visit: Payer: Self-pay

## 2020-12-27 DIAGNOSIS — Z96641 Presence of right artificial hip joint: Secondary | ICD-10-CM | POA: Diagnosis not present

## 2020-12-27 DIAGNOSIS — M25551 Pain in right hip: Secondary | ICD-10-CM | POA: Diagnosis present

## 2020-12-27 MED ORDER — TECHNETIUM TC 99M MEDRONATE IV KIT
20.0000 | PACK | Freq: Once | INTRAVENOUS | Status: AC | PRN
  Administered 2020-12-27: 22 via INTRAVENOUS

## 2021-02-09 ENCOUNTER — Other Ambulatory Visit: Payer: Self-pay | Admitting: Internal Medicine

## 2021-02-11 ENCOUNTER — Ambulatory Visit (INDEPENDENT_AMBULATORY_CARE_PROVIDER_SITE_OTHER): Payer: BC Managed Care – PPO | Admitting: Adult Health Nurse Practitioner

## 2021-02-11 ENCOUNTER — Encounter: Payer: Self-pay | Admitting: Adult Health Nurse Practitioner

## 2021-02-11 ENCOUNTER — Other Ambulatory Visit: Payer: Self-pay

## 2021-02-11 VITALS — BP 136/88 | HR 52 | Temp 97.3°F | Wt 244.0 lb

## 2021-02-11 DIAGNOSIS — M79672 Pain in left foot: Secondary | ICD-10-CM | POA: Diagnosis not present

## 2021-02-11 DIAGNOSIS — G5762 Lesion of plantar nerve, left lower limb: Secondary | ICD-10-CM | POA: Diagnosis not present

## 2021-02-11 DIAGNOSIS — R0989 Other specified symptoms and signs involving the circulatory and respiratory systems: Secondary | ICD-10-CM | POA: Diagnosis not present

## 2021-02-11 MED ORDER — PREDNISONE 10 MG (21) PO TBPK: ORAL_TABLET | Freq: Every day | ORAL | 0 refills | Status: DC

## 2021-02-11 NOTE — Patient Instructions (Addendum)
We sent in Prednisone taper to your pharmacy.  Take this medication with food.  Continue to ice and elevate your foot when not walking.    If you do not have improvement let us know, you may ned evaluation from [facility] for further treatment.      Morton Neuralgia  Morton neuralgia is foot pain that affects the ball of the foot and the area near the toes. Morton neuralgia occurs when part of a nerve in the foot (digital nerve) is under too much pressure (compressed). When this happens over a long period of time, the nerve can thicken (neuroma) and cause pain. Pain usually occurs between the third and fourth toes.  Morton neuralgia can come and go but may get worse over time. What are the causes? This condition is caused by doing the same things over and over with your foot, such as:  Activities such as running or jumping.  Wearing shoes that are too tight. What increases the risk? You may be at higher risk for Morton neuralgia if you:  Are male.  Wear high heels.  Wear shoes that are narrow or tight.  Do activities that repeatedly stretch your toes, such as: ? Running. ? [place]. ? Long-distance walking. What are the signs or symptoms? The first symptom of Morton neuralgia is pain that spreads from the ball of the foot to the toes. It may feel like you are walking on a marble. Pain usually gets worse with walking and goes away at night. Other symptoms may include numbness and cramping of your toes. Both feet are equally affected, but rarely at the same time. How is this diagnosed? This condition is diagnosed based on your symptoms, your medical history, and a physical exam. Your health care provider may:  Squeeze your foot just behind your toe.  Ask you to move your toes to check for pain.  Ask about your physical activity level. You also may have imaging tests, such as an X-ray, ultrasound, or MRI. How is this treated? Treatment depends on how severe your  condition is and what causes it. Treatment may involve:  Wearing different shoes that are not too tight, are low-heeled, and provide good support. For some people, this is the only treatment needed.  Wearing an over-the-counter or custom supportive pad (orthotic) under the front of your foot.  Getting injections of numbing medicine and anti-inflammatory medicine (steroid) in the nerve.  Having surgery to remove part of the thickened nerve. Follow these instructions at home: Managing pain, stiffness, and swelling  Massage your foot as needed.  Wear orthotics as told by your health care provider.  If directed, put ice on your foot: ? Put ice in a plastic bag. ? Place a towel between your skin and the bag. ? Leave the ice on for 20 minutes, 2-3 times a day.  Avoid activities that cause pain or make pain worse. If you play sports, ask your health care provider when it is safe for you to return to sports.  Raise (elevate) your foot above the level of your heart while lying down and, when possible, while sitting.   General instructions  Take over-the-counter and prescription medicines only as told by your health care provider.  Do not drive or use heavy machinery while taking prescription pain medicine.  Wear shoes that: ? Have soft soles. ? Have a wide toe area. ? Provide arch support. ? Do not pinch or squeeze your feet. ? Have room for your  orthotics, if applicable.  Keep all follow-up visits as told by your health care provider. This is important. Contact a health care provider if:  Your symptoms get worse or do not get better with treatment and home care. Summary  Morton neuralgia is foot pain that affects the ball of the foot and the area near the toes. Pain usually occurs between the third and fourth toes, gets worse with walking, and goes away at night.  Morton neuralgia occurs when part of a nerve in the foot (digital nerve) is under too much pressure. When this  happens over a long period of time, the nerve can thicken (neuroma) and cause pain.  This condition is caused by doing the same things over and over with your foot, such as running or jumping, wearing shoes that are too tight, or wearing high heels.  Treatment may involve wearing low-heeled shoes that are not too tight, wearing a supportive pad (orthotic) under the front of your foot, getting injections in the nerve, or having surgery to remove part of the thickened nerve. This information is not intended to replace advice given to you by your health care provider. Make sure you discuss any questions you have with your health care provider. Document Revised: 12/21/2017 Document Reviewed: 12/21/2017 Elsevier Patient Education  2021 [employer].

## 2021-02-11 NOTE — Progress Notes (Addendum)
Addendum: 02/27/21: Patient reports improvement on prednisone but starting to return.  Would like referral to podiatry.   Assessment and Plan:  Erik Quinn was seen today for foot swelling.  Diagnoses and all orders for this visit:  Left foot pain Morton's neuroma of left foot -     predniSONE (STERAPRED UNI-PAK 21 TAB) 10 MG (21) TBPK tablet; Take by mouth daily. Follow directions on Taper pack  Discussed care, ice to area.  Also discussed padding, insole to help. Consider [facility] referral if no improvement?  Labile hypertension Controlled today      Further disposition pending results of labs. Discussed med's effects and SE's.   Over 20 minutes of face to face exam, counseling, chart review, and critical decision making was performed.   Future Appointments  Date Time Provider [place]  12/03/2021 10:00 AM Unk Pinto, MD [facility] None    ------------------------------------------------------------------------------------------------------------------   HPI 55 y.o.male presents for evaluation of his left foot.  He denise any specific trauma.  H moves furniture for a living.  He is having pain on th left great metatarsale joint.  Anterior distal worsening.  Does have 1+ pitting edema.  He reports that he feels like there is a hole under his foot.  It does feel tight with pain.  It is 9/10, elevating helps.  He has not missed any work but it is slowing him down during the day.  The pain does not wake him at night.  Comparison to right foot, also edematous.  1/2 pack of cigarettes a day.  He also drinks a 12pack a week.    Past Medical History:  Diagnosis Date  . GERD (gastroesophageal reflux disease)   . Hypertension   . Prediabetes      No Known Allergies  Current Outpatient Medications on File Prior to Visit  Medication Sig  . aspirin 81 MG tablet Take 81 mg by mouth daily.  Marland Kitchen OVER THE COUNTER MEDICATION Takes bone strengthening vitamin  daily  . VITAMIN D PO Take by mouth. Takes 2 capsules daily   No current facility-administered medications on file prior to visit.    ROS: all negative except above.   Physical Exam:  BP 136/88   Pulse (!) 52   Temp (!) 97.3 F (36.3 C)   Wt 244 lb (110.7 kg)   SpO2 96%   BMI 31.33 kg/m   General Appearance: Well nourished, in no apparent distress. Eyes: PERRLA, EOMs, conjunctiva no swelling or erythema Sinuses: No Frontal/maxillary tenderness Respiratory: Respiratory effort normal, BS equal bilaterally without rales, rhonchi, wheezing or stridor.  Cardio: RRR with no MRGs. Brisk peripheral pulses without edema.  Abdomen: Soft, + BS.  Non tender, no guarding, rebound, hernias, masses. Lymphatics: Non tender without lymphadenopathy.  Musculoskeletal: Full ROM, 5/5 strength, normal gait.  Point tenderness  Between 3rd & 4th metatarsal. Firm, marble size neuroma noted. Skin: Warm, dry without rashes, lesions, ecchymosis.  Neuro: Cranial nerves intact. Normal muscle tone, no cerebellar symptoms. Sensation intact.  Psych: Awake and oriented X 3, normal affect, Insight and Judgment appropriate.      Garnet Sierras, Laqueta Jean, DNP [facility] 02/11/2021  9:27 AM

## 2021-02-26 DIAGNOSIS — D3613 Benign neoplasm of peripheral nerves and autonomic nervous system of lower limb, including hip: Secondary | ICD-10-CM

## 2021-02-27 NOTE — Addendum Note (Signed)
Addended byGarnet Sierras A on: 02/27/2021 01:46 PM   Modules accepted: Orders

## 2021-02-27 NOTE — Telephone Encounter (Signed)
lvm requested referral  had been sent and to expect a call to schedule the consult.  Please call our office for more details.

## 2021-02-27 NOTE — Progress Notes (Unsigned)
lvm referral requested sent. Please call office for more details

## 2021-03-06 ENCOUNTER — Ambulatory Visit: Payer: BC Managed Care – PPO | Admitting: Podiatry

## 2021-03-06 ENCOUNTER — Encounter: Payer: Self-pay | Admitting: Podiatry

## 2021-03-06 ENCOUNTER — Other Ambulatory Visit: Payer: Self-pay | Admitting: Podiatry

## 2021-03-06 ENCOUNTER — Other Ambulatory Visit: Payer: Self-pay

## 2021-03-06 ENCOUNTER — Ambulatory Visit (INDEPENDENT_AMBULATORY_CARE_PROVIDER_SITE_OTHER): Payer: BC Managed Care – PPO

## 2021-03-06 DIAGNOSIS — M778 Other enthesopathies, not elsewhere classified: Secondary | ICD-10-CM

## 2021-03-06 DIAGNOSIS — G5762 Lesion of plantar nerve, left lower limb: Secondary | ICD-10-CM

## 2021-03-06 MED ORDER — METHYLPREDNISOLONE 4 MG PO TBPK: ORAL_TABLET | ORAL | 0 refills | Status: DC

## 2021-03-06 MED ORDER — MELOXICAM 15 MG PO TABS: 15.0000 mg | ORAL_TABLET | Freq: Every day | ORAL | 3 refills | Status: DC

## 2021-03-06 MED ORDER — TRIAMCINOLONE ACETONIDE 40 MG/ML IJ SUSP
20.0000 mg | Freq: Once | INTRAMUSCULAR | Status: AC
  Administered 2021-03-06: 20 mg

## 2021-03-06 NOTE — Progress Notes (Signed)
  Subjective:  Patient ID: Erik Quinn, male    DOB: [birth year],  MRN: [id number] HPI Chief Complaint  Patient presents with  . Foot Pain    Plantar forefoot left - aching x 6 months, puffy, hurts to walk a lot, tried elevating  . New Patient (Initial Visit)    55 y.o. male presents with the above complaint.   ROS: Denies fever chills nausea vomit muscle aches pains calf pain back pain chest pain shortness of breath.  Past Medical History:  Diagnosis Date  . GERD (gastroesophageal reflux disease)   . Hypertension   . Prediabetes    Past Surgical History:  Procedure Laterality Date  . KNEE ARTHROSCOPY WITH LATERAL MENISECTOMY Left 09/01/2017   Procedure: ARTHROSCOPY LEFT KNEE, DEBRIDEMENT OF CHONDROMALACIA;  Surgeon: Frederik Pear, MD;  Location: [facility];  Service: Orthopedics;  Laterality: Left;  . SPLENECTOMY  1994  . TOTAL HIP ARTHROPLASTY      Current Outpatient Medications:  .  meloxicam (MOBIC) 15 MG tablet, Take 1 tablet (15 mg total) by mouth daily., Disp: 30 tablet, Rfl: 3 .  methylPREDNISolone (MEDROL DOSEPAK) 4 MG TBPK tablet, 6 day dose pack - take as directed, Disp: 21 tablet, Rfl: 0 .  aspirin 81 MG tablet, Take 81 mg by mouth daily., Disp: , Rfl:  .  OVER THE COUNTER MEDICATION, Takes bone strengthening vitamin daily, Disp: , Rfl:  .  VITAMIN D PO, Take by mouth. Takes 2 capsules daily, Disp: , Rfl:   No Known Allergies Review of Systems Objective:  There were no vitals filed for this visit.  General: Well developed, nourished, in no acute distress, alert and oriented x3   Dermatological: Skin is warm, dry and supple bilateral. Nails x 10 are well maintained; remaining integument appears unremarkable at this time. There are no open sores, no preulcerative lesions, no rash or signs of infection present.  Vascular: Dorsalis Pedis artery and Posterior Tibial artery pedal pulses are 2/4 bilateral with immedate capillary fill time. Pedal hair  growth present. No varicosities and no lower extremity edema present bilateral.   Neruologic: Grossly intact via light touch bilateral. Vibratory intact via tuning fork bilateral. Protective threshold with Semmes Wienstein monofilament intact to all pedal sites bilateral. Patellar and Achilles deep tendon reflexes 2+ bilateral. No Babinski or clonus noted bilateral.   Musculoskeletal: No gross boney pedal deformities bilateral. No pain, crepitus, or limitation noted with foot and ankle range of motion bilateral. Muscular strength 5/5 in all groups tested bilateral.  Pain on range of motion of the second metatarsophalangeal joint left.  Mild edema.  Gait: Unassisted, Nonantalgic.    Radiographs:  Radiographs taken today demonstrate an osseously mature individual no fractures are identified.  Assessment & Plan:   Assessment: Capsulitis second metatarsophalangeal joint left.  Plan: Injected dorsally between the second and third metatarsophalangeal joints 10 mg Kenalog 5 mg Marcaine.  Started him on methylprednisolone to be followed by meloxicam.  And discussed appropriate shoe gear stretching exercise ice therapy and shoe gear modifications.     [name], [place]

## 2021-04-08 ENCOUNTER — Ambulatory Visit (INDEPENDENT_AMBULATORY_CARE_PROVIDER_SITE_OTHER): Payer: BC Managed Care – PPO

## 2021-04-08 ENCOUNTER — Other Ambulatory Visit: Payer: Self-pay | Admitting: Podiatry

## 2021-04-08 ENCOUNTER — Encounter: Payer: Self-pay | Admitting: Podiatry

## 2021-04-08 ENCOUNTER — Other Ambulatory Visit: Payer: Self-pay

## 2021-04-08 ENCOUNTER — Ambulatory Visit: Payer: BC Managed Care – PPO | Admitting: Podiatry

## 2021-04-08 DIAGNOSIS — S92335A Nondisplaced fracture of third metatarsal bone, left foot, initial encounter for closed fracture: Secondary | ICD-10-CM | POA: Diagnosis not present

## 2021-04-08 DIAGNOSIS — M778 Other enthesopathies, not elsewhere classified: Secondary | ICD-10-CM

## 2021-04-09 NOTE — Progress Notes (Signed)
He presents today for follow-up of his capsulitis left foot.  States that he really has not gotten much better.  States that the foot is still sore and swollen.  Objective: Vital signs are stable alert and oriented x3.  Evaluating the third metatarsal today he still has pain directly over the third metatarsal and third metatarsal phalangeal joint.  Radiographs were taken once again today to try to rule out fracture.  It was noted today that it does appear that he has a fracture of the head of the third metatarsal it is comminuted but nondisplaced.  I went back and looked at his previous radiographs and it was impossible to tell if there was a fracture at that point or not.  So it really has just started to show up after this long period of time.  Assessment: Fracture third metatarsal head.  Plan: I expressed to him that this needs to be immobilized and dispensed a cam walker for him today.  He states [place] and that this is going to be nearly impossible and that it is currently hard for him to wear this boot at any point time I expressed to him that this is the only way this is going to heal quickly especially since this is a long as it has been.  He understands this but still leaves disappointed that this is not something more simple.  I have scheduled him to follow-up with him in 1 month.Marland Kitchen

## 2021-04-11 ENCOUNTER — Telehealth: Payer: Self-pay | Admitting: Podiatry

## 2021-04-11 NOTE — Telephone Encounter (Signed)
Patient called and stated that the boot he received was doing fine the first day he had it. He stated yesterday he had to pump it up every hour and he wanted to know what he could do. Please call patient

## 2021-04-11 NOTE — Telephone Encounter (Signed)
Lvm for patient to call back/come in to the office to get another boot

## 2021-04-14 ENCOUNTER — Telehealth: Payer: Self-pay | Admitting: Podiatry

## 2021-04-14 NOTE — Telephone Encounter (Signed)
Pt left message on 4.22 stating the boot he received was working fine but after wearing all day he now has to pump it up every hour.  I did look in chart and it appears he was taken care of but I did leave a message for pt to call if he was not or if he needs anything else.

## 2021-04-16 ENCOUNTER — Telehealth: Payer: Self-pay | Admitting: Podiatry

## 2021-04-16 NOTE — Telephone Encounter (Signed)
Called,no answer. left vmessage for patient to return call back. Dr Stephenie Acres nurse also left message  that patient may come in and get a new boot if he is still having problems

## 2021-04-16 NOTE — Telephone Encounter (Signed)
Pt left message stating he picked up a new boot today and it is messing up as well. Please call pt to discuss. Pt stated not sure if it is to deflate when walking.

## 2021-04-26 ENCOUNTER — Other Ambulatory Visit: Payer: Self-pay | Admitting: Physician Assistant

## 2021-05-03 ENCOUNTER — Other Ambulatory Visit: Payer: Self-pay | Admitting: Physician Assistant

## 2021-05-11 ENCOUNTER — Other Ambulatory Visit: Payer: Self-pay | Admitting: Physician Assistant

## 2021-05-20 ENCOUNTER — Encounter: Payer: Self-pay | Admitting: Podiatry

## 2021-05-20 ENCOUNTER — Ambulatory Visit: Payer: BC Managed Care – PPO | Admitting: Podiatry

## 2021-05-20 ENCOUNTER — Ambulatory Visit (INDEPENDENT_AMBULATORY_CARE_PROVIDER_SITE_OTHER): Payer: BC Managed Care – PPO

## 2021-05-20 ENCOUNTER — Other Ambulatory Visit: Payer: Self-pay

## 2021-05-20 DIAGNOSIS — S92335D Nondisplaced fracture of third metatarsal bone, left foot, subsequent encounter for fracture with routine healing: Secondary | ICD-10-CM | POA: Diagnosis not present

## 2021-05-20 NOTE — Progress Notes (Signed)
He presents today states that he is not doing a whole lot better as he refers to the fracture of the third metatarsal of the left foot according to what he told the nurse.  However once I discussed this with him he states that he is actually doing much better and the toes feel pretty good except if it notices that there is sort of starting to curl.  Objective: Vital signs are stable he is alert and oriented x3.  There is no erythema no edema cellulitis drainage or odor he has minimal tenderness on palpation of the third metatarsal head.  Radiographs taken today do not demonstrate any significant displacement though there is some periostitis of the third metatarsal head right at the subchondral bone line.  This does appear to be healing but not yet healed.  Assessment: Resolving fracture third metatarsal left foot.  Plan: At this point I recommended that he continue his cam walker for another 3 to 4 weeks.  Offered him to follow-up but he declined.  I will follow-up with him on an as-needed basis.

## 2021-10-14 ENCOUNTER — Other Ambulatory Visit: Payer: Self-pay

## 2021-10-14 ENCOUNTER — Ambulatory Visit (INDEPENDENT_AMBULATORY_CARE_PROVIDER_SITE_OTHER): Payer: BC Managed Care – PPO | Admitting: Internal Medicine

## 2021-10-14 VITALS — BP 164/100 | HR 91 | Temp 97.6°F | Resp 16 | Ht 74.0 in | Wt 247.4 lb

## 2021-10-14 DIAGNOSIS — G44209 Tension-type headache, unspecified, not intractable: Secondary | ICD-10-CM | POA: Diagnosis not present

## 2021-10-14 DIAGNOSIS — J301 Allergic rhinitis due to pollen: Secondary | ICD-10-CM | POA: Diagnosis not present

## 2021-10-14 MED ORDER — DEXAMETHASONE 4 MG PO TABS: ORAL_TABLET | ORAL | 0 refills | Status: DC

## 2021-10-14 MED ORDER — PSEUDOEPHEDRINE HCL ER 120 MG PO TB12: ORAL_TABLET | ORAL | 3 refills | Status: DC

## 2021-10-14 NOTE — Progress Notes (Signed)
    Future Appointments  Date Time Provider [place]  10/14/2021  3:00 PM Unk Pinto, MD [facility] None  12/03/2021 10:00 AM Unk Pinto, MD [facility] None    History of Present Illness:     Patient is a very nice 55 yo WM presenting with c/o HA    Medications  Patient not taking: Reported on 10/14/2021)     (Patient not taking: Reported on 10/14/2021)    Takes bone strengthening vitamin daily   VITAMIN D - Takes 2 capsules daily  Problem list He has Vitamin D deficiency; Hypertension; Prediabetes; Medication management; Screening cholesterol level; Degenerative joint disease (DJD) of hip; and BMI 35.0-35.9,adult on their problem list.   Observations/Objective:  BP (!) 164/100   Pulse 91   Temp 97.6 F (36.4 C)   Resp 16   Ht 6\' 2"  (1.88 m)   Wt 247 lb 6.4 oz (112.2 kg)   SpO2 96%   BMI 31.76 kg/m   HEENT - Sl tender fronto- occipital areas .TMs Nl. No TMJt tenderness. N/O/P -clear                  - PERRLA. EOMs conj . CN 2-12 -  Nl.  Neck - supple.  Chest - Clear equal BS. Cor - Nl HS. RRR w/o sig MGR. PP 1(+). No edema. MS- FROM w/o deformities.  Gait Nl. Neuro -  Nl w/o focal abnormalities.  Assessment and Plan:  1. Muscle contraction headache  - dexamethasone  4 MG tablet;  Take 1 tab 3 x day - 3 days, then 2 x day - 3 days, then 1 tab daily   Dispense: 20 tablet;   2. Non-seasonal allergic rhinitis due to pollen   - dexamethasone 4 MG tablet;  Take 1 tab 3 x day - 3 days, then 2 x day - 3 days, then 1 tab daily   Dispense: 20 tablet;\  - pseudoephedrine  120 MG 12 hr tablet; T ake  1 tablet  2 x /day (every 12 hours)  for Head and Chest Congestion   Dispense: 60 tablet; Refill: 3  Follow Up Instructions:        I discussed the assessment and treatment plan with the patient. The patient was provided an opportunity to ask questions and all were answered. The patient agreed with the plan and demonstrated an understanding of  the instructions.       The patient was advised to call back or seek an in-person evaluation if the symptoms worsen or if the condition fails to improve as anticipated.    Kirtland Bouchard, MD

## 2021-10-20 IMAGING — NM NM BONE 3 PHASE
10 series · 20 of 20 positions shown · non-contrast
Comparison: None

Radiographic correlation: None since surgery

CLINICAL DATA: Prior BILATERAL hip replacement surgery, RIGHT 7474
and LEFT 1900, having RIGHT hip pain from hip to knee for 2 months

EXAM:
NUCLEAR MEDICINE 3-PHASE BONE SCAN
TECHNIQUE: Radionuclide angiographic images, immediate static blood pool
images, and 3-hour delayed static images were obtained of the hips
after intravenous injection of radiopharmaceutical.
RADIOPHARMACEUTICALS:  22 mCi Pc-UUm MDP IV

[Series 1: flow · 2.07mm/px · 6 of 48 frames shown (1 of 2)]
[frame 5/48  full-range]
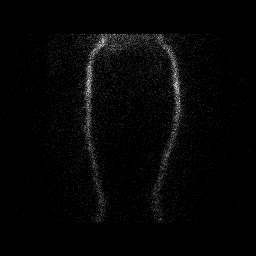
[frame 13/48  full-range]
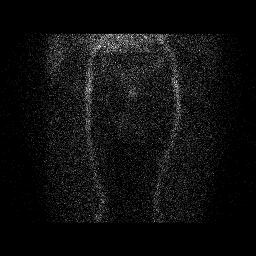
[frame 21/48  full-range]
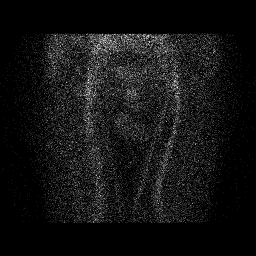
[frame 29/48  full-range]
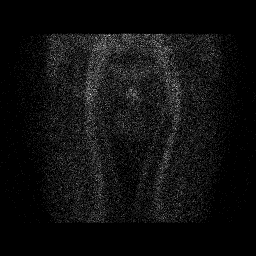
[frame 37/48  full-range]
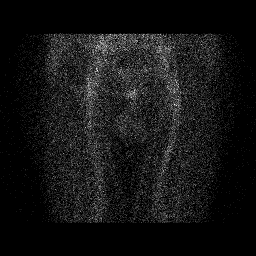
[frame 45/48  full-range]
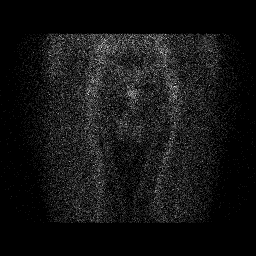

[Series 1: flow · 2.07mm/px · 6 of 48 frames shown (2 of 2)]
[frame 5/48]
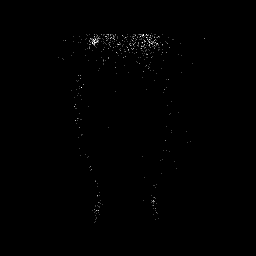
[frame 13/48  full-range]
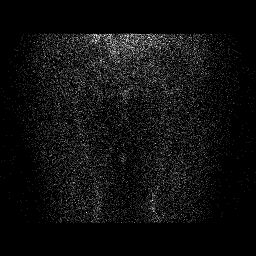
[frame 21/48  full-range]
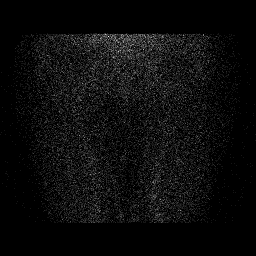
[frame 29/48  full-range]
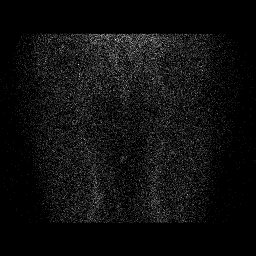
[frame 37/48  full-range]
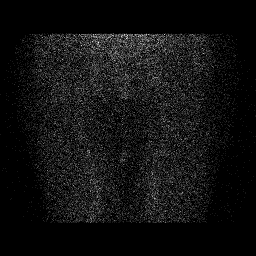
[frame 45/48  full-range]
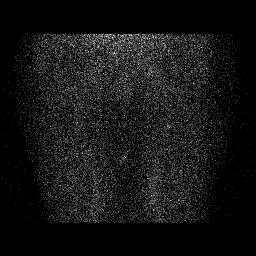

[Series 2: blood pool · 2.07mm/px · 1 of 1 slices shown (1 of 2)]
[im 1/1  full-range]
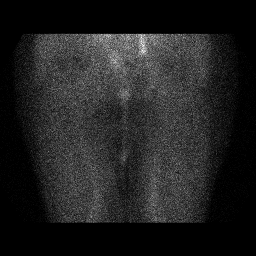

[Series 2: blood pool · 2.07mm/px · 1 of 1 slices shown (2 of 2)]
[im 1/1]
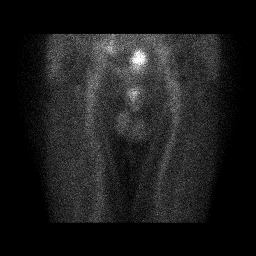

[Series 3: lat bp · 2.07mm/px · 1 of 1 slices shown (1 of 2)]
[im 1/1]
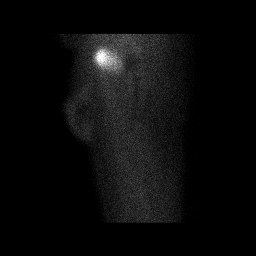

[Series 3: lat bp · 2.07mm/px · 1 of 1 slices shown (2 of 2)]
[im 1/1]
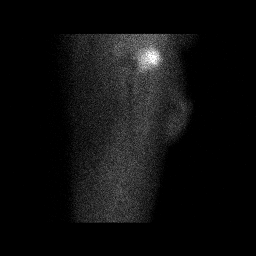

[Series 4: delay · delayed · 2.07mm/px · 1 of 1 slices shown (1 of 4)]
[im 1/1]
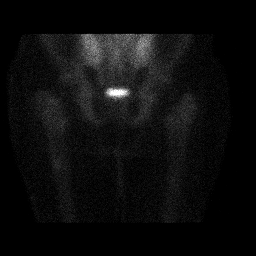

[Series 4: delay · delayed · 2.07mm/px · 1 of 1 slices shown (2 of 4)]
[im 1/1]
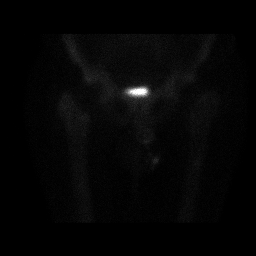

[Series 5: delay · delayed · 2.07mm/px · 1 of 1 slices shown (3 of 4)]
[im 1/1]
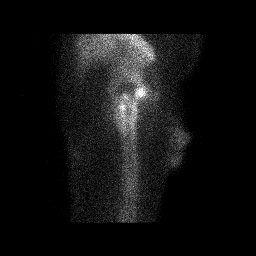

[Series 5: delay · delayed · 2.07mm/px · 1 of 1 slices shown (4 of 4)]
[im 1/1]
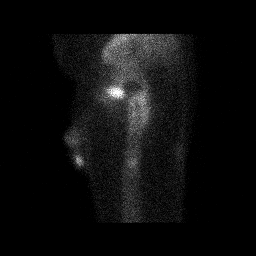

[20 of 20 positions shown; findings below may reference images not displayed]

FINDINGS: Vascular phase: Normal symmetric blood flow to both hips

Blood pool phase: Normal blood pool at both hips

Delayed phase: Photopenic defects at both hips from the prostheses.
Mild uptake of tracer adjacent to the proximal and distal aspects of
the femoral component of the LEFT hip prosthesis, a nonspecific
finding in the setting of an asymptomatic prosthesis. No abnormal
tracer uptake identified adjacent to the RIGHT hip prosthesis.
Expected tracer uptake within the pelvis.
IMPRESSION: No scintigraphic evidence of loosening or infection of the RIGHT hip
prosthesis.

Nonspecific mild tracer uptake adjacent to the proximal and distal
portions of the femoral component of the LEFT hip prosthesis,
nonspecific in an asymptomatic hip but could be related to prior
surgery.

## 2021-12-02 ENCOUNTER — Encounter: Payer: Self-pay | Admitting: Internal Medicine

## 2021-12-02 NOTE — Patient Instructions (Signed)

## 2021-12-02 NOTE — Progress Notes (Signed)
Annual  Screening/Preventative Visit  & Comprehensive Evaluation & Examination  Future Appointments  Date Time Provider Department  12/03/2021 10:00 AM Unk Pinto, MD [facility]  12/10/2022 10:00 AM Unk Pinto, MD [facility]            This very nice 55 y.o. MWM presents for a Screening /Preventative Visit & comprehensive evaluation and management of multiple medical co-morbidities.  Patient has been followed for labile HTN, HLD, Prediabetes, Morbid Obesity and Vitamin D Deficiency.       Labile HTN monitored expectantly since 2013.  Patient's BP has been controlled at home.  Today's BP is at goal - 128/80. Patient denies any cardiac symptoms as chest pain, palpitations, short of breath, dizziness or ankle swelling.       Patient's hyperlipidemia is controlled with diet. Last lipids were at goal :  Lab Results  Component Value Date   CHOL 136 11/18/2020   HDL 72 11/18/2020   LDLCALC 47 11/18/2020   TRIG 90 11/18/2020   CHOLHDL 1.9 11/18/2020         Patient has hx/o Morbid Obesity ( BMI 31+)  & prediabetes and patient denies reactive hypoglycemic symptoms, visual blurring, diabetic polys or paresthesias. 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        Finally, patient has history of Vitamin D Deficiency ("34" /2015) and last vitamin D was at goal :   Lab Results  Component Value Date   VD25OH 64 11/18/2020     Current Outpatient Medications on File Prior to Visit  Medication Sig   OTC  Takes bone strengthening vitamin daily   VITAMIN D  - off  Takes 2 capsules daily /  ? Dose       No Known Allergies   Past Medical History:  Diagnosis Date   GERD (gastroesophageal reflux disease)    Hypertension    Prediabetes      Health Maintenance  Topic Date Due   HIV Screening  Never done    Pneumococcal Vaccine 63-41 Years old (2 - PPSV23 if available, else PCV20) 10/25/2015   Zoster Vaccines- Shingrix (2 of 2) 11/19/2021   TETANUS/TDAP  10/21/2023   COLONOSCOPY (Pts 45-54yr Insurance coverage will need to be confirmed)  08/25/2026   INFLUENZA VACCINE  Completed   COVID-19 Vaccine  Completed   Hepatitis C Screening  Completed   HPV VACCINES  Aged Out     Immunization History  Administered Date(s) Administered   Influenza Split 10/24/2014   Influenza,inj,Quad  09/24/2021   Influenza- 09/20/2018, 09/23/2020   PFIZER ARS-COV-2 Vacc 03/21/2020, 04/11/2020, 11/23/2020   PPD Test 10/24/2014, 12/06/2018   Pfizer Covid-19 Vacc Bivalent Booster  10/29/2021   Pneumococcal -13 10/24/2014   Pneumococcal -23 10/20/2013   Tdap 10/20/2013   Zoster Recombinat (Shingrix) 09/24/2021    Last Colon - 08/25/2016 - Dr MEarlean Shawlrecc 10 yr f/u due Sept 2027.   Past Surgical History:  Procedure Laterality Date   KNEE ARTHROSCOPY WITH LATERAL MENISECTOMY Left 09/01/2017   Procedure: ARTHROSCOPY LEFT KNEE, DEBRIDEMENT OF CHONDROMALACIA;  Surgeon: RFrederik Pear MD;  Location: [facility]  Service: Orthopedics;  Laterality: Left;   SPLENECTOMY  1994   TOTAL HIP ARTHROPLASTY       Family History  Problem Relation  Age of Onset   Diabetes Mother    Sleep apnea Mother      Social History    Marital status: Married      Spouse name: NLisa   Number of children: 2 sons / 1 GD  Occupational History   Ret from city - now works for [employer]   Tobacco Use   Smoking status: Every Day    Packs/day: 0.33    Types: Cigarettes    Last attempt to quit: 11/16/2016    Years since quitting: 5.0   Smokeless tobacco: Never  Vaping Use   Vaping Use: Never used  Substance Use Topics   Alcohol use: Yes    Alcohol/week: 8.0 standard drinks    Types: 8 Standard drinks or equivalent per week   Drug use: No      ROS Constitutional: Denies fever, chills, weight  loss/gain, headaches, insomnia,  night sweats or change in appetite. Does c/o fatigue. Eyes: Denies redness, blurred vision, diplopia, discharge, itchy or watery eyes.  ENT: Denies discharge, congestion, post nasal drip, epistaxis, sore throat, earache, hearing loss, dental pain, Tinnitus, Vertigo, Sinus pain or snoring.  Cardio: Denies chest pain, palpitations, irregular heartbeat, syncope, dyspnea, diaphoresis, orthopnea, PND, claudication or edema Respiratory: denies cough, dyspnea, DOE, pleurisy, hoarseness, laryngitis or wheezing.  Gastrointestinal: Denies dysphagia, heartburn, reflux, water brash, pain, cramps, nausea, vomiting, bloating, diarrhea, constipation, hematemesis, melena, hematochezia, jaundice or hemorrhoids Genitourinary: Denies dysuria, frequency, urgency, nocturia, hesitancy, discharge, hematuria or flank pain Musculoskeletal: Denies arthralgia, myalgia, stiffness, Jt. Swelling, pain, limp or strain/sprain. Denies Falls. Skin: Denies puritis, rash, hives, warts, acne, eczema or change in skin lesion Neuro: No weakness, tremor, incoordination, spasms, paresthesia or pain Psychiatric: Denies confusion, memory loss or sensory loss. Denies Depression. Endocrine: Denies change in weight, skin, hair change, nocturia, and paresthesia, diabetic polys, visual blurring or hyper / hypo glycemic episodes.  Heme/Lymph: No excessive bleeding, bruising or enlarged lymph nodes.   Physical Exam  BP 128/80    Pulse 64    Temp 97.9 F (36.6 C)    Resp 16    Ht _0  (1.88 m)    Wt 250 lb 3.2 oz (113.5 kg)    SpO2 95%    BMI 32.12 kg/m   General Appearance: Well nourished and well groomed and in no apparent distress.  Eyes: PERRLA, EOMs, conjunctiva no swelling or erythema, normal fundi and vessels. Sinuses: No frontal/maxillary tenderness ENT/Mouth: EACs patent / TMs  nl. Nares clear without erythema, swelling, mucoid exudates. Oral hygiene is good. No erythema, swelling, or exudate.  Tongue normal, non-obstructing. Tonsils not swollen or erythematous. Hearing normal.  Neck: Supple, thyroid not palpable. No bruits, nodes or JVD. Respiratory: Respiratory effort normal.  BS equal and clear bilateral without rales, rhonci, wheezing or stridor. Cardio: Heart sounds are normal with regular rate and rhythm and no murmurs, rubs or gallops. Peripheral pulses are normal and equal bilaterally without edema. No aortic or femoral bruits. Chest: symmetric with normal excursions and percussion.  Abdomen: Soft, with Nl bowel sounds. Mid line vertical transabdominal 14" scar. Nontender, no guarding, rebound, hernias, masses, or organomegaly.  Lymphatics: Non tender without lymphadenopathy.  Musculoskeletal: Full ROM all peripheral extremities, joint stability, 5/5 strength, and normal gait. Skin: Warm and dry without rashes, lesions, cyanosis, clubbing or  ecchymosis.  Neuro: Cranial nerves intact, reflexes equal bilaterally. Normal muscle tone, no cerebellar symptoms. Sensation intact.  Pysch: Alert and oriented x 3 with normal affect, insight and judgment appropriate.  Assessment and Plan  1. Annual Preventative/Screening Exam    2. Labile hypertension  - EKG 12-Lead - [place], RETROPERITNL ABD,  LTD - Urinalysis, Routine w reflex microscopic - Microalbumin / creatinine urine ratio - CBC with Differential/Platelet - COMPLETE METABOLIC PANEL WITH GFR - Magnesium - TSH  3. Lipid screening  - EKG 12-Lead - [place], RETROPERITNL ABD,  LTD - Lipid panel - TSH  4. Abnormal glucose  - EKG 12-Lead - [place], RETROPERITNL ABD,  LTD - Hemoglobin A1c - Insulin, random  5. Vitamin D deficiency  - VITAMIN D 25 Hydroxy   6. Testosterone deficiency  - Testosterone  7. Screening for colorectal cancer  - POC Hemoccult Bld/Stl   8. Prostate cancer screening  - PSA  9. Fatigue, unspecified type  - Iron, Total/Total Iron Binding Cap - Vitamin B12  10. Screening for ischemic heart  disease  - EKG 12-Lead  11. FH: hypertension  - EKG 12-Lead - [place], RETROPERITNL ABD,  LTD  12. Smoker  - EKG 12-Lead - [place], RETROPERITNL ABD,  LTD  13. Screening for AAA (aortic abdominal aneurysm)  - [place], RETROPERITNL ABD,  LTD  14. Medication management  - Urinalysis, Routine w reflex microscopic - Microalbumin / creatinine urine ratio - CBC with Differential/Platelet - COMPLETE METABOLIC PANEL WITH GFR - Magnesium - Lipid panel - TSH - Hemoglobin A1c - Insulin, random - VITAMIN D 25 Hydroxy   15. Screening-pulmonary TB  - TB Skin Test           Patient was counseled in prudent diet, weight control to achieve/maintain BMI less than 25, BP monitoring, regular exercise and medications as discussed.  Discussed med effects and SE's. Routine screening labs and tests as requested with regular follow-up as recommended. Over 40 minutes of exam, counseling, chart review and high complex critical decision making was performed   Kirtland Bouchard, MD

## 2021-12-03 ENCOUNTER — Other Ambulatory Visit: Payer: Self-pay

## 2021-12-03 ENCOUNTER — Ambulatory Visit (INDEPENDENT_AMBULATORY_CARE_PROVIDER_SITE_OTHER): Payer: BC Managed Care – PPO | Admitting: Internal Medicine

## 2021-12-03 ENCOUNTER — Encounter: Payer: Self-pay | Admitting: Internal Medicine

## 2021-12-03 VITALS — BP 128/80 | HR 64 | Temp 97.9°F | Resp 16 | Ht 74.0 in | Wt 250.2 lb

## 2021-12-03 DIAGNOSIS — Z111 Encounter for screening for respiratory tuberculosis: Secondary | ICD-10-CM

## 2021-12-03 DIAGNOSIS — R7309 Other abnormal glucose: Secondary | ICD-10-CM

## 2021-12-03 DIAGNOSIS — E349 Endocrine disorder, unspecified: Secondary | ICD-10-CM

## 2021-12-03 DIAGNOSIS — E559 Vitamin D deficiency, unspecified: Secondary | ICD-10-CM

## 2021-12-03 DIAGNOSIS — R350 Frequency of micturition: Secondary | ICD-10-CM

## 2021-12-03 DIAGNOSIS — Z1329 Encounter for screening for other suspected endocrine disorder: Secondary | ICD-10-CM

## 2021-12-03 DIAGNOSIS — R0989 Other specified symptoms and signs involving the circulatory and respiratory systems: Secondary | ICD-10-CM | POA: Diagnosis not present

## 2021-12-03 DIAGNOSIS — Z0000 Encounter for general adult medical examination without abnormal findings: Secondary | ICD-10-CM | POA: Diagnosis not present

## 2021-12-03 DIAGNOSIS — Z13220 Encounter for screening for lipoid disorders: Secondary | ICD-10-CM

## 2021-12-03 DIAGNOSIS — Z130 Encounter for screening for diseases of the blood and blood-forming organs and certain disorders involving the immune mechanism: Secondary | ICD-10-CM

## 2021-12-03 DIAGNOSIS — Z1211 Encounter for screening for malignant neoplasm of colon: Secondary | ICD-10-CM

## 2021-12-03 DIAGNOSIS — Z1389 Encounter for screening for other disorder: Secondary | ICD-10-CM

## 2021-12-03 DIAGNOSIS — F17200 Nicotine dependence, unspecified, uncomplicated: Secondary | ICD-10-CM

## 2021-12-03 DIAGNOSIS — N401 Enlarged prostate with lower urinary tract symptoms: Secondary | ICD-10-CM | POA: Diagnosis not present

## 2021-12-03 DIAGNOSIS — Z136 Encounter for screening for cardiovascular disorders: Secondary | ICD-10-CM | POA: Diagnosis not present

## 2021-12-03 DIAGNOSIS — Z125 Encounter for screening for malignant neoplasm of prostate: Secondary | ICD-10-CM | POA: Diagnosis not present

## 2021-12-03 DIAGNOSIS — Z131 Encounter for screening for diabetes mellitus: Secondary | ICD-10-CM | POA: Diagnosis not present

## 2021-12-03 DIAGNOSIS — Z8249 Family history of ischemic heart disease and other diseases of the circulatory system: Secondary | ICD-10-CM | POA: Diagnosis not present

## 2021-12-03 DIAGNOSIS — Z79899 Other long term (current) drug therapy: Secondary | ICD-10-CM

## 2021-12-03 DIAGNOSIS — R5383 Other fatigue: Secondary | ICD-10-CM

## 2021-12-03 DIAGNOSIS — Z0001 Encounter for general adult medical examination with abnormal findings: Secondary | ICD-10-CM

## 2021-12-04 ENCOUNTER — Other Ambulatory Visit: Payer: Self-pay | Admitting: Internal Medicine

## 2021-12-04 LAB — URINALYSIS, ROUTINE W REFLEX MICROSCOPIC
Bacteria, UA: NONE SEEN /HPF
Bilirubin Urine: NEGATIVE
Glucose, UA: NEGATIVE
Hgb urine dipstick: NEGATIVE
Hyaline Cast: NONE SEEN /LPF
Ketones, ur: NEGATIVE
Leukocytes,Ua: NEGATIVE
Nitrite: NEGATIVE
Specific Gravity, Urine: 1.017 (ref 1.001–1.035)
Squamous Epithelial / HPF: NONE SEEN /HPF (ref <=5)
pH: 7 (ref 5.0–8.0)

## 2021-12-04 LAB — MICROALBUMIN / CREATININE URINE RATIO
Creatinine, Urine: 141 mg/dL (ref 20–320)
Microalb Creat Ratio: 53 mcg/mg creat — ABNORMAL HIGH (ref <30)
Microalb, Ur: 7.5 mg/dL

## 2021-12-04 LAB — COMPLETE METABOLIC PANEL WITH GFR
AG Ratio: 1.5 (calc) (ref 1.0–2.5)
ALT: 14 U/L (ref 9–46)
AST: 15 U/L (ref 10–35)
Albumin: 4.1 g/dL (ref 3.6–5.1)
Alkaline phosphatase (APISO): 71 U/L (ref 35–144)
BUN: 13 mg/dL (ref 7–25)
CO2: 31 mmol/L (ref 20–32)
Calcium: 9.4 mg/dL (ref 8.6–10.3)
Chloride: 105 mmol/L (ref 98–110)
Creat: 0.97 mg/dL (ref 0.70–1.30)
Globulin: 2.8 g/dL (calc) (ref 1.9–3.7)
Glucose, Bld: 95 mg/dL (ref 65–99)
Potassium: 4.5 mmol/L (ref 3.5–5.3)
Sodium: 143 mmol/L (ref 135–146)
Total Bilirubin: 0.6 mg/dL (ref 0.2–1.2)
Total Protein: 6.9 g/dL (ref 6.1–8.1)
eGFR: 92 mL/min/{1.73_m2} (ref >=60)

## 2021-12-04 LAB — CBC WITH DIFFERENTIAL/PLATELET
Absolute Monocytes: 871 cells/uL (ref 200–950)
Basophils Absolute: 65 cells/uL (ref 0–200)
Basophils Relative: 0.5 %
Eosinophils Absolute: 156 cells/uL (ref 15–500)
Eosinophils Relative: 1.2 %
HCT: 47.9 % (ref 38.5–50.0)
Hemoglobin: 16.5 g/dL (ref 13.2–17.1)
Lymphs Abs: 2197 cells/uL (ref 850–3900)
MCH: 36 pg — ABNORMAL HIGH (ref 27.0–33.0)
MCHC: 34.4 g/dL (ref 32.0–36.0)
MCV: 104.6 fL — ABNORMAL HIGH (ref 80.0–100.0)
MPV: 10.9 fL (ref 7.5–12.5)
Monocytes Relative: 6.7 %
Neutro Abs: 9711 cells/uL — ABNORMAL HIGH (ref 1500–7800)
Neutrophils Relative %: 74.7 %
Platelets: 262 10*3/uL (ref 140–400)
RBC: 4.58 10*6/uL (ref 4.20–5.80)
RDW: 12.5 % (ref 11.0–15.0)
Total Lymphocyte: 16.9 %
WBC: 13 10*3/uL — ABNORMAL HIGH (ref 3.8–10.8)

## 2021-12-04 LAB — LIPID PANEL
Cholesterol: 149 mg/dL (ref <200)
HDL: 82 mg/dL (ref >=40)
LDL Cholesterol (Calc): 49 mg/dL (calc)
Non-HDL Cholesterol (Calc): 67 mg/dL (calc) (ref <130)
Total CHOL/HDL Ratio: 1.8 (calc) (ref <5.0)
Triglycerides: 98 mg/dL (ref <150)

## 2021-12-04 LAB — IRON, TOTAL/TOTAL IRON BINDING CAP
%SAT: 32 % (calc) (ref 20–48)
Iron: 110 ug/dL (ref 50–180)
TIBC: 343 mcg/dL (calc) (ref 250–425)

## 2021-12-04 LAB — HEMOGLOBIN A1C
Hgb A1c MFr Bld: 5.5 % of total Hgb (ref <5.7)
Mean Plasma Glucose: 111 mg/dL
eAG (mmol/L): 6.2 mmol/L

## 2021-12-04 LAB — VITAMIN D 25 HYDROXY (VIT D DEFICIENCY, FRACTURES): Vit D, 25-Hydroxy: 39 ng/mL (ref 30–100)

## 2021-12-04 LAB — TESTOSTERONE: Testosterone: 72 ng/dL — ABNORMAL LOW (ref 250–827)

## 2021-12-04 LAB — MICROSCOPIC MESSAGE

## 2021-12-04 LAB — PSA: PSA: 0.37 ng/mL (ref <=4.00)

## 2021-12-04 LAB — TSH: TSH: 1.75 mIU/L (ref 0.40–4.50)

## 2021-12-04 LAB — VITAMIN B12: Vitamin B-12: 263 pg/mL (ref 200–1100)

## 2021-12-04 LAB — INSULIN, RANDOM: Insulin: 10.7 u[IU]/mL

## 2021-12-04 LAB — MAGNESIUM: Magnesium: 1.9 mg/dL (ref 1.5–2.5)

## 2021-12-04 NOTE — Progress Notes (Signed)
============================================================ °============================================================ ° °-    Iron Normal  ============================================================ ============================================================  -   -  Vitamin B12 =   263   -   Very Low  (Ideal or Goal Vit B12 is between 450 - 1,100)   Low Vit B12 may be associated with Anemia , Fatigue,   Peripheral Neuropathy, Dementia, "Brain Fog",  Impotence, Psychosis & Depression   - Recommend take a sub-lingual form of Vitamin B12 tablet   1,000 to 5,000 mcg tab that you dissolve under your tongue /Daily   - Can get Baron Sane - best price at [employer] or on [employer] ============================================================ ============================================================  -  PSA - Low - Great  ! ============================================================ ============================================================  -  CBC OK - WBC is slightly elevated from recent URI. ============================================================ ============================================================  -  Total Chol  =  149  &    LDL Chol = 49   - Both  Excellent   - Very low risk for Heart Attack  / Stroke ============================================================ ============================================================  -  A1c  - Normal - No Diabetes   - Great ! ============================================================ ============================================================  -  Vitamin D = 39 - Very very low  - Vitamin D goal is between 70-100.  ============================================================  - Please RESTART  Vitamin D & take 10,000 units /day, ie.,  TAKE  # 2 Vitamin D 5,000 unit capsules = 10,000 units  ! ! !  ============================================================  - It is very important as a natural anti-inflammatory  and helping the  immune system protect against viral infections, like the Covid-19    helping hair, skin, and nails, as well as reducing stroke and  heart attack risk.   - It helps your bones and helps with mood.  - It also decreases numerous cancer risks so please  take it as directed.   - Low Vit D is associated with a 200-300% higher risk for  CANCER   and 200-300% higher risk for HEART   ATTACK  &  STROKE.    - It is also associated with higher death rate at younger ages,   autoimmune diseases like Rheumatoid arthritis, Lupus,  Multiple Sclerosis.     - Also many other serious conditions, like depression, Alzheimer's  Dementia, infertility, muscle aches, fatigue, fibromyalgia   - just to name a few. ============================================================ ============================================================  -  All Else - CBC - Kidneys - U/A - Electrolytes - Liver - Magnesium & Thyroid    - all  Normal / OK ============================================================ ============================================================

## 2021-12-29 ENCOUNTER — Other Ambulatory Visit: Payer: Self-pay

## 2021-12-29 DIAGNOSIS — Z1211 Encounter for screening for malignant neoplasm of colon: Secondary | ICD-10-CM

## 2021-12-29 DIAGNOSIS — Z1212 Encounter for screening for malignant neoplasm of rectum: Secondary | ICD-10-CM

## 2021-12-29 LAB — POC HEMOCCULT BLD/STL (HOME/3-CARD/SCREEN)
Card #2 Fecal Occult Blod, POC: NEGATIVE
Card #3 Fecal Occult Blood, POC: NEGATIVE
Fecal Occult Blood, POC: NEGATIVE

## 2022-01-12 ENCOUNTER — Encounter: Payer: Self-pay | Admitting: Internal Medicine

## 2022-01-12 ENCOUNTER — Ambulatory Visit (INDEPENDENT_AMBULATORY_CARE_PROVIDER_SITE_OTHER): Payer: BC Managed Care – PPO | Admitting: Internal Medicine

## 2022-01-12 ENCOUNTER — Other Ambulatory Visit: Payer: Self-pay

## 2022-01-12 VITALS — BP 126/82 | HR 62 | Temp 97.9°F | Resp 16 | Ht 74.0 in | Wt 252.2 lb

## 2022-01-12 DIAGNOSIS — N433 Hydrocele, unspecified: Secondary | ICD-10-CM

## 2022-01-12 NOTE — Progress Notes (Signed)
° °  Future Appointments  Date Time Provider Department  12/10/2022 10:00 AM Unk Pinto, MD [facility]    History of Present Illness:     Patient is a very nice 56 yo MWM who presents with concerns of increasing size of a mass in his left  scrotum. Patient denies any injury or pain.    Medications   Takes bone strengthening vitamin daily   VITAMIN D , Takes 2 capsules daily  Problem list He has Vitamin D deficiency; Hypertension; Prediabetes; Medication management; Screening cholesterol level; Degenerative joint disease (DJD) of hip; and BMI 35.0-35.9,adult on their problem list.   Observations/Objective:  BP 126/82    Pulse 62    Temp 97.9 F (36.6 C)    Resp 16    Ht 6\' 2"  (1.88 m)    Wt 252 lb 3.2 oz (114.4 kg)    SpO2 95%    BMI 32.38 kg/m   GU focused exam finds no  RIH. Rt scrotum has a soft non-tender Nl sized testes.  No LIH. There is an apparent large mobile soft Left Hydrocele approx size of  tennis ball. Discrete testes could not be identified.    Assessment and Plan:  1. Left hydrocele  - Ambulatory referral to Urology  Follow Up Instructions:        I discussed the assessment and treatment plan with the patient. The patient was provided an opportunity to ask questions and all were answered. The patient agreed with the plan and demonstrated an understanding of the instructions.       The patient was advised to call back or seek an in-person evaluation if the symptoms worsen or if the condition fails to improve as anticipated.   Kirtland Bouchard, MD

## 2022-07-13 ENCOUNTER — Ambulatory Visit (INDEPENDENT_AMBULATORY_CARE_PROVIDER_SITE_OTHER): Payer: BC Managed Care – PPO | Admitting: Nurse Practitioner

## 2022-07-13 ENCOUNTER — Encounter: Payer: Self-pay | Admitting: Nurse Practitioner

## 2022-07-13 VITALS — BP 150/92 | HR 51 | Temp 97.5°F | Ht 74.0 in | Wt 259.2 lb

## 2022-07-13 DIAGNOSIS — R310 Gross hematuria: Secondary | ICD-10-CM | POA: Diagnosis not present

## 2022-07-13 DIAGNOSIS — R1030 Lower abdominal pain, unspecified: Secondary | ICD-10-CM

## 2022-07-13 DIAGNOSIS — K429 Umbilical hernia without obstruction or gangrene: Secondary | ICD-10-CM

## 2022-07-13 NOTE — Progress Notes (Signed)
Assessment and Plan:  Molly was seen today for urinary tract infection.  Diagnoses and all orders for this visit:  Gross hematuria Push fluids and monitor If returns or flank pain notify the office and will get renal CT -     Urine Culture -     Urinalysis, Routine w reflex microscopic  Lower abdominal pain Continue to monitor if U/S shows any abnormality will refer to general surgery -     US Pelvis Limited; Future -     CBC with Differential/Platelet -     COMPLETE METABOLIC PANEL WITH GFR  Umbilical hernia without obstruction and without gangrene Continue to monitor if U/S shows any abnormality will refer to general surgery        Further disposition pending results of labs. Discussed med's effects and SE's.   Over 30 minutes of exam, counseling, chart review, and critical decision making was performed.   Future Appointments  Date Time Provider [place]  12/10/2022 10:00 AM Unk Pinto, MD [facility] None    ------------------------------------------------------------------------------------------------------------------   HPI BP (!) 150/92   Pulse (!) 51   Temp (!) 97.5 F (36.4 C)   Ht '6\' 2"'$  (1.88 m)   Wt 259 lb 3.2 oz (117.6 kg)   SpO2 96%   BMI 33.28 kg/m    56 y.o.male presents for pain in lower abdomen which he describes as pressure.  States it feels like he has to have a bowel movement and will go and then the pressure remains.   3 days ago he had blood in urine x 2.  Denies frequency, urgency and pain on urination.  Has since had no blood noted in urine.  He has some intermittent left flank pain   Past Medical History:  Diagnosis Date   GERD (gastroesophageal reflux disease)    Hypertension    Prediabetes      Not on File  Current Outpatient Medications on File Prior to Visit  Medication Sig   OVER THE COUNTER MEDICATION Takes bone strengthening vitamin daily (Patient not taking: Reported on 07/13/2022)   VITAMIN D PO Take by  mouth. Takes 2 capsules daily (Patient not taking: Reported on 07/13/2022)   No current facility-administered medications on file prior to visit.    ROS: all negative except above.   Physical Exam:  BP (!) 150/92   Pulse (!) 51   Temp (!) 97.5 F (36.4 C)   Ht '6\' 2"'$  (1.88 m)   Wt 259 lb 3.2 oz (117.6 kg)   SpO2 96%   BMI 33.28 kg/m   General Appearance: Well nourished, in no apparent distress. Eyes: PERRLA, EOMs, conjunctiva no swelling or erythema Sinuses: No Frontal/maxillary tenderness ENT/Mouth: Ext aud canals clear, TMs without erythema, bulging. No erythema, swelling, or exudate on post pharynx.  Tonsils not swollen or erythematous. Hearing normal.  Neck: Supple, thyroid normal.  Respiratory: Respiratory effort normal, BS equal bilaterally without rales, rhonchi, wheezing or stridor.  Cardio: RRR with no MRGs. Brisk peripheral pulses without edema.  Abdomen: Soft, + BS.  Nontender umbilical hernia  Tenderness midline lower abdomen, no distinct hernia felt. Lymphatics: Non tender without lymphadenopathy.  Musculoskeletal: Full ROM, 5/5 strength, normal gait.  Skin: Warm, dry without rashes, lesions, ecchymosis.  Neuro: Cranial nerves intact. Normal muscle tone, no cerebellar symptoms. Sensation intact.  Psych: Awake and oriented X 3, normal affect, Insight and Judgment appropriate.     Alycia Rossetti, NP 10:02 AM [facility]

## 2022-07-13 NOTE — Patient Instructions (Signed)
YOU CAN CALL TO MAKE AN ULTRASOUND..  I have put in an order for an ultrasound for you to have You can set them up at your convenience by calling this number [phone] You will likely have the ultrasound at [address]  If you have any issues call our office and we will set this up for you.    Hematuria, Adult Hematuria is blood in the urine. Blood may be visible in the urine, or it may be identified with a test. This condition can be caused by infections of the bladder, urethra, kidney, or prostate. Other possible causes include: Kidney stones. Cancer of the urinary tract. Too much calcium in the urine. Conditions that are passed from parent to child (inherited conditions). Exercise that requires a lot of energy. Infections can usually be treated with medicine, and a kidney stone usually will pass through your urine. If neither of these is the cause of your hematuria, more tests may be needed to identify the cause of your symptoms. It is very important to tell your health care provider about any blood in your urine, even if it is painless or the blood stops without treatment. Blood in the urine, when it happens and then stops and then happens again, can be a symptom of a very serious condition, including cancer. There is no pain in the initial stages of many urinary cancers. Follow these instructions at home: Medicines Take over-the-counter and prescription medicines only as told by your health care provider. If you were prescribed an antibiotic medicine, take it as told by your health care provider. Do not stop taking the antibiotic even if you start to feel better. Eating and drinking Drink enough fluid to keep your urine pale yellow. It is recommended that you drink 3-4 quarts (2.8-3.8 L) a day. If you have been diagnosed with an infection, drinking cranberry juice in addition to large amounts of water is recommended. Avoid caffeine, tea, and carbonated beverages. These  tend to irritate the bladder. Avoid alcohol because it may irritate the prostate (in males). General instructions If you have been diagnosed with a kidney stone, follow your health care provider's instructions about straining your urine to catch the stone. Empty your bladder often. Avoid holding urine for long periods of time. If you are male: After a bowel movement, wipe from front to back and use each piece of toilet paper only once. Empty your bladder before and after sex. Pay attention to any changes in your symptoms. Tell your health care provider about any changes or any new symptoms. It is up to you to get the results of any tests. Ask your health care provider, or the department that is doing the test, when your results will be ready. Keep all follow-up visits. This is important. Contact a health care provider if: You develop back pain. You have a fever or chills. You have nausea or vomiting. Your symptoms do not improve after 3 days. Your symptoms get worse. Get help right away if: You develop severe vomiting and are unable to take medicine without vomiting. You develop severe pain in your back or abdomen even though you are taking medicine. You pass a large amount of blood in your urine. You pass blood clots in your urine. You feel very weak or like you might faint. You faint. Summary Hematuria is blood in the urine. It has many possible causes. It is very important that you tell your health care provider about any  blood in your urine, even if it is painless or the blood stops without treatment. Take over-the-counter and prescription medicines only as told by your health care provider. Drink enough fluid to keep your urine pale yellow. This information is not intended to replace advice given to you by your health care provider. Make sure you discuss any questions you have with your health care provider. Document Revised: 08/07/2020 Document Reviewed: 08/07/2020 Elsevier  Patient Education  [place].

## 2022-07-14 ENCOUNTER — Other Ambulatory Visit: Payer: Self-pay | Admitting: Nurse Practitioner

## 2022-07-14 DIAGNOSIS — R319 Hematuria, unspecified: Secondary | ICD-10-CM

## 2022-07-14 DIAGNOSIS — R1030 Lower abdominal pain, unspecified: Secondary | ICD-10-CM

## 2022-07-14 LAB — URINALYSIS, ROUTINE W REFLEX MICROSCOPIC
Bacteria, UA: NONE SEEN /HPF
Bilirubin Urine: NEGATIVE
Glucose, UA: NEGATIVE
Hyaline Cast: NONE SEEN /LPF
Ketones, ur: NEGATIVE
Leukocytes,Ua: NEGATIVE
Nitrite: NEGATIVE
Specific Gravity, Urine: 1.02 (ref 1.001–1.035)
Squamous Epithelial / HPF: NONE SEEN /HPF (ref <=5)
pH: 6 (ref 5.0–8.0)

## 2022-07-14 LAB — COMPLETE METABOLIC PANEL WITH GFR
AG Ratio: 1.4 (calc) (ref 1.0–2.5)
ALT: 15 U/L (ref 9–46)
AST: 15 U/L (ref 10–35)
Albumin: 3.9 g/dL (ref 3.6–5.1)
Alkaline phosphatase (APISO): 86 U/L (ref 35–144)
BUN: 13 mg/dL (ref 7–25)
CO2: 27 mmol/L (ref 20–32)
Calcium: 9.2 mg/dL (ref 8.6–10.3)
Chloride: 104 mmol/L (ref 98–110)
Creat: 0.96 mg/dL (ref 0.70–1.30)
Globulin: 2.7 g/dL (calc) (ref 1.9–3.7)
Glucose, Bld: 110 mg/dL — ABNORMAL HIGH (ref 65–99)
Potassium: 4.3 mmol/L (ref 3.5–5.3)
Sodium: 142 mmol/L (ref 135–146)
Total Bilirubin: 0.5 mg/dL (ref 0.2–1.2)
Total Protein: 6.6 g/dL (ref 6.1–8.1)
eGFR: 93 mL/min/{1.73_m2} (ref >=60)

## 2022-07-14 LAB — CBC WITH DIFFERENTIAL/PLATELET
Absolute Monocytes: 670 cells/uL (ref 200–950)
Basophils Absolute: 69 cells/uL (ref 0–200)
Basophils Relative: 0.9 %
Eosinophils Absolute: 231 cells/uL (ref 15–500)
Eosinophils Relative: 3 %
HCT: 49.1 % (ref 38.5–50.0)
Hemoglobin: 16.6 g/dL (ref 13.2–17.1)
Lymphs Abs: 1540 cells/uL (ref 850–3900)
MCH: 35.1 pg — ABNORMAL HIGH (ref 27.0–33.0)
MCHC: 33.8 g/dL (ref 32.0–36.0)
MCV: 103.8 fL — ABNORMAL HIGH (ref 80.0–100.0)
MPV: 11 fL (ref 7.5–12.5)
Monocytes Relative: 8.7 %
Neutro Abs: 5190 cells/uL (ref 1500–7800)
Neutrophils Relative %: 67.4 %
Platelets: 312 10*3/uL (ref 140–400)
RBC: 4.73 10*6/uL (ref 4.20–5.80)
RDW: 12.1 % (ref 11.0–15.0)
Total Lymphocyte: 20 %
WBC: 7.7 10*3/uL (ref 3.8–10.8)

## 2022-07-14 LAB — URINE CULTURE
MICRO NUMBER:: 13686412
Result:: NO GROWTH
SPECIMEN QUALITY:: ADEQUATE

## 2022-07-14 LAB — MICROSCOPIC MESSAGE

## 2022-07-15 ENCOUNTER — Ambulatory Visit
Admission: RE | Admit: 2022-07-15 | Discharge: 2022-07-15 | Disposition: A | Discharge status: Home / Self Care | Payer: BC Managed Care – PPO | Source: Ambulatory Visit | Attending: Nurse Practitioner | Admitting: Nurse Practitioner

## 2022-07-15 DIAGNOSIS — R1030 Lower abdominal pain, unspecified: Secondary | ICD-10-CM

## 2022-07-15 DIAGNOSIS — R319 Hematuria, unspecified: Secondary | ICD-10-CM

## 2022-07-16 ENCOUNTER — Other Ambulatory Visit: Payer: Self-pay | Admitting: Nurse Practitioner

## 2022-07-16 DIAGNOSIS — N200 Calculus of kidney: Secondary | ICD-10-CM

## 2022-07-17 ENCOUNTER — Other Ambulatory Visit: Payer: Self-pay | Admitting: Urology

## 2022-07-17 ENCOUNTER — Encounter (HOSPITAL_COMMUNITY): Payer: Self-pay | Admitting: Urology

## 2022-07-17 ENCOUNTER — Other Ambulatory Visit: Payer: Self-pay

## 2022-07-17 NOTE — Progress Notes (Signed)
COVID Vaccine Completed:  Yes  Date of COVID positive in last 90 days:  No  PCP - Unk Pinto, MD Cardiologist - N/A  Chest x-ray - N/A EKG - 12-03-21 Epic Stress Test - N/A ECHO - N/A Cardiac Cath - N/A Pacemaker/ICD device last checked: Spinal Cord Stimulator:N/A  Bowel Prep - N/A  Sleep Study - N/A CPAP -   Prediabetes Fasting Blood Sugar -  Checks Blood Sugar - does not check   Blood Thinner Instructions: N/A Aspirin Instructions: Last Dose:  Activity level:   Can go up a flight of stairs and perform activities of daily living without stopping and without symptoms of chest pain or shortness of breath.    Able to exercise without symptoms  Anesthesia review:  N/A  Patient denies shortness of breath, fever, cough and chest pain at PAT appointment  Patient verbalized understanding of instructions that were given to them at the PAT appointment. Patient was also instructed that they will need to review over the PAT instructions again at home before surgery.

## 2022-07-20 ENCOUNTER — Encounter (HOSPITAL_COMMUNITY): Payer: Self-pay | Admitting: Urology

## 2022-07-20 ENCOUNTER — Encounter (HOSPITAL_COMMUNITY)
Admission: RE | Disposition: A | Discharge status: Home / Self Care | Payer: Self-pay | Source: Home / Self Care | Attending: Urology

## 2022-07-20 ENCOUNTER — Ambulatory Visit (HOSPITAL_COMMUNITY): Payer: BC Managed Care – PPO | Admitting: Anesthesiology

## 2022-07-20 ENCOUNTER — Ambulatory Visit (HOSPITAL_COMMUNITY): Payer: BC Managed Care – PPO

## 2022-07-20 ENCOUNTER — Ambulatory Visit (HOSPITAL_COMMUNITY)
Admission: RE | Admit: 2022-07-20 | Discharge: 2022-07-20 | Disposition: A | Discharge status: Home / Self Care | Payer: BC Managed Care – PPO | Attending: Urology | Admitting: Urology

## 2022-07-20 DIAGNOSIS — R7303 Prediabetes: Secondary | ICD-10-CM

## 2022-07-20 DIAGNOSIS — R310 Gross hematuria: Secondary | ICD-10-CM | POA: Diagnosis not present

## 2022-07-20 DIAGNOSIS — K219 Gastro-esophageal reflux disease without esophagitis: Secondary | ICD-10-CM | POA: Insufficient documentation

## 2022-07-20 DIAGNOSIS — N201 Calculus of ureter: Secondary | ICD-10-CM | POA: Insufficient documentation

## 2022-07-20 DIAGNOSIS — I10 Essential (primary) hypertension: Secondary | ICD-10-CM | POA: Diagnosis not present

## 2022-07-20 DIAGNOSIS — F17210 Nicotine dependence, cigarettes, uncomplicated: Secondary | ICD-10-CM | POA: Insufficient documentation

## 2022-07-20 HISTORY — DX: Prediabetes: R73.03

## 2022-07-20 HISTORY — PX: CYSTOSCOPY/URETEROSCOPY/HOLMIUM LASER/STENT PLACEMENT: SHX6546

## 2022-07-20 HISTORY — DX: Personal history of urinary calculi: Z87.442

## 2022-07-20 SURGERY — CYSTOSCOPY/URETEROSCOPY/HOLMIUM LASER/STENT PLACEMENT
Anesthesia: General | Laterality: Left

## 2022-07-20 MED ORDER — PROPOFOL 10 MG/ML IV BOLUS
INTRAVENOUS | Status: AC
  Filled 2022-07-20: qty 20

## 2022-07-20 MED ORDER — SODIUM CHLORIDE 0.9 % IR SOLN
Status: DC | PRN
  Administered 2022-07-20: 9000 mL via INTRAVESICAL

## 2022-07-20 MED ORDER — MIDAZOLAM HCL 2 MG/2ML IJ SOLN
INTRAMUSCULAR | Status: AC
  Filled 2022-07-20: qty 2

## 2022-07-20 MED ORDER — OXYCODONE HCL 5 MG PO TABS
5.0000 mg | ORAL_TABLET | Freq: Once | ORAL | Status: AC | PRN
  Administered 2022-07-20: 5 mg via ORAL

## 2022-07-20 MED ORDER — CHLORHEXIDINE GLUCONATE 0.12 % MT SOLN
15.0000 mL | Freq: Once | OROMUCOSAL | Status: AC
Start: 2022-07-20 — End: 2022-07-20
  Administered 2022-07-20: 15 mL via OROMUCOSAL

## 2022-07-20 MED ORDER — CEPHALEXIN 500 MG PO CAPS: 500.0000 mg | ORAL_CAPSULE | Freq: Two times a day (BID) | ORAL | 0 refills | Status: AC

## 2022-07-20 MED ORDER — LIDOCAINE 2% (20 MG/ML) 5 ML SYRINGE
INTRAMUSCULAR | Status: DC | PRN
  Administered 2022-07-20: 100 mg via INTRAVENOUS

## 2022-07-20 MED ORDER — OXYCODONE HCL 5 MG PO TABS
ORAL_TABLET | ORAL | Status: AC
  Filled 2022-07-20: qty 1

## 2022-07-20 MED ORDER — OXYCODONE-ACETAMINOPHEN 5-325 MG PO TABS: 1.0000 | ORAL_TABLET | ORAL | 0 refills | Status: AC | PRN

## 2022-07-20 MED ORDER — CEFAZOLIN SODIUM-DEXTROSE 2-4 GM/100ML-% IV SOLN
2.0000 g | Freq: Once | INTRAVENOUS | Status: AC
  Administered 2022-07-20: 2 g via INTRAVENOUS
  Filled 2022-07-20: qty 100

## 2022-07-20 MED ORDER — ONDANSETRON HCL 4 MG/2ML IJ SOLN
INTRAMUSCULAR | Status: DC | PRN
  Administered 2022-07-20: 4 mg via INTRAVENOUS

## 2022-07-20 MED ORDER — FENTANYL CITRATE (PF) 100 MCG/2ML IJ SOLN
INTRAMUSCULAR | Status: AC
  Filled 2022-07-20: qty 2

## 2022-07-20 MED ORDER — MIDAZOLAM HCL 5 MG/5ML IJ SOLN
INTRAMUSCULAR | Status: DC | PRN
  Administered 2022-07-20: 2 mg via INTRAVENOUS

## 2022-07-20 MED ORDER — LACTATED RINGERS IV SOLN
INTRAVENOUS | Status: DC
Start: 2022-07-20 — End: 2022-07-20

## 2022-07-20 MED ORDER — OXYCODONE HCL 5 MG/5ML PO SOLN: 5.0000 mg | Freq: Once | ORAL | Status: AC | PRN

## 2022-07-20 MED ORDER — ONDANSETRON HCL 4 MG/2ML IJ SOLN: 4.0000 mg | Freq: Once | INTRAMUSCULAR | Status: DC | PRN

## 2022-07-20 MED ORDER — ORAL CARE MOUTH RINSE
15.0000 mL | Freq: Once | OROMUCOSAL | Status: AC
Start: 2022-07-20 — End: 2022-07-20

## 2022-07-20 MED ORDER — ONDANSETRON HCL 4 MG/2ML IJ SOLN
INTRAMUSCULAR | Status: AC
  Filled 2022-07-20: qty 2

## 2022-07-20 MED ORDER — IOHEXOL 300 MG/ML  SOLN
INTRAMUSCULAR | Status: DC | PRN
  Administered 2022-07-20: 10 mL

## 2022-07-20 MED ORDER — FENTANYL CITRATE PF 50 MCG/ML IJ SOSY
PREFILLED_SYRINGE | INTRAMUSCULAR | Status: AC
  Filled 2022-07-20: qty 1

## 2022-07-20 MED ORDER — DEXAMETHASONE SODIUM PHOSPHATE 10 MG/ML IJ SOLN
INTRAMUSCULAR | Status: AC
  Filled 2022-07-20: qty 1

## 2022-07-20 MED ORDER — FENTANYL CITRATE PF 50 MCG/ML IJ SOSY
25.0000 ug | PREFILLED_SYRINGE | INTRAMUSCULAR | Status: DC | PRN
  Administered 2022-07-20: 50 ug via INTRAVENOUS

## 2022-07-20 MED ORDER — KETOROLAC TROMETHAMINE 30 MG/ML IJ SOLN
30.0000 mg | Freq: Once | INTRAMUSCULAR | Status: AC | PRN
  Administered 2022-07-20: 30 mg via INTRAVENOUS

## 2022-07-20 MED ORDER — DOCUSATE SODIUM 100 MG PO CAPS: 100.0000 mg | ORAL_CAPSULE | Freq: Every day | ORAL | 0 refills | Status: DC | PRN

## 2022-07-20 MED ORDER — KETOROLAC TROMETHAMINE 30 MG/ML IJ SOLN
INTRAMUSCULAR | Status: DC
Start: 2022-07-20 — End: 2022-07-20
  Filled 2022-07-20: qty 1

## 2022-07-20 MED ORDER — DEXAMETHASONE SODIUM PHOSPHATE 10 MG/ML IJ SOLN
INTRAMUSCULAR | Status: DC | PRN
  Administered 2022-07-20: 8 mg via INTRAVENOUS

## 2022-07-20 MED ORDER — FENTANYL CITRATE (PF) 100 MCG/2ML IJ SOLN
INTRAMUSCULAR | Status: DC | PRN
  Administered 2022-07-20 (×2): 50 ug via INTRAVENOUS

## 2022-07-20 MED ORDER — LIDOCAINE HCL (PF) 2 % IJ SOLN
INTRAMUSCULAR | Status: AC
  Filled 2022-07-20: qty 5

## 2022-07-20 MED ORDER — PROPOFOL 10 MG/ML IV BOLUS
INTRAVENOUS | Status: DC | PRN
  Administered 2022-07-20: 200 mg via INTRAVENOUS

## 2022-07-20 SURGICAL SUPPLY — 22 items
BAG URO CATCHER STRL LF (MISCELLANEOUS) ×2 IMPLANT
BASKET LASER NITINOL 1.9FR (BASKET) ×1 IMPLANT
BASKET ZERO TIP NITINOL 2.4FR (BASKET) IMPLANT
BSKT STON RTRVL 120 1.9FR (BASKET) ×1
BSKT STON RTRVL ZERO TP 2.4FR (BASKET)
CATH URETL OPEN 5X70 (CATHETERS) ×2 IMPLANT
CLOTH BEACON ORANGE TIMEOUT ST (SAFETY) ×2 IMPLANT
FIBER LASER MOSES 200 DFL (Laser) IMPLANT
GLOVE BIOGEL M 7.0 STRL (GLOVE) ×2 IMPLANT
GOWN STRL REUS W/ TWL XL LVL3 (GOWN DISPOSABLE) ×1 IMPLANT
GOWN STRL REUS W/TWL XL LVL3 (GOWN DISPOSABLE) ×2
GUIDEWIRE STR DUAL SENSOR (WIRE) ×4 IMPLANT
GUIDEWIRE ZIPWRE .038 STRAIGHT (WIRE) IMPLANT
KIT TURNOVER KIT A (KITS) IMPLANT
LASER FIB FLEXIVA PULSE ID 365 (Laser) IMPLANT
MANIFOLD NEPTUNE II (INSTRUMENTS) ×2 IMPLANT
PACK CYSTO (CUSTOM PROCEDURE TRAY) ×2 IMPLANT
SHEATH URETERAL 12FR 45CM (SHEATH) IMPLANT
TRACTIP FLEXIVA PULS ID 200XHI (Laser) IMPLANT
TRACTIP FLEXIVA PULSE ID 200 (Laser) ×1 IMPLANT
TUBING CONNECTING 10 (TUBING) ×2 IMPLANT
TUBING UROLOGY SET (TUBING) ×2 IMPLANT

## 2022-07-20 NOTE — Transfer of Care (Signed)
Immediate Anesthesia Transfer of Care Note  Patient: Erik Quinn  Procedure(s) Performed: CYSTOSCOPY/URETEROSCOPY/HOLMIUM LASER/STENT PLACEMENT (Left)  Patient Location: PACU  Anesthesia Type:General  Level of Consciousness: drowsy and patient cooperative  Airway & Oxygen Therapy: Patient Spontanous Breathing and Patient connected to face mask oxygen  Post-op Assessment: Report given to RN and Post -op Vital signs reviewed and stable  Post vital signs: Reviewed and stable  Last Vitals:  Vitals Value Taken Time  BP 138/104 07/20/22 1650  Temp    Pulse 64 07/20/22 1651  Resp 11 07/20/22 1651  SpO2 100 % 07/20/22 1651  Vitals shown include unvalidated device data.  Last Pain:  Vitals:   07/20/22 1335  TempSrc:   PainSc: 0-No pain      Patients Stated Pain Goal: 3 (76/19/50 9326)  Complications: No notable events documented.

## 2022-07-20 NOTE — Anesthesia Preprocedure Evaluation (Signed)
Anesthesia Evaluation  Patient identified by MRN, date of birth, ID band Patient awake    Reviewed: Allergy & Precautions, NPO status , Patient's Chart, lab work & pertinent test results  Airway Mallampati: II  TM Distance: >3 FB Neck ROM: Full    Dental no notable dental hx.    Pulmonary Current Smoker and Patient abstained from smoking.,    Pulmonary exam normal breath sounds clear to auscultation       Cardiovascular hypertension, Normal cardiovascular exam Rhythm:Regular Rate:Normal     Neuro/Psych negative neurological ROS  negative psych ROS   GI/Hepatic Neg liver ROS, GERD  Medicated,  Endo/Other  negative endocrine ROS  Renal/GU negative Renal ROS  negative genitourinary   Musculoskeletal negative musculoskeletal ROS (+)   Abdominal   Peds negative pediatric ROS (+)  Hematology negative hematology ROS (+)   Anesthesia Other Findings   Reproductive/Obstetrics negative OB ROS                             Anesthesia Physical Anesthesia Plan  ASA: 2  Anesthesia Plan: General   Post-op Pain Management: Minimal or no pain anticipated   Induction: Intravenous  PONV Risk Score and Plan: 1 and Ondansetron, Treatment may vary due to age or medical condition and Dexamethasone  Airway Management Planned: LMA  Additional Equipment:   Intra-op Plan:   Post-operative Plan: Extubation in OR  Informed Consent: I have reviewed the patients History and Physical, chart, labs and discussed the procedure including the risks, benefits and alternatives for the proposed anesthesia with the patient or authorized representative who has indicated his/her understanding and acceptance.     Dental advisory given  Plan Discussed with: CRNA and Surgeon  Anesthesia Plan Comments:         Anesthesia Quick Evaluation

## 2022-07-20 NOTE — H&P (Signed)
Office Visit Report     07/16/2022     CC/HPI: Erik Quinn is a 56 year old male who is seen in consultation today for a left ureteral stone.   #1. Left ureteral stone:  He reports vague lower abdominal pain for the past week. He underwent CT A/P 07/15/2022 that demonstrated 7 mm stone in the left ureter vesicle junction with mild left pelviectasis and hydroureter. Today, he denies any flank pain. He does note some suprapubic abdominal pain. He has noted some gross hematuria over the past week. He states that over the past several days, this been clear yellow urine. He denies passage of clots. He does have a smoking history and smokes 1 pack/day for at least 40 years. He denies a family history of urologic malignancy. He denies prior history of urolithiasis.  -KUB today wtih inability to clearly identify ureteral stone.   2. Gross hematuria: This is likely related to his ureteral stone. He does have significant smoking history smoking 1 pack/day for over 40 years. He denies family history of urologic malignancy.     ALLERGIES: None   MEDICATIONS: None   GU PSH: None   NON-GU PSH: None   GU PMH: None   NON-GU PMH: None   FAMILY HISTORY: None   SOCIAL HISTORY: None   REVIEW OF SYSTEMS:    GU Review Male:   Patient denies frequent urination, hard to postpone urination, burning/ pain with urination, get up at night to urinate, leakage of urine, stream starts and stops, trouble starting your stream, have to strain to urinate , erection problems, and penile pain.  Gastrointestinal (Upper):   Patient denies nausea, vomiting, and indigestion/ heartburn.  Gastrointestinal (Lower):   Patient denies diarrhea and constipation.  Constitutional:   Patient denies fever, night sweats, weight loss, and fatigue.  Skin:   Patient denies skin rash/ lesion and itching.  Eyes:   Patient denies blurred vision and double vision.  Ears/ Nose/ Throat:   Patient denies sore throat and sinus problems.   Hematologic/Lymphatic:   Patient denies swollen glands and easy bruising.  Cardiovascular:   Patient denies leg swelling and chest pains.  Respiratory:   Patient denies cough and shortness of breath.  Endocrine:   Patient denies excessive thirst.  Musculoskeletal:   Patient denies back pain and joint pain.  Neurological:   Patient denies headaches and dizziness.  Psychologic:   Patient denies depression and anxiety.   VITAL SIGNS: None   MULTI-SYSTEM PHYSICAL EXAMINATION:    Constitutional: Well-nourished. No physical deformities. Normally developed. Good grooming.  Respiratory: No labored breathing, no use of accessory muscles.   Cardiovascular: Normal temperature, normal extremity pulses, no swelling, no varicosities.  Gastrointestinal: No mass, no tenderness, no rigidity, non obese abdomen. No CVA tenderness     Complexity of Data:  Source Of History:  Patient, Medical Record Summary  Records Review:   Previous Doctor Records, Previous Patient Records  X-Ray Review: C.T. Abdomen/Pelvis: Reviewed Films. Reviewed Report. Discussed With Patient.    Notes:                     CLINICAL DATA: Flank pain   EXAM:  CT ABDOMEN AND PELVIS WITHOUT CONTRAST   TECHNIQUE:  Multidetector CT imaging of the abdomen and pelvis was performed  following the standard protocol without IV contrast.   RADIATION DOSE REDUCTION: This exam was performed according to the  departmental dose-optimization program which includes automated  exposure control, adjustment of the mA and/or  kV according to  patient size and/or use of iterative reconstruction technique.   COMPARISON: CT chest, abdomen and pelvis dated February 28th 2016   FINDINGS:  Lower chest: No acute abnormality.   Hepatobiliary: Scattered small low-attenuation liver lesions which  are too small to completely characterize but likely simple cysts.  Normal appearance of the gallbladder. No biliary ductal dilation.   Pancreas:  Unremarkable. No pancreatic ductal dilatation or  surrounding inflammatory changes.   Spleen: Surgically absent spleen.   Adrenals/Urinary Tract: Left adrenal gland nodule measuring 1.4 cm,  unchanged when compared with 2016 prior exam and likely a benign  lipid poor adenoma given stability, no further follow-up imaging is  recommended. Normal appearance of the right kidney. Mild left  pelviectasis. Mild left hydroureter and periureteral fat stranding  with a 7 mm stone is seen at the left ureter vesicular junction.  Streak artifact from bilateral hip arthroplasties somewhat limits  evaluation of the bladder.   Stomach/Bowel: Stomach is within normal limits. Appendix appears  normal. No evidence of bowel wall thickening, distention, or  inflammatory changes.   Vascular/Lymphatic: No significant vascular findings are present. No  enlarged abdominal or pelvic lymph nodes.   Reproductive: Prostate is not well visualized due to streak  artifact.   Other: Small fat containing ventral abdominal wall hernia. No  abdominopelvic ascites.   Musculoskeletal: Bilateral total hip arthroplasties. Moderate  degenerative changes of the lumbar spine. No aggressive appearing  osseous lesions.   IMPRESSION:  Mild left pelviectasis and left hydroureter with a 7 mm stone at the  left ureterovesical junction.   These results will be called to the ordering clinician or  representative by the Radiologist Assistant, and communication  documented in the PACS or [employer].    Electronically Signed  By: Yetta Glassman M.D.  On: 07/15/2022 08:54   PROCEDURES:         KUB - [id number]  A single view of the abdomen is obtained. Inability to identify a ureteral stone. No evidence of stone overlying the renal shadows.      Patient confirmed No Neulasta OnPro Device.           Urinalysis w/Scope Dipstick Dipstick Cont'd Micro  Color: Yellow Bilirubin: Neg mg/dL WBC/hpf: 6 - 10/hpf   Appearance: Clear Ketones: Neg mg/dL RBC/hpf: 3 - 10/hpf  Specific Gravity: 1.025 Blood: 2+ ery/uL Bacteria: Few (10-25/hpf)  pH: 6.0 Protein: Trace mg/dL Cystals: NS (Not Seen)  Glucose: Neg mg/dL Urobilinogen: 0.2 mg/dL Casts: NS (Not Seen)    Nitrites: Neg Trichomonas: Not Present    Leukocyte Esterase: Neg leu/uL Mucous: Present      Epithelial Cells: 0 - 5/hpf      Yeast: NS (Not Seen)      Sperm: Not Present    ASSESSMENT:      ICD-10 Details  1 GU:   Ureteral calculus - N20.1   2   Gross hematuria - R31.0    PLAN:           Orders Labs CULTURE, URINE, Urine Cytology  X-Rays: KUB. No Oral Contrast          Schedule         Document Letter(s):  Created for Patient: Clinical Summary         Notes:   1. Left ureteral stone:  -I reviewed CT A/P 07/15/2022 with 7 mm left ureterovesical junction stone. Discussed options including ureteroscopy with laser lithotripsy and shockwave lithotripsy.  -KUB 07/16/2022 with inability to  clearly define stone.  I recommend ureteroscopy with laser lithotripsy. Also recommend bilateral retrograde pyelogram and cystoscopy to complete hematuria evaluation. Discussed risk and benefits. Surgery letter sent. Return precautions discussed. We will send urine for culture.   We discussed the options for management of kidney stones, including observation, ESWL, ureteroscopy with laser lithotripsy, and PCNL. The risks and benefits of each option were discussed.  For observation I described the risks which include but are not limited to silent renal damage, life-threatening infection, need for emergent surgery, failure to pass stone, and pain.   ESWL: risks and benefits of ESWL were outlined including infection, bleeding, pain, steinstrasse, kidney injury, need for ancillary treatments, and global anesthesia risks including but not limited to CVA, MI, DVT, PE, pneumonia, and death.   Ureteroscopy: risks and benefits of ureteroscopy were outlined,  including infection, bleeding, pain, temporary ureteral stent and associated stent bother, ureteral injury, ureteral stricture, need for ancillary treatments, and global anesthesia risks including but not limited to CVA, MI, DVT, PE, pneumonia, and death.   PCNL: risks and benefits of PCNL were outlined including infection, bleeding, blood transfusion, pain, pneumothorax, bowel injury, persistent urine leak, positioning injury, inability to clear stone burden, renal laceration, arterial venous fistula or malformation, need for ancillary treatments, and global anesthesia risks including but not limited to CVA, MI, DVT, PE, pneumonia, and death.   We discussed dietary methods for stone prevention including the following: increased water intake to 2-3 liters per day, add lemon or lemon concentrate to water to increase citrate which is beneficial for stone prevention, limiting dietary sodium to less than 2000 mg per day, limiting animal protein to less than 2 servings (16 ounces/day), and limiting foods high in oxalate content (spinach, beans, chocolate, etc.).   #2Johney [place] hematuria: We will complete hematuria evaluation with cystoscopy and bilateral retrograde pyelogram in the operating room while treating his ureteral stone. We will send urine for cytology.   CC: Unk Pinto, MD    Urology Preoperative H&P   Chief Complaint: Left ureteral stone  History of Present Illness: Erik Quinn is a 56 y.o. male with a left ureteral stone here for cystoscopy, left ureteroscopy with laser lithotripsy and basket traction of stone.  He denies fevers, chills, dysuria.  Preoperative urine culture demonstrated no growth.    Past Medical History:  Diagnosis Date   GERD (gastroesophageal reflux disease)    History of kidney stones    Hypertension    Pt denies   Pre-diabetes     Past Surgical History:  Procedure Laterality Date   Arm surgery Left    KNEE ARTHROSCOPY WITH LATERAL MENISECTOMY Left  09/01/2017   Procedure: ARTHROSCOPY LEFT KNEE, DEBRIDEMENT OF CHONDROMALACIA;  Surgeon: Frederik Pear, MD;  Location: [place] [facility];  Service: Orthopedics;  Laterality: Left;   SPLENECTOMY  1994   TOTAL HIP ARTHROPLASTY      Allergies: No Known Allergies  Family History  Problem Relation Age of Onset   Diabetes Mother    Sleep apnea Mother     Social History:  reports that he has been smoking cigarettes. He has a 20.00 pack-year smoking history. He has never used smokeless tobacco. He reports current alcohol use of about 8.0 standard drinks of alcohol per week. He reports that he does not use drugs.  ROS: A complete review of systems was performed.  All systems are negative except for pertinent findings as noted.  Physical Exam:  Vital signs in last 24 hours:  Constitutional:  Alert and oriented, No acute distress Cardiovascular: Regular rate and rhythm Respiratory: Normal respiratory effort, Lungs clear bilaterally GI: Abdomen is soft, nontender, nondistended, no abdominal masses GU: No CVA tenderness Lymphatic: No lymphadenopathy Neurologic: Grossly intact, no focal deficits Psychiatric: Normal mood and affect  Laboratory Data:  No results for input(s): "WBC", "HGB", "HCT", "PLT" in the last 72 hours.  No results for input(s): "NA", "K", "CL", "GLUCOSE", "BUN", "CALCIUM", "CREATININE" in the last 72 hours.  Invalid input(s): "CO3"   No results found for this or any previous visit (from the past 24 hour(s)). Recent Results (from the past 240 hour(s))  Urine Culture     Status: None   Collection Time: 07/13/22 10:28 AM   Specimen: Urine  Result Value Ref Range Status   MICRO NUMBER: [id number]  Final   SPECIMEN QUALITY: Adequate  Final   Sample Source URINE  Final   STATUS: FINAL  Final   Result: No Growth  Final  MICROSCOPIC MESSAGE     Status: None   Collection Time: 07/13/22 10:28 AM  Result Value Ref Range Status   Note   Final    Comment: This  urine was analyzed for the presence of WBC,  RBC, bacteria, casts, and other formed elements.  Only those elements seen were reported. . .     Renal Function: Recent Labs    07/13/22 1028  CREATININE 0.96   Estimated Creatinine Clearance: 117 mL/min (by C-G formula based on SCr of 0.96 mg/dL).  Radiologic Imaging: No results found.  I independently reviewed the above imaging studies.  Assessment and Plan Erik Quinn is a 56 y.o. male with a left ureteral stone here for cystoscopy, left ureteroscopy with laser lithotripsy and basket traction of stone.    -The risks, benefits and alternatives of cystoscopy with left ureteroscopy with laser lithotripsy and basket extraction of stone, JJ stent placement was discussed with the patient.  Risks include, but are not limited to: bleeding, urinary tract infection, ureteral injury, ureteral stricture disease, chronic pain, urinary symptoms, bladder injury, stent migration, the need for nephrostomy tube placement, MI, CVA, DVT, PE and the inherent risks with general anesthesia.  The patient voices understanding and wishes to proceed.   Matt R. Jasiah Elsen MD 07/20/2022, 1:29 AM  [facility] Pager: [phone]): [phone]

## 2022-07-20 NOTE — Discharge Instructions (Signed)
[facility] [phone] Post Ureteroscopy With or Without Stent Instructions  Definitions:  Ureter: The duct that transports urine from the kidney to the bladder. Stent:   A plastic hollow tube that is placed into the ureter, from the kidney to the bladder to prevent the ureter from swelling shut.  GENERAL INSTRUCTIONS:  Despite the fact that no skin incisions were used, the area around the ureter and bladder is raw and irritated. The stent is a foreign body which will further irritate the bladder wall. This irritation is manifested by increased frequency of urination, both day and night, and by an increase in the urge to urinate. In some, the urge to urinate is present almost always. Sometimes the urge is strong enough that you may not be able to stop yourself from urinating. The only real cure is to remove the stent and then give time for the bladder wall to heal which can't be done until the danger of the ureter swelling shut has passed, which varies.  You may see some blood in your urine while the stent is in place and a few days afterwards. Do not be alarmed, even if the urine was clear for a while. Get off your feet and drink lots of fluids until clearing occurs. If you start to pass clots or don't improve, call us.  DIET: You may return to your normal diet immediately. Because of the raw surface of your bladder, alcohol, spicy foods, acid type foods and drinks with caffeine may cause irritation or frequency and should be used in moderation. To keep your urine flowing freely and to avoid constipation, drink plenty of fluids during the day ( 8-10 glasses ). Tip: Avoid cranberry juice because it is very acidic.  ACTIVITY: Your physical activity doesn't need to be restricted. However, if you are very active, you may see some blood in your urine. We suggest that you reduce your activity under these circumstances until the bleeding has stopped.  BOWELS: It is important to  keep your bowels regular during the postoperative period. Straining with bowel movements can cause bleeding. A bowel movement every other day is reasonable. Use a mild laxative if needed, such as Milk of Magnesia 2-3 tablespoons, or 2 Dulcolax tablets. Call if you continue to have problems. If you have been taking narcotics for pain, before, during or after your surgery, you may be constipated. Take a laxative if necessary.   MEDICATION: You should resume your pre-surgery medications unless told not to. In addition you will often be given an antibiotic to prevent infection. These should be taken as prescribed until the bottles are finished unless you are having an unusual reaction to one of the drugs.  PROBLEMS YOU SHOULD REPORT TO [place]: Fevers over 100.5 Fahrenheit. Heavy bleeding, or clots ( See above notes about blood in urine ). Inability to urinate. Drug reactions ( hives, rash, nausea, vomiting, diarrhea ). Severe burning or pain with urination that is not improving.  FOLLOW-UP: You will need a follow-up appointment to monitor your progress. Call for this appointment at the number listed above. Usually the first appointment will be about three to fourteen days after your surgery.  You have a left ureteral stent in place draining your left kidney.  This is attached to a string.  You may remove the stent by pulling on the attached string on Thursday morning.

## 2022-07-20 NOTE — Op Note (Signed)
Operative Note  Preoperative diagnosis:  1.  Left ureteral stone  Postoperative diagnosis: 1.  Impacted left ureterovesical junction stone  Procedure(s): 1.  Cystoscopy 2. Left ureteroscopy with laser lithotripsy and basket extraction of stones 3. Left retrograde pyelogram 4. Left ureteral stent placement 5. Fluoroscopy with intraoperative interpretation  Surgeon: Rexene Alberts, MD  Assistants:  None  Anesthesia:  General  Complications:  None  EBL:  Minimal  Specimens: 1. Stones for stone analysis (to be done at [facility])  Drains/Catheters: 1.  Left 6Fr x 28cm ureteral stent WITH a tether string  Intraoperative findings:   Cystoscopy demonstrated mildly large prostate.  He had an impacted left ureterovesical junction stone greater than 1 cm.  Left ureteroscopy demonstrated approximately 1.5 x 1.5 cm left UVJ stone that was successfully fragmented and basket extracted.  This was impacted with some surrounding inflammation in the distal left ureter.  There is severe left hydronephrosis proximal to the stone however no other stone fragments above the stone. Left retrograde pyelogram demonstrated severe left hydronephrosis proximal to the stone. Successful left ureteral stent placement attached to a tether string.  Indication:  Erik Quinn is a 56 y.o. male with with a history of impacted left ureterovesical junction stone here for definitive treatment.  Description of procedure: After informed consent was obtained from the patient, the patient was identified and taken to the operating room and placed in the supine position.  General anesthesia was administered as well as perioperative IV antibiotics.  At the beginning of the case, a time-out was performed to properly identify the patient, the surgery to be performed, and the surgical site.  Sequential compression devices were applied to the lower extremities at the beginning of the case for DVT prophylaxis.  The patient  was then placed in the dorsal lithotomy supine position, prepped and draped in sterile fashion.  Preliminary scout fluoroscopy revealed that there was a 1.5 cm calcification area at the left distal ureter, which corresponds to the  stone found on the preoperative CT scan. We then passed the 21-French rigid cystoscope through the urethra and into the bladder under vision without any difficulty, noting a normal urethra without strictures and a mildly obstructing prostate.  A systematic evaluation of the bladder revealed no evidence of any suspicious bladder lesions.  Ureteral orifices were in normal position.  Immediately obvious was an approximately 1.5 cm stone that was impacted within the left ureterovesical junction.  I attempted to pass a 0.038 sensor wire around the stone however it would not initially go.  I then introduced a 200 m holmium laser fiber via the cystoscope and proceeded to start fragmenting the stone.  After fragment the stone, was able to then place a safety wire up to the level left renal pelvis.  A 5 French opening catheter was then advanced beyond this wire and a retrograde pyelogram demonstrated severe left hydronephrosis.  I then replaced the safety wire and secured this to the drape.  I then introduced a semirigid ureteroscope and using the 200 m holmium laser fiber, I fragmented the very large impacted left ureterovesical junction stone in its entirety.  The stone measure approximate 1.5 x 1.5 cm.  Of note, the distal left ureter was friable and inflamed given this impaction.  After removing all stone fragments, I was able to navigate the scope into the left ureter that was widely patent and hydronephrotic.  There were no further stone fragments identified.  The left ureteral orifice was widely patent.  Once the ureteroscope was removed, the Glidewire was backloaded through the rigid cystoscope, which was then advanced down the urethra and into the bladder. We then used the  Glidewire under direct vision through the rigid cystoscope and under fluoroscopic guidance and passed up a 6-French, 28 cm double-pigtail ureteral stent up ureter, making sure that the proximal and distal ends coiled within the kidney and bladder respectively.  Note that we left a long tether string attached to the distal end of the ureteral stent and it exited the urethral meatus and was secured to the penile shaft with a tegaderm adhesive.  The cystoscope was then advanced back into the bladder under vision.  We were able to see the distal stent coiling nicely within the bladder.  The bladder was then emptied with irrigation solution.  The cystoscope was then removed.    The patient tolerated the procedure well and there was no complication. Patient was awoken from anesthesia and taken to the recovery room in stable condition. I was present and scrubbed for the entirety of the case.  Plan:  Patient will be discharged home.  He will remove his stent by pulling on attached string in 3 days.  He will follow-up in the office in 1 month with renal ultrasound.  [facility]  Pager: [phone]

## 2022-07-20 NOTE — Anesthesia Procedure Notes (Signed)
Procedure Name: LMA Insertion Date/Time: 07/20/2022 3:32 PM  Performed by: Montel Clock, CRNAPre-anesthesia Checklist: Patient identified, Emergency Drugs available, Suction available, Patient being monitored and Timeout performed Patient Re-evaluated:Patient Re-evaluated prior to induction Oxygen Delivery Method: Circle system utilized Preoxygenation: Pre-oxygenation with 100% oxygen Induction Type: IV induction Ventilation: Mask ventilation without difficulty LMA: LMA with gastric port inserted LMA Size: 5.0 Number of attempts: 1 Dental Injury: Teeth and Oropharynx as per pre-operative assessment

## 2022-07-20 NOTE — Anesthesia Postprocedure Evaluation (Signed)
Anesthesia Post Note  Patient: Erik Quinn  Procedure(s) Performed: CYSTOSCOPY/URETEROSCOPY/HOLMIUM LASER/STENT PLACEMENT (Left)     Patient location during evaluation: PACU Anesthesia Type: General Level of consciousness: awake and alert Pain management: pain level controlled Vital Signs Assessment: post-procedure vital signs reviewed and stable Respiratory status: spontaneous breathing, nonlabored ventilation, respiratory function stable and patient connected to nasal cannula oxygen Cardiovascular status: blood pressure returned to baseline and stable Postop Assessment: no apparent nausea or vomiting Anesthetic complications: no   No notable events documented.  Last Vitals:  Vitals:   07/20/22 1330 07/20/22 1649  BP: (!) 147/98 (!) 157/106  Pulse: 64 (!) 59  Resp: 18 15  Temp: 36.8 C 36.5 C  SpO2: 98% 100%    Last Pain:  Vitals:   07/20/22 1649  TempSrc:   PainSc: 0-No pain                 Janisse Ghan S

## 2022-07-21 ENCOUNTER — Encounter (HOSPITAL_COMMUNITY): Payer: Self-pay | Admitting: Urology

## 2022-09-04 NOTE — Progress Notes (Unsigned)
Assessment and Plan:  There are no diagnoses linked to this encounter.    Further disposition pending results of labs. Discussed med's effects and SE's.   Over 30 minutes of exam, counseling, chart review, and critical decision making was performed.   Future Appointments  Date Time Provider [place]  09/07/2022 10:30 AM Alycia Rossetti, NP [facility] None  12/10/2022 10:00 AM Unk Pinto, MD [facility] None    ------------------------------------------------------------------------------------------------------------------   HPI There were no vitals taken for this visit. 56 y.o.male presents for  Past Medical History:  Diagnosis Date   GERD (gastroesophageal reflux disease)    History of kidney stones    Hypertension    Pt denies   Pre-diabetes      No Known Allergies  Current Outpatient Medications on File Prior to Visit  Medication Sig   docusate sodium (COLACE) 100 MG capsule Take 1 capsule (100 mg total) by mouth daily as needed for up to 30 doses.   No current facility-administered medications on file prior to visit.    ROS: all negative except above.   Physical Exam:  There were no vitals taken for this visit.  General Appearance: Well nourished, in no apparent distress. Eyes: PERRLA, EOMs, conjunctiva no swelling or erythema Sinuses: No Frontal/maxillary tenderness ENT/Mouth: Ext aud canals clear, TMs without erythema, bulging. No erythema, swelling, or exudate on post pharynx.  Tonsils not swollen or erythematous. Hearing normal.  Neck: Supple, thyroid normal.  Respiratory: Respiratory effort normal, BS equal bilaterally without rales, rhonchi, wheezing or stridor.  Cardio: RRR with no MRGs. Brisk peripheral pulses without edema.  Abdomen: Soft, + BS.  Non tender, no guarding, rebound, hernias, masses. Lymphatics: Non tender without lymphadenopathy.  Musculoskeletal: Full ROM, 5/5 strength, normal gait.  Skin: Warm, dry without rashes,  lesions, ecchymosis.  Neuro: Cranial nerves intact. Normal muscle tone, no cerebellar symptoms. Sensation intact.  Psych: Awake and oriented X 3, normal affect, Insight and Judgment appropriate.     Alycia Rossetti, NP 11:20 AM [facility]

## 2022-09-07 ENCOUNTER — Encounter: Payer: Self-pay | Admitting: Nurse Practitioner

## 2022-09-07 ENCOUNTER — Other Ambulatory Visit: Payer: Self-pay | Admitting: Nurse Practitioner

## 2022-09-07 ENCOUNTER — Ambulatory Visit (HOSPITAL_COMMUNITY)
Admission: RE | Admit: 2022-09-07 | Discharge: 2022-09-07 | Disposition: A | Discharge status: Home / Self Care | Payer: BC Managed Care – PPO | Source: Ambulatory Visit | Attending: Nurse Practitioner | Admitting: Nurse Practitioner

## 2022-09-07 ENCOUNTER — Ambulatory Visit (INDEPENDENT_AMBULATORY_CARE_PROVIDER_SITE_OTHER): Payer: BC Managed Care – PPO | Admitting: Nurse Practitioner

## 2022-09-07 VITALS — BP 100/80 | HR 104 | Temp 97.7°F | Ht 74.0 in | Wt 246.6 lb

## 2022-09-07 DIAGNOSIS — J029 Acute pharyngitis, unspecified: Secondary | ICD-10-CM | POA: Diagnosis not present

## 2022-09-07 DIAGNOSIS — R9389 Abnormal findings on diagnostic imaging of other specified body structures: Secondary | ICD-10-CM

## 2022-09-07 DIAGNOSIS — R1011 Right upper quadrant pain: Secondary | ICD-10-CM | POA: Diagnosis not present

## 2022-09-07 DIAGNOSIS — R519 Headache, unspecified: Secondary | ICD-10-CM

## 2022-09-07 DIAGNOSIS — K828 Other specified diseases of gallbladder: Secondary | ICD-10-CM

## 2022-09-07 DIAGNOSIS — R6889 Other general symptoms and signs: Secondary | ICD-10-CM

## 2022-09-07 DIAGNOSIS — Z1152 Encounter for screening for COVID-19: Secondary | ICD-10-CM | POA: Diagnosis not present

## 2022-09-07 LAB — POCT RAPID STREP A (OFFICE): Rapid Strep A Screen: NEGATIVE

## 2022-09-07 LAB — POC INFLUENZA A&B (BINAX/QUICKVUE)
Influenza A, POC: NEGATIVE
Influenza B, POC: NEGATIVE

## 2022-09-07 LAB — POC COVID19 BINAXNOW: SARS Coronavirus 2 Ag: NEGATIVE

## 2022-09-07 MED ORDER — CIPROFLOXACIN HCL 500 MG PO TABS: 500.0000 mg | ORAL_TABLET | Freq: Two times a day (BID) | ORAL | 0 refills | Status: DC

## 2022-09-07 NOTE — Addendum Note (Signed)
Addended by: Lorenda Peck E on: 09/07/2022 03:55 PM   Modules accepted: Orders

## 2022-09-07 NOTE — Patient Instructions (Addendum)

## 2022-09-07 NOTE — Progress Notes (Signed)
Patient aware of results and scheduled for MRI Liver 09/08/22.

## 2022-09-08 ENCOUNTER — Emergency Department (HOSPITAL_BASED_OUTPATIENT_CLINIC_OR_DEPARTMENT_OTHER): Payer: BC Managed Care – PPO

## 2022-09-08 ENCOUNTER — Observation Stay (HOSPITAL_BASED_OUTPATIENT_CLINIC_OR_DEPARTMENT_OTHER)
Admission: EM | Admit: 2022-09-08 | Discharge: 2022-09-10 | Disposition: A | Discharge status: Home / Self Care | Payer: BC Managed Care – PPO | Attending: Internal Medicine | Admitting: Internal Medicine

## 2022-09-08 ENCOUNTER — Other Ambulatory Visit: Payer: Self-pay

## 2022-09-08 ENCOUNTER — Other Ambulatory Visit (HOSPITAL_BASED_OUTPATIENT_CLINIC_OR_DEPARTMENT_OTHER): Payer: Self-pay

## 2022-09-08 ENCOUNTER — Encounter (HOSPITAL_BASED_OUTPATIENT_CLINIC_OR_DEPARTMENT_OTHER): Payer: Self-pay

## 2022-09-08 ENCOUNTER — Ambulatory Visit (HOSPITAL_COMMUNITY): Admission: RE | Admit: 2022-09-08 | Payer: BC Managed Care – PPO | Source: Ambulatory Visit

## 2022-09-08 DIAGNOSIS — R519 Headache, unspecified: Secondary | ICD-10-CM

## 2022-09-08 DIAGNOSIS — Z96649 Presence of unspecified artificial hip joint: Secondary | ICD-10-CM | POA: Diagnosis not present

## 2022-09-08 DIAGNOSIS — I10 Essential (primary) hypertension: Secondary | ICD-10-CM | POA: Insufficient documentation

## 2022-09-08 DIAGNOSIS — N10 Acute tubulo-interstitial nephritis: Principal | ICD-10-CM | POA: Insufficient documentation

## 2022-09-08 DIAGNOSIS — K5900 Constipation, unspecified: Secondary | ICD-10-CM

## 2022-09-08 DIAGNOSIS — N179 Acute kidney failure, unspecified: Secondary | ICD-10-CM | POA: Diagnosis not present

## 2022-09-08 DIAGNOSIS — N12 Tubulo-interstitial nephritis, not specified as acute or chronic: Secondary | ICD-10-CM | POA: Diagnosis present

## 2022-09-08 DIAGNOSIS — D7589 Other specified diseases of blood and blood-forming organs: Secondary | ICD-10-CM | POA: Diagnosis not present

## 2022-09-08 DIAGNOSIS — Z23 Encounter for immunization: Secondary | ICD-10-CM | POA: Diagnosis not present

## 2022-09-08 DIAGNOSIS — F17210 Nicotine dependence, cigarettes, uncomplicated: Secondary | ICD-10-CM | POA: Diagnosis not present

## 2022-09-08 DIAGNOSIS — R109 Unspecified abdominal pain: Secondary | ICD-10-CM

## 2022-09-08 DIAGNOSIS — Z79899 Other long term (current) drug therapy: Secondary | ICD-10-CM | POA: Insufficient documentation

## 2022-09-08 DIAGNOSIS — R1011 Right upper quadrant pain: Secondary | ICD-10-CM | POA: Diagnosis present

## 2022-09-08 LAB — CBC WITH DIFFERENTIAL/PLATELET
Abs Immature Granulocytes: 0.17 10*3/uL — ABNORMAL HIGH (ref 0.00–0.07)
Absolute Monocytes: 2328 cells/uL — ABNORMAL HIGH (ref 200–950)
Basophils Absolute: 87 cells/uL (ref 0–200)
Basophils Absolute: 0.1 10*3/uL (ref 0.0–0.1)
Basophils Relative: 0.3 %
Basophils Relative: 0 %
Eosinophils Absolute: 0 cells/uL — ABNORMAL LOW (ref 15–500)
Eosinophils Absolute: 0 10*3/uL (ref 0.0–0.5)
Eosinophils Relative: 0 %
Eosinophils Relative: 0 %
HCT: 49.7 % (ref 38.5–50.0)
HCT: 49.7 % (ref 39.0–52.0)
Hemoglobin: 18.1 g/dL — ABNORMAL HIGH (ref 13.2–17.1)
Hemoglobin: 17.5 g/dL — ABNORMAL HIGH (ref 13.0–17.0)
Immature Granulocytes: 1 %
Lymphocytes Relative: 7 %
Lymphs Abs: 1659 cells/uL (ref 850–3900)
Lymphs Abs: 1.6 10*3/uL (ref 0.7–4.0)
MCH: 35.8 pg — ABNORMAL HIGH (ref 27.0–33.0)
MCH: 35.5 pg — ABNORMAL HIGH (ref 26.0–34.0)
MCHC: 36.4 g/dL — ABNORMAL HIGH (ref 32.0–36.0)
MCHC: 35.2 g/dL (ref 30.0–36.0)
MCV: 98.4 fL (ref 80.0–100.0)
MCV: 100.8 fL — ABNORMAL HIGH (ref 80.0–100.0)
MPV: 10.9 fL (ref 7.5–12.5)
Monocytes Absolute: 2.5 10*3/uL — ABNORMAL HIGH (ref 0.1–1.0)
Monocytes Relative: 8 %
Monocytes Relative: 10 %
Neutro Abs: 25026 cells/uL — ABNORMAL HIGH (ref 1500–7800)
Neutro Abs: 19.7 10*3/uL — ABNORMAL HIGH (ref 1.7–7.7)
Neutrophils Relative %: 86 %
Neutrophils Relative %: 82 %
Platelets: 297 10*3/uL (ref 140–400)
Platelets: 302 10*3/uL (ref 150–400)
RBC: 5.05 10*6/uL (ref 4.20–5.80)
RBC: 4.93 MIL/uL (ref 4.22–5.81)
RDW: 11.8 % (ref 11.0–15.0)
RDW: 13.1 % (ref 11.5–15.5)
Total Lymphocyte: 5.7 %
WBC: 29.1 10*3/uL — ABNORMAL HIGH (ref 3.8–10.8)
WBC: 24 10*3/uL — ABNORMAL HIGH (ref 4.0–10.5)
nRBC: 0 % (ref 0.0–0.2)

## 2022-09-08 LAB — COMPREHENSIVE METABOLIC PANEL
ALT: 39 U/L (ref 0–44)
AST: 31 U/L (ref 15–41)
Albumin: 3.6 g/dL (ref 3.5–5.0)
Alkaline Phosphatase: 81 U/L (ref 38–126)
Anion gap: 11 (ref 5–15)
BUN: 30 mg/dL — ABNORMAL HIGH (ref 6–20)
CO2: 26 mmol/L (ref 22–32)
Calcium: 9.8 mg/dL (ref 8.9–10.3)
Chloride: 97 mmol/L — ABNORMAL LOW (ref 98–111)
Creatinine, Ser: 1.25 mg/dL — ABNORMAL HIGH (ref 0.61–1.24)
GFR, Estimated: >60 mL/min (ref >60)
Glucose, Bld: 122 mg/dL — ABNORMAL HIGH (ref 70–99)
Potassium: 4.3 mmol/L (ref 3.5–5.1)
Sodium: 134 mmol/L — ABNORMAL LOW (ref 135–145)
Total Bilirubin: 0.7 mg/dL (ref 0.3–1.2)
Total Protein: 7.7 g/dL (ref 6.5–8.1)

## 2022-09-08 LAB — URINALYSIS, ROUTINE W REFLEX MICROSCOPIC
Bilirubin Urine: NEGATIVE
Glucose, UA: NEGATIVE mg/dL
Ketones, ur: NEGATIVE mg/dL
Nitrite: NEGATIVE
Protein, ur: 30 mg/dL — AB
Specific Gravity, Urine: 1.015 (ref 1.005–1.030)
WBC, UA: >50 WBC/hpf — ABNORMAL HIGH (ref 0–5)
pH: 5.5 (ref 5.0–8.0)

## 2022-09-08 LAB — COMPLETE METABOLIC PANEL WITH GFR
AG Ratio: 0.9 (calc) — ABNORMAL LOW (ref 1.0–2.5)
ALT: 31 U/L (ref 9–46)
AST: 27 U/L (ref 10–35)
Albumin: 3.3 g/dL — ABNORMAL LOW (ref 3.6–5.1)
Alkaline phosphatase (APISO): 99 U/L (ref 35–144)
BUN: 21 mg/dL (ref 7–25)
CO2: 27 mmol/L (ref 20–32)
Calcium: 9.2 mg/dL (ref 8.6–10.3)
Chloride: 95 mmol/L — ABNORMAL LOW (ref 98–110)
Creat: 1.24 mg/dL (ref 0.70–1.30)
Globulin: 3.5 g/dL (calc) (ref 1.9–3.7)
Glucose, Bld: 124 mg/dL — ABNORMAL HIGH (ref 65–99)
Potassium: 4.5 mmol/L (ref 3.5–5.3)
Sodium: 133 mmol/L — ABNORMAL LOW (ref 135–146)
Total Bilirubin: 1 mg/dL (ref 0.2–1.2)
Total Protein: 6.8 g/dL (ref 6.1–8.1)
eGFR: 68 mL/min/{1.73_m2} (ref >=60)

## 2022-09-08 LAB — LIPASE, BLOOD: Lipase: 27 U/L (ref 11–51)

## 2022-09-08 LAB — AMYLASE: Amylase: 52 U/L (ref 21–101)

## 2022-09-08 LAB — LIPASE: Lipase: 33 U/L (ref 7–60)

## 2022-09-08 MED ORDER — SODIUM CHLORIDE 0.9 % IV BOLUS
1000.0000 mL | Freq: Once | INTRAVENOUS | Status: AC
  Administered 2022-09-08: 1000 mL via INTRAVENOUS

## 2022-09-08 MED ORDER — LACTATED RINGERS IV SOLN: INTRAVENOUS | Status: DC

## 2022-09-08 MED ORDER — POLYETHYLENE GLYCOL 3350 17 G PO PACK
17.0000 g | PACK | Freq: Every day | ORAL | Status: DC
  Administered 2022-09-08: 17 g via ORAL
  Filled 2022-09-08 (×2): qty 1

## 2022-09-08 MED ORDER — ONDANSETRON HCL 4 MG/2ML IJ SOLN: 4.0000 mg | Freq: Four times a day (QID) | INTRAMUSCULAR | Status: DC | PRN

## 2022-09-08 MED ORDER — PNEUMOCOCCAL 20-VAL CONJ VACC 0.5 ML IM SUSY
0.5000 mL | PREFILLED_SYRINGE | INTRAMUSCULAR | Status: AC
  Administered 2022-09-09: 0.5 mL via INTRAMUSCULAR
  Filled 2022-09-08: qty 0.5

## 2022-09-08 MED ORDER — INFLUENZA VAC SPLIT QUAD 0.5 ML IM SUSY
0.5000 mL | PREFILLED_SYRINGE | INTRAMUSCULAR | Status: AC
  Administered 2022-09-09: 0.5 mL via INTRAMUSCULAR
  Filled 2022-09-08: qty 0.5

## 2022-09-08 MED ORDER — ACETAMINOPHEN 325 MG PO TABS
650.0000 mg | ORAL_TABLET | Freq: Four times a day (QID) | ORAL | Status: DC | PRN
  Administered 2022-09-08 – 2022-09-09 (×2): 650 mg via ORAL
  Filled 2022-09-08 (×2): qty 2

## 2022-09-08 MED ORDER — ENOXAPARIN SODIUM 40 MG/0.4ML IJ SOSY
40.0000 mg | PREFILLED_SYRINGE | Freq: Every day | INTRAMUSCULAR | Status: DC
  Administered 2022-09-09 – 2022-09-10 (×2): 40 mg via SUBCUTANEOUS
  Filled 2022-09-08 (×2): qty 0.4

## 2022-09-08 MED ORDER — METOCLOPRAMIDE HCL 5 MG/ML IJ SOLN: 10.0000 mg | Freq: Four times a day (QID) | INTRAMUSCULAR | Status: DC | PRN

## 2022-09-08 MED ORDER — ACETAMINOPHEN 650 MG RE SUPP: 650.0000 mg | Freq: Four times a day (QID) | RECTAL | Status: DC | PRN

## 2022-09-08 MED ORDER — SODIUM CHLORIDE 0.9 % IV SOLN
2.0000 g | Freq: Once | INTRAVENOUS | Status: AC
  Administered 2022-09-08: 2 g via INTRAVENOUS
  Filled 2022-09-08: qty 20

## 2022-09-08 MED ORDER — OXYCODONE HCL 5 MG PO TABS: 5.0000 mg | ORAL_TABLET | ORAL | Status: DC | PRN

## 2022-09-08 MED ORDER — IOHEXOL 300 MG/ML  SOLN
100.0000 mL | Freq: Once | INTRAMUSCULAR | Status: AC | PRN
  Administered 2022-09-08: 85 mL via INTRAVENOUS

## 2022-09-08 MED ORDER — ONDANSETRON HCL 4 MG/2ML IJ SOLN
4.0000 mg | Freq: Once | INTRAMUSCULAR | Status: AC
  Administered 2022-09-08: 4 mg via INTRAVENOUS
  Filled 2022-09-08: qty 2

## 2022-09-08 MED ORDER — SORBITOL 70 % SOLN: 30.0000 mL | Freq: Every day | Status: DC | PRN

## 2022-09-08 MED ORDER — DIPHENHYDRAMINE HCL 50 MG/ML IJ SOLN
12.5000 mg | Freq: Four times a day (QID) | INTRAMUSCULAR | Status: DC | PRN
  Administered 2022-09-08: 12.5 mg via INTRAVENOUS
  Filled 2022-09-08: qty 1

## 2022-09-08 MED ORDER — SENNOSIDES-DOCUSATE SODIUM 8.6-50 MG PO TABS
1.0000 | ORAL_TABLET | Freq: Two times a day (BID) | ORAL | Status: DC
  Administered 2022-09-08 – 2022-09-10 (×4): 1 via ORAL
  Filled 2022-09-08 (×4): qty 1

## 2022-09-08 MED ORDER — MAGNESIUM CITRATE PO SOLN: 1.0000 | Freq: Once | ORAL | Status: DC | PRN

## 2022-09-08 MED ORDER — METRONIDAZOLE 500 MG/100ML IV SOLN
500.0000 mg | Freq: Two times a day (BID) | INTRAVENOUS | Status: DC
  Administered 2022-09-08 – 2022-09-10 (×4): 500 mg via INTRAVENOUS
  Filled 2022-09-08 (×4): qty 100

## 2022-09-08 NOTE — Assessment & Plan Note (Signed)
-   check folate/B12

## 2022-09-08 NOTE — ED Provider Notes (Signed)
[facility] EMERGENCY DEPT Provider Note   CSN: [id number] Arrival date & time: 09/08/22  0947     History  Chief Complaint  Patient presents with   Abdominal Pain    Erik Quinn is a 56 y.o. male.  Patient here for evaluation of abdominal pain.  He has been having some right-sided abdominal pain for the last several days.  Had a gallbladder ultrasound that was unremarkable.  He had elevated white count was sent for further evaluation.  He denies any fevers or chills.  He had decreased appetite.  Primary care doctor worried maybe for appendicitis.  Denies any nausea or vomiting.  Denies any fevers or chills.  Has had some body aches, sinus congestion.  They started him on ciprofloxacin yesterday.  The history is provided by the patient.       Home Medications Prior to Admission medications   Medication Sig Start Date End Date Taking? Authorizing Provider  diphenhydrAMINE (BENADRYL) 25 MG tablet Take 25 mg by mouth every 6 (six) hours as needed.   Yes [provider]  diphenhydrAMINE HCl, Sleep, (ZZZQUIL PO) Take by mouth.   Yes [provider]  guaiFENesin (MUCINEX) 600 MG 12 hr tablet Take 600 mg by mouth 2 (two) times daily.   Yes [provider]  ciprofloxacin (CIPRO) 500 MG tablet Take 1 tablet (500 mg total) by mouth 2 (two) times daily for 10 days. 09/07/22 09/17/22  Alycia Rossetti, NP      Allergies    Patient has no known allergies.    Review of Systems   Review of Systems  Physical Exam Updated Vital Signs BP (!) 131/99   Pulse 74   Temp 98.4 F (36.9 C)   Resp 17   SpO2 97%  Physical Exam Vitals and nursing note reviewed.  Constitutional:      General: He is not in acute distress.    Appearance: He is well-developed. He is not ill-appearing.  HENT:     Head: Normocephalic and atraumatic.  Eyes:     Conjunctiva/sclera: Conjunctivae normal.  Cardiovascular:     Rate and Rhythm: Normal rate and regular rhythm.      Heart sounds: Normal heart sounds. No murmur heard. Pulmonary:     Effort: Pulmonary effort is normal. No respiratory distress.     Breath sounds: Normal breath sounds.  Abdominal:     General: Abdomen is flat.     Palpations: Abdomen is soft.     Tenderness: There is abdominal tenderness in the right upper quadrant and right lower quadrant.  Musculoskeletal:        General: No swelling.     Cervical back: Neck supple.  Skin:    General: Skin is warm and dry.     Capillary Refill: Capillary refill takes less than 2 seconds.  Neurological:     Mental Status: He is alert.  Psychiatric:        Mood and Affect: Mood normal.     ED Results / Procedures / Treatments   Labs (all labs ordered are listed, but only abnormal results are displayed) Labs Reviewed  CBC WITH DIFFERENTIAL/PLATELET - Abnormal; Notable for the following components:      Result Value   WBC 24.0 (*)    Hemoglobin 17.5 (*)    MCV 100.8 (*)    MCH 35.5 (*)    Neutro Abs 19.7 (*)    Monocytes Absolute 2.5 (*)    Abs Immature Granulocytes 0.17 (*)  All other components within normal limits  COMPREHENSIVE METABOLIC PANEL - Abnormal; Notable for the following components:   Sodium 134 (*)    Chloride 97 (*)    Glucose, Bld 122 (*)    BUN 30 (*)    Creatinine, Ser 1.25 (*)    All other components within normal limits  URINALYSIS, ROUTINE W REFLEX MICROSCOPIC - Abnormal; Notable for the following components:   APPearance HAZY (*)    Hgb urine dipstick MODERATE (*)    Protein, ur 30 (*)    Leukocytes,Ua LARGE (*)    WBC, UA >50 (*)    Bacteria, UA MANY (*)    All other components within normal limits  URINE CULTURE  LIPASE, BLOOD    EKG None  Radiology CT ABDOMEN PELVIS W CONTRAST  Result Date: 09/08/2022 CLINICAL DATA:  Abdominal pain EXAM: CT ABDOMEN AND PELVIS WITH CONTRAST TECHNIQUE: Multidetector CT imaging of the abdomen and pelvis was performed using the standard protocol following bolus  administration of intravenous contrast. RADIATION DOSE REDUCTION: This exam was performed according to the departmental dose-optimization program which includes automated exposure control, adjustment of the mA and/or kV according to patient size and/or use of iterative reconstruction technique. CONTRAST:  29m OMNIPAQUE IOHEXOL 300 MG/ML  SOLN COMPARISON:  07/15/2022 FINDINGS: Lower chest: Visualized lower lung fields are clear. Small pericardial effusion is present. Hepatobiliary: There is fatty infiltration in liver. There are few small smooth marginated low-density lesions measuring up to 14 mm in size, possibly cysts or hemangiomas. There is no dilation of bile ducts. Gallbladder is not distended. Pancreas: No focal abnormalities are seen. Spleen: Spleen is not seen. Surgical clips are seen in left upper quadrant of abdomen. Adrenals/Urinary Tract: There is 1.7 cm nodule in left adrenal with no significant interval change in comparison with an earlier CT done on 02/17/2015 suggesting possible adenoma. There is no hydronephrosis. There is subtle inhomogeneous cortical enhancement in the kidneys, more so in the lateral aspect of upper pole of right kidney. There is minimal perinephric stranding. There are no renal stones. Previously described 7 mm distal left ureteral calculus is not evident. Evaluation of urinary bladder is limited by beam hardening artifacts. As far as seen, there is wall thickening in the urinary bladder. There is incomplete distention of the bladder. Stomach/Bowel: Stomach is unremarkable. Small bowel loops are not dilated. Appendix is not dilated. There is no significant wall thickening in colon. Few diverticula are seen in colon without signs of diverticulitis. Vascular/Lymphatic: There are scattered arterial calcifications. There is tortuosity of abdominal aorta. There are few slightly enlarged lymph nodes adjacent to porta hepatis each measuring less than 1 cm in short axis. In the image  18 of series 2, there is 13 mm smooth marginated structure with fluid density measurement in retrocrural region to the left of aorta. There are other subcentimeter nodes in retroperitoneum. Reproductive: There is inhomogeneous attenuation in prostate with small calcifications. Left hydrocele is seen. Other: There is no ascites or pneumoperitoneum. There is paraumbilical hernia containing fat. Musculoskeletal: There is arthroplasty in both hips. Degenerative changes are noted in lumbar spine with posterior bony spurs causing spinal stenosis and encroachment of neural foramina from L1-2 L4 levels. Encroachment of neural foramina is noted from L1-S1 levels. IMPRESSION: There is diffuse wall thickening in the urinary bladder. There is subtle inhomogeneous cortical enhancement in the kidneys. Findings suggest possible cystitis and pyelonephritis. There is no hydronephrosis. There is no evidence of intestinal obstruction or pneumoperitoneum. Appendix is not  dilated. Few diverticula are seen in colon without signs of focal diverticulitis. Small pericardial effusion. Fatty liver. Possible cysts or hemangiomas are noted in liver. There is 1.7 cm nodule in left adrenal with no significant change suggesting possible adenoma. There is 1.3 cm smooth marginated fluid density lesion in retrocrural region, possibly a cyst or cystic degeneration of retrocrural lymph node. There are few subcentimeter nodes in retroperitoneum and mesentery, possibly suggesting benign reactive hyperplasia. Left hydrocele is seen. Severe lumbar spondylosis. Other findings as described in the body of the report. Electronically Signed   By: Elmer Picker M.D.   On: 09/08/2022 12:36   DG Chest Portable 1 View  Result Date: 09/08/2022 CLINICAL DATA:  Cough; abnormal labs EXAM: PORTABLE CHEST 1 VIEW COMPARISON:  12/02/2016 FINDINGS: Cardiac and mediastinal contours are within normal limits. No focal pulmonary opacity. Azygous fissure. No pleural  effusion or pneumothorax. No acute osseous abnormality. IMPRESSION: No acute cardiopulmonary process. Electronically Signed   By: Merilyn Baba M.D.   On: 09/08/2022 11:49   US Abdomen Limited RUQ (LIVER/GB)  Result Date: 09/07/2022 CLINICAL DATA:  RIGHT upper quadrant pain. EXAM: ULTRASOUND ABDOMEN LIMITED RIGHT UPPER QUADRANT COMPARISON:  CT 07/15/2022 FINDINGS: Gallbladder: No gallstones or wall thickening visualized. No sonographic Murphy sign noted by sonographer. Common bile duct: Diameter: Normal at 4 mm Liver: Liver is increased in echogenicity uniformly. Along the gallbladder fossa there is a 4.7 x 1.9 x 5.3 cm well demarcated region of hypoechogenicity. No blood flow within this lesion. Portal vein is patent on color Doppler imaging with normal direction of blood flow towards the liver. Other: No free fluid IMPRESSION: 1. Normal gallbladder and biliary tree 2. Well-circumscribed anechoic lesion adjacent to the gallbladder fossa. No corresponding lesion on comparison CT. Favor focal fatty sparing along the gallbladder fossa however due to relatively large size and incomplete determination, recommend MRI hepatic protocol with contrast. These results will be called to the ordering clinician or representative by the Radiologist Assistant, and communication documented in the PACS or [employer]. Electronically Signed   By: Suzy Bouchard M.D.   On: 09/07/2022 14:04    Procedures Procedures    Medications Ordered in ED Medications  cefTRIAXone (ROCEPHIN) 2 g in sodium chloride 0.9 % 100 mL IVPB (has no administration in time range)  ondansetron (ZOFRAN) injection 4 mg (has no administration in time range)  sodium chloride 0.9 % bolus 1,000 mL (0 mLs Intravenous Stopped 09/08/22 1258)  iohexol (OMNIPAQUE) 300 MG/ML solution 100 mL (85 mLs Intravenous Contrast Given 09/08/22 1205)    ED Course/ Medical Decision Making/ A&P                           Medical Decision Making Amount and/or  Complexity of Data Reviewed Labs: ordered. Radiology: ordered.  Risk Prescription drug management. Decision regarding hospitalization.   Erik Quinn is here with abdominal pain.  Normal vitals.  No fever.  History of hypertension, kidney stones.  Differential diagnosis is possibly kidney stone versus pancreatitis versus appendicitis versus bowel obstruction.  We will check CBC, CMP, lipase, CT abdomen pelvis.  He had an ultrasound yesterday that was fairly unremarkable.  Seems less likely to be cholecystitis.  Urinalysis appears to be consistent with infection.  White count of 24.  Elevated to 1.25.  CT scan per radiology report consistent with likely acute pyelonephritis.  No kidney stone.  Other incidental findings as report.  Given that patient clinically appears dehydrated,  has had poor p.o. intake and now with progressive urinary infection believe observation stay for IV antibiotics and IV fluids is warranted.  Patient amenable to this.  Will admit.  This chart was dictated using voice recognition software.  Despite best efforts to proofread,  errors can occur which can change the documentation meaning.         Final Clinical Impression(s) / ED Diagnoses Final diagnoses:  Acute pyelonephritis    Rx / DC Orders ED Discharge Orders     None         Lennice Sites, DO 09/08/22 1302

## 2022-09-08 NOTE — Hospital Course (Signed)
Erik Quinn is a 56 yo male with PMH nephrolithiasis, HTN, GERD, prediabetes who presented to [facility] with ongoing abdominal pain and abnormal outpatient labs. He presented to primary care on 09/07/2022 with reported RUQ abdominal pain, headache and sore throat.  Rapid strep test was negative as well as POC flu and COVID testing. He underwent lab work-up and RUQ ultrasound.  Ultrasound showed a well-circumscribed anechoic lesion adjacent to the GB fossa and was ultimately recommended for MRI hepatic protocol with contrast.  WBC had returned markedly elevated, 29.1 as well. He was prescribed cipro on 9/18 as well.   On arrival to [facility] he underwent further workup.  CT A/P showed a known 1.7 cm left adrenal nodule (no significant interval change in comparison with prior CT, suggesting possible adenoma), subtle cortical enhancement in the lateral aspect of upper pole of right kidney with minimal perinephric stranding, thickening of urinary bladder.  Gallbladder was not noted to be distended nor any noted abnormalities.  Liver was noted to have a few small smooth marginated low-density lesions measuring up to 14 mm in size (possibly cysts or hemangiomas per radiology report).  Urinalysis obtained notable for negative nitrite, large LE, greater than 50 WBC, many bacteria.  Urine culture sent. Rocephin started for presumed urinary associated infection. Repeat WBC on work-up at [facility] still elevated, 24 with 1% bands.  He recently underwent cystoscopy with left ureteroscopy with laser lithotripsy and left ureteral stent placement due to impacted left UVJ stone on 07/20/2022.  Patient was to follow-up again with urology approximately 1 month later for repeat renal ultrasound. Of note, patient has removed ureteral stent via tether as not seen on CT A/P performed on admission.  Prior 7 mm distal left ureteral calculus also no longer seen on repeat imaging on admission.  Obtaining further collateral information from  patient, he denied significant abdominal pain however on exam he was still tender in the right upper quadrant.  He has some back pain which he thinks is soreness from being in bed recently from feeling ill.  He does endorse feeling sweaty occasionally recently but has not checked his temperature at home.  He denies any diarrhea and states it has been several days without a bowel movement in fact.  He denies any change in urine color or odor nor any pain, frequency, urgency, discomfort.

## 2022-09-08 NOTE — Assessment & Plan Note (Signed)
-   Patient endorses several days with no bowel movement albeit low/poor appetite - start laxative regimen

## 2022-09-08 NOTE — ED Triage Notes (Signed)
Pt presents POV from home.   PCP called him this morning and instructed him to come to [facility] for further evaluation of abnormal labs and to be admitted.   Pt reports he was at his PCP yesterday, insure if he had appendicitis or something wrong with his gall bladder.

## 2022-09-08 NOTE — Progress Notes (Signed)
Plan of Care Note for accepted transfer   Patient: Erik Quinn MRN: [id number]   DOA: 09/08/2022  Facility requesting transfer: [facility] Requesting Provider: Dr. Ronnald Nian Reason for transfer: pyelonephritis Facility course: 56 yo M w/ PMHx of HTN. Presenting with abdominal pain. W/u revealed elevated white count, dirty UA and imaging suspicious for UTI/pyelonephritis. He was started on fluids, rocephin.   Plan of care: The patient is accepted for admission to Telemetry unit, at [facility]. While holding at [facility], medical decision making responsibilities remain with the [facility]. Upon arrival to [facility] will assume care. Thank you.  Author: Jonnie Finner, DO 09/08/2022  Check [email or web address] for on-call coverage.  Nursing staff, Please call [place] number on Amion as soon as patient's arrival, so appropriate admitting provider can evaluate the pt.

## 2022-09-08 NOTE — Assessment & Plan Note (Signed)
-   some suspicion for pyelo given subtle R CVA tenderness on exam, thickened bladder, and UA consistent with infection; however symptoms are mild and still doesn't explain RUQ pain, thus not entirely sure the primary process at play still  - for now, continue rocephin and monitor clinical status - follow up urine culture - obtain blood cultures

## 2022-09-08 NOTE — Assessment & Plan Note (Signed)
-   does have RUQ pain with almost positive Murphy's sign but no obvious findings on CT; ultrasound from 9/18 states "Along the gallbladder fossa there is a 4.7 x 1.9 x 5.3 cm well demarcated region of hypoechogenicity. No blood flow within this lesion." - add on flagyl for now - if further evolution of pain, may need surgery input vs more workup regardless - trend CBC

## 2022-09-08 NOTE — Assessment & Plan Note (Signed)
-   baseline creatinine ~ 0.9 - patient presents with increase in creat >0.3 mg/dL above baseline, creat increase >1.5x baseline presumed to have occurred within past 7 days PTA -Suspected prerenal/hypovolemia from poor intake lately - Continue fluids - Trend BMP

## 2022-09-08 NOTE — Assessment & Plan Note (Signed)
-   no chronic history; started when patient began feeling ill - minimal relief from tylenol - may also use benadryl/reglan combo to see if helps

## 2022-09-08 NOTE — H&P (Signed)
History and Physical    Erik Quinn  CZY:[id number]  DOB: [birth year]  DOA: 09/08/2022  PCP: Unk Pinto, MD Patient coming from: Home  Chief Complaint: RUQ abd pain, back pain, headache  HPI:  Erik Quinn is a 56 yo male with PMH nephrolithiasis, HTN, GERD, prediabetes who presented to [facility] with ongoing abdominal pain and abnormal outpatient labs. He presented to primary care on 09/07/2022 with reported RUQ abdominal pain, headache and sore throat.  Rapid strep test was negative as well as POC flu and COVID testing. He underwent lab work-up and RUQ ultrasound.  Ultrasound showed a well-circumscribed anechoic lesion adjacent to the GB fossa and was ultimately recommended for MRI hepatic protocol with contrast.  WBC had returned markedly elevated, 29.1 as well. He was prescribed cipro on 9/18 as well.   On arrival to [facility] he underwent further workup.  CT A/P showed a known 1.7 cm left adrenal nodule (no significant interval change in comparison with prior CT, suggesting possible adenoma), subtle cortical enhancement in the lateral aspect of upper pole of right kidney with minimal perinephric stranding, thickening of urinary bladder.  Gallbladder was not noted to be distended nor any noted abnormalities.  Liver was noted to have a few small smooth marginated low-density lesions measuring up to 14 mm in size (possibly cysts or hemangiomas per radiology report).  Urinalysis obtained notable for negative nitrite, large LE, greater than 50 WBC, many bacteria.  Urine culture sent. Rocephin started for presumed urinary associated infection. Repeat WBC on work-up at [facility] still elevated, 24 with 1% bands.  He recently underwent cystoscopy with left ureteroscopy with laser lithotripsy and left ureteral stent placement due to impacted left UVJ stone on 07/20/2022.  Patient was to follow-up again with urology approximately 1 month later for repeat renal ultrasound. Of note, patient has removed ureteral  stent via tether as not seen on CT A/P performed on admission.  Prior 7 mm distal left ureteral calculus also no longer seen on repeat imaging on admission.  Obtaining further collateral information from patient, he denied significant abdominal pain however on exam he was still tender in the right upper quadrant.  He has some back pain which he thinks is soreness from being in bed recently from feeling ill.  He does endorse feeling sweaty occasionally recently but has not checked his temperature at home.  He denies any diarrhea and states it has been several days without a bowel movement in fact.  He denies any change in urine color or odor nor any pain, frequency, urgency, discomfort.  I have personally briefly reviewed patient's old medical records in [facility] and discussed patient with the ER provider when appropriate/indicated.  Assessment and Plan: * Pyelonephritis - some suspicion for pyelo given subtle R CVA tenderness on exam, thickened bladder, and UA consistent with infection; however symptoms are mild and still doesn't explain RUQ pain, thus not entirely sure the primary process at play still  - for now, continue rocephin and monitor clinical status - follow up urine culture - obtain blood cultures  AKI (acute kidney injury) ([place]) - baseline creatinine ~ 0.9 - patient presents with increase in creat >0.3 mg/dL above baseline, creat increase >1.5x baseline presumed to have occurred within past 7 days PTA -Suspected prerenal/hypovolemia from poor intake lately - Continue fluids - Trend BMP   Abdominal pain - does have RUQ pain with almost positive Murphy's sign but no obvious findings on CT; ultrasound from 9/18 states "Along the gallbladder fossa  there is a 4.7 x 1.9 x 5.3 cm well demarcated region of hypoechogenicity. No blood flow within this lesion." - add on flagyl for now - if further evolution of pain, may need surgery input vs more workup regardless - trend  CBC  Headache - no chronic history; started when patient began feeling ill - minimal relief from tylenol - may also use benadryl/reglan combo to see if helps  Constipation - Patient endorses several days with no bowel movement albeit low/poor appetite - start laxative regimen  Macrocytosis - check folate/B12    Code Status:     Code Status: Full Code  DVT Prophylaxis:   enoxaparin (LOVENOX) injection 40 mg Start: 09/09/22 1000   Anticipated disposition is to: Home  History: Past Medical History:  Diagnosis Date   GERD (gastroesophageal reflux disease)    History of kidney stones    Hypertension    Pt denies   Pre-diabetes     Past Surgical History:  Procedure Laterality Date   Arm surgery Left    CYSTOSCOPY/URETEROSCOPY/HOLMIUM LASER/STENT PLACEMENT Left 07/20/2022   Procedure: CYSTOSCOPY/URETEROSCOPY/HOLMIUM LASER/STENT PLACEMENT;  Surgeon: Janith Lima, MD;  Location: [facility];  Service: Urology;  Laterality: Left;  ONLY NEEDS 45 MIN   KNEE ARTHROSCOPY WITH LATERAL MENISECTOMY Left 09/01/2017   Procedure: ARTHROSCOPY LEFT KNEE, DEBRIDEMENT OF CHONDROMALACIA;  Surgeon: Frederik Pear, MD;  Location: [facility];  Service: Orthopedics;  Laterality: Left;   SPLENECTOMY  1994   TOTAL HIP ARTHROPLASTY       reports that he has been smoking cigarettes. He has a 20.00 pack-year smoking history. He has never used smokeless tobacco. He reports current alcohol use of about 8.0 standard drinks of alcohol per week. He reports that he does not use drugs.  No Known Allergies  Family History  Problem Relation Age of Onset   Diabetes Mother    Sleep apnea Mother    Home Medications: Prior to Admission medications   Medication Sig Start Date End Date Taking? Authorizing Provider  diphenhydrAMINE (BENADRYL) 25 MG tablet Take 25 mg by mouth every 6 (six) hours as needed.   Yes [provider]  diphenhydrAMINE HCl, Sleep, (ZZZQUIL PO) Take by mouth.   Yes  [provider]  guaiFENesin (MUCINEX) 600 MG 12 hr tablet Take 600 mg by mouth 2 (two) times daily.   Yes [provider]  ciprofloxacin (CIPRO) 500 MG tablet Take 1 tablet (500 mg total) by mouth 2 (two) times daily for 10 days. 09/07/22 09/17/22  Alycia Rossetti, NP    Review of Systems:  Review of Systems  Constitutional:  Positive for malaise/fatigue. Negative for chills and fever.  HENT: Negative.    Eyes: Negative.   Respiratory: Negative.    Cardiovascular: Negative.   Gastrointestinal:  Positive for abdominal pain, constipation and nausea. Negative for vomiting.  Genitourinary:  Negative for dysuria, flank pain, frequency and urgency.  Musculoskeletal:  Positive for back pain.  Skin: Negative.   Neurological: Negative.   Endo/Heme/Allergies: Negative.   Psychiatric/Behavioral: Negative.      Physical Exam:  Vitals:   09/08/22 1154 09/08/22 1400 09/08/22 1430 09/08/22 1534  BP: (!) 131/99 130/88 129/89 (!) 139/95  Pulse: 74 64 67 72  Resp: '17 16 17 18  '$ Temp:  98.2 F (36.8 C)  98.3 F (36.8 C)  TempSrc:    Oral  SpO2: 97% 98% 97% 98%  Weight:    109.6 kg  Height:    '6\' 2"'$  (1.88  m)   Physical Exam Constitutional:      General: He is not in acute distress.    Appearance: He is well-developed. He is not ill-appearing.  HENT:     Head: Normocephalic and atraumatic.     Mouth/Throat:     Mouth: Mucous membranes are moist.  Eyes:     Extraocular Movements: Extraocular movements intact.  Cardiovascular:     Rate and Rhythm: Normal rate and regular rhythm.  Pulmonary:     Effort: Pulmonary effort is normal.     Breath sounds: Normal breath sounds.  Abdominal:     General: Bowel sounds are normal. There is no distension.     Palpations: Abdomen is soft.     Tenderness: There is abdominal tenderness in the right upper quadrant. There is right CVA tenderness. There is no guarding or rebound. Positive signs include Murphy's sign.  Musculoskeletal:         General: Normal range of motion.     Cervical back: Normal range of motion and neck supple.  Skin:    General: Skin is warm and dry.  Neurological:     General: No focal deficit present.     Mental Status: He is alert.  Psychiatric:        Mood and Affect: Mood normal.        Behavior: Behavior normal.      Labs on Admission:  I have personally reviewed following labs and imaging studies Results for orders placed or performed during the hospital encounter of 09/08/22 (from the past 24 hour(s))  CBC with Differential     Status: Abnormal   Collection Time: 09/08/22 11:14 AM  Result Value Ref Range   WBC 24.0 (H) 4.0 - 10.5 K/uL   RBC 4.93 4.22 - 5.81 MIL/uL   Hemoglobin 17.5 (H) 13.0 - 17.0 g/dL   HCT 49.7 39.0 - 52.0 %   MCV 100.8 (H) 80.0 - 100.0 fL   MCH 35.5 (H) 26.0 - 34.0 pg   MCHC 35.2 30.0 - 36.0 g/dL   RDW 13.1 11.5 - 15.5 %   Platelets 302 150 - 400 K/uL   nRBC 0.0 0.0 - 0.2 %   Neutrophils Relative % 82 %   Neutro Abs 19.7 (H) 1.7 - 7.7 K/uL   Lymphocytes Relative 7 %   Lymphs Abs 1.6 0.7 - 4.0 K/uL   Monocytes Relative 10 %   Monocytes Absolute 2.5 (H) 0.1 - 1.0 K/uL   Eosinophils Relative 0 %   Eosinophils Absolute 0.0 0.0 - 0.5 K/uL   Basophils Relative 0 %   Basophils Absolute 0.1 0.0 - 0.1 K/uL   Immature Granulocytes 1 %   Abs Immature Granulocytes 0.17 (H) 0.00 - 0.07 K/uL  Comprehensive metabolic panel     Status: Abnormal   Collection Time: 09/08/22 11:14 AM  Result Value Ref Range   Sodium 134 (L) 135 - 145 mmol/L   Potassium 4.3 3.5 - 5.1 mmol/L   Chloride 97 (L) 98 - 111 mmol/L   CO2 26 22 - 32 mmol/L   Glucose, Bld 122 (H) 70 - 99 mg/dL   BUN 30 (H) 6 - 20 mg/dL   Creatinine, Ser 1.25 (H) 0.61 - 1.24 mg/dL   Calcium 9.8 8.9 - 10.3 mg/dL   Total Protein 7.7 6.5 - 8.1 g/dL   Albumin 3.6 3.5 - 5.0 g/dL   AST 31 15 - 41 U/L   ALT 39 0 - 44 U/L   Alkaline Phosphatase  81 38 - 126 U/L   Total Bilirubin 0.7 0.3 - 1.2 mg/dL   GFR,  Estimated >60 >60 mL/min   Anion gap 11 5 - 15  Lipase, blood     Status: None   Collection Time: 09/08/22 11:14 AM  Result Value Ref Range   Lipase 27 11 - 51 U/L  Urinalysis, Routine w reflex microscopic     Status: Abnormal   Collection Time: 09/08/22 12:00 PM  Result Value Ref Range   Color, Urine YELLOW YELLOW   APPearance HAZY (A) CLEAR   Specific Gravity, Urine 1.015 1.005 - 1.030   pH 5.5 5.0 - 8.0   Glucose, UA NEGATIVE NEGATIVE mg/dL   Hgb urine dipstick MODERATE (A) NEGATIVE   Bilirubin Urine NEGATIVE NEGATIVE   Ketones, ur NEGATIVE NEGATIVE mg/dL   Protein, ur 30 (A) NEGATIVE mg/dL   Nitrite NEGATIVE NEGATIVE   Leukocytes,Ua LARGE (A) NEGATIVE   RBC / HPF 11-20 0 - 5 RBC/hpf   WBC, UA >50 (H) 0 - 5 WBC/hpf   Bacteria, UA MANY (A) NONE SEEN   Squamous Epithelial / LPF 0-5 0 - 5   WBC Clumps PRESENT    Mucus PRESENT      Radiological Exams on Admission: CT ABDOMEN PELVIS W CONTRAST  Result Date: 09/08/2022 CLINICAL DATA:  Abdominal pain EXAM: CT ABDOMEN AND PELVIS WITH CONTRAST TECHNIQUE: Multidetector CT imaging of the abdomen and pelvis was performed using the standard protocol following bolus administration of intravenous contrast. RADIATION DOSE REDUCTION: This exam was performed according to the departmental dose-optimization program which includes automated exposure control, adjustment of the mA and/or kV according to patient size and/or use of iterative reconstruction technique. CONTRAST:  73m OMNIPAQUE IOHEXOL 300 MG/ML  SOLN COMPARISON:  07/15/2022 FINDINGS: Lower chest: Visualized lower lung fields are clear. Small pericardial effusion is present. Hepatobiliary: There is fatty infiltration in liver. There are few small smooth marginated low-density lesions measuring up to 14 mm in size, possibly cysts or hemangiomas. There is no dilation of bile ducts. Gallbladder is not distended. Pancreas: No focal abnormalities are seen. Spleen: Spleen is not seen. Surgical  clips are seen in left upper quadrant of abdomen. Adrenals/Urinary Tract: There is 1.7 cm nodule in left adrenal with no significant interval change in comparison with an earlier CT done on 02/17/2015 suggesting possible adenoma. There is no hydronephrosis. There is subtle inhomogeneous cortical enhancement in the kidneys, more so in the lateral aspect of upper pole of right kidney. There is minimal perinephric stranding. There are no renal stones. Previously described 7 mm distal left ureteral calculus is not evident. Evaluation of urinary bladder is limited by beam hardening artifacts. As far as seen, there is wall thickening in the urinary bladder. There is incomplete distention of the bladder. Stomach/Bowel: Stomach is unremarkable. Small bowel loops are not dilated. Appendix is not dilated. There is no significant wall thickening in colon. Few diverticula are seen in colon without signs of diverticulitis. Vascular/Lymphatic: There are scattered arterial calcifications. There is tortuosity of abdominal aorta. There are few slightly enlarged lymph nodes adjacent to porta hepatis each measuring less than 1 cm in short axis. In the image 18 of series 2, there is 13 mm smooth marginated structure with fluid density measurement in retrocrural region to the left of aorta. There are other subcentimeter nodes in retroperitoneum. Reproductive: There is inhomogeneous attenuation in prostate with small calcifications. Left hydrocele is seen. Other: There is no ascites or pneumoperitoneum. There is paraumbilical hernia  containing fat. Musculoskeletal: There is arthroplasty in both hips. Degenerative changes are noted in lumbar spine with posterior bony spurs causing spinal stenosis and encroachment of neural foramina from L1-2 L4 levels. Encroachment of neural foramina is noted from L1-S1 levels. IMPRESSION: There is diffuse wall thickening in the urinary bladder. There is subtle inhomogeneous cortical enhancement in the  kidneys. Findings suggest possible cystitis and pyelonephritis. There is no hydronephrosis. There is no evidence of intestinal obstruction or pneumoperitoneum. Appendix is not dilated. Few diverticula are seen in colon without signs of focal diverticulitis. Small pericardial effusion. Fatty liver. Possible cysts or hemangiomas are noted in liver. There is 1.7 cm nodule in left adrenal with no significant change suggesting possible adenoma. There is 1.3 cm smooth marginated fluid density lesion in retrocrural region, possibly a cyst or cystic degeneration of retrocrural lymph node. There are few subcentimeter nodes in retroperitoneum and mesentery, possibly suggesting benign reactive hyperplasia. Left hydrocele is seen. Severe lumbar spondylosis. Other findings as described in the body of the report. Electronically Signed   By: Elmer Picker M.D.   On: 09/08/2022 12:36   DG Chest Portable 1 View  Result Date: 09/08/2022 CLINICAL DATA:  Cough; abnormal labs EXAM: PORTABLE CHEST 1 VIEW COMPARISON:  12/02/2016 FINDINGS: Cardiac and mediastinal contours are within normal limits. No focal pulmonary opacity. Azygous fissure. No pleural effusion or pneumothorax. No acute osseous abnormality. IMPRESSION: No acute cardiopulmonary process. Electronically Signed   By: Merilyn Baba M.D.   On: 09/08/2022 11:49   US Abdomen Limited RUQ (LIVER/GB)  Result Date: 09/07/2022 CLINICAL DATA:  RIGHT upper quadrant pain. EXAM: ULTRASOUND ABDOMEN LIMITED RIGHT UPPER QUADRANT COMPARISON:  CT 07/15/2022 FINDINGS: Gallbladder: No gallstones or wall thickening visualized. No sonographic Murphy sign noted by sonographer. Common bile duct: Diameter: Normal at 4 mm Liver: Liver is increased in echogenicity uniformly. Along the gallbladder fossa there is a 4.7 x 1.9 x 5.3 cm well demarcated region of hypoechogenicity. No blood flow within this lesion. Portal vein is patent on color Doppler imaging with normal direction of blood flow  towards the liver. Other: No free fluid IMPRESSION: 1. Normal gallbladder and biliary tree 2. Well-circumscribed anechoic lesion adjacent to the gallbladder fossa. No corresponding lesion on comparison CT. Favor focal fatty sparing along the gallbladder fossa however due to relatively large size and incomplete determination, recommend MRI hepatic protocol with contrast. These results will be called to the ordering clinician or representative by the Radiologist Assistant, and communication documented in the PACS or [employer]. Electronically Signed   By: Suzy Bouchard M.D.   On: 09/07/2022 14:04    CT ABDOMEN PELVIS W CONTRAST  Final Result    DG Chest Portable 1 View  Final Result      Consults called:  none    Dwyane Dee, MD [facility] 09/08/2022, 4:33 PM

## 2022-09-09 DIAGNOSIS — N12 Tubulo-interstitial nephritis, not specified as acute or chronic: Secondary | ICD-10-CM | POA: Diagnosis not present

## 2022-09-09 LAB — CBC WITH DIFFERENTIAL/PLATELET
Abs Immature Granulocytes: 0.13 10*3/uL — ABNORMAL HIGH (ref 0.00–0.07)
Basophils Absolute: 0.1 10*3/uL (ref 0.0–0.1)
Basophils Relative: 1 %
Eosinophils Absolute: 0.1 10*3/uL (ref 0.0–0.5)
Eosinophils Relative: 1 %
HCT: 44.3 % (ref 39.0–52.0)
Hemoglobin: 14.9 g/dL (ref 13.0–17.0)
Immature Granulocytes: 1 %
Lymphocytes Relative: 12 %
Lymphs Abs: 1.9 10*3/uL (ref 0.7–4.0)
MCH: 35 pg — ABNORMAL HIGH (ref 26.0–34.0)
MCHC: 33.6 g/dL (ref 30.0–36.0)
MCV: 104 fL — ABNORMAL HIGH (ref 80.0–100.0)
Monocytes Absolute: 1.7 10*3/uL — ABNORMAL HIGH (ref 0.1–1.0)
Monocytes Relative: 10 %
Neutro Abs: 12.7 10*3/uL — ABNORMAL HIGH (ref 1.7–7.7)
Neutrophils Relative %: 75 %
Platelets: 340 10*3/uL (ref 150–400)
RBC: 4.26 MIL/uL (ref 4.22–5.81)
RDW: 13.2 % (ref 11.5–15.5)
WBC: 16.7 10*3/uL — ABNORMAL HIGH (ref 4.0–10.5)
nRBC: 0 % (ref 0.0–0.2)

## 2022-09-09 LAB — COMPREHENSIVE METABOLIC PANEL
ALT: 39 U/L (ref 0–44)
AST: 32 U/L (ref 15–41)
Albumin: 2.4 g/dL — ABNORMAL LOW (ref 3.5–5.0)
Alkaline Phosphatase: 66 U/L (ref 38–126)
Anion gap: 8 (ref 5–15)
BUN: 26 mg/dL — ABNORMAL HIGH (ref 6–20)
CO2: 27 mmol/L (ref 22–32)
Calcium: 8.4 mg/dL — ABNORMAL LOW (ref 8.9–10.3)
Chloride: 105 mmol/L (ref 98–111)
Creatinine, Ser: 1.03 mg/dL (ref 0.61–1.24)
GFR, Estimated: >60 mL/min (ref >60)
Glucose, Bld: 122 mg/dL — ABNORMAL HIGH (ref 70–99)
Potassium: 4.5 mmol/L (ref 3.5–5.1)
Sodium: 140 mmol/L (ref 135–145)
Total Bilirubin: 0.5 mg/dL (ref 0.3–1.2)
Total Protein: 6 g/dL — ABNORMAL LOW (ref 6.5–8.1)

## 2022-09-09 LAB — VITAMIN B12: Vitamin B-12: 287 pg/mL (ref 180–914)

## 2022-09-09 LAB — PROCALCITONIN: Procalcitonin: 0.18 ng/mL

## 2022-09-09 LAB — FOLATE: Folate: 12 ng/mL (ref >5.9)

## 2022-09-09 LAB — HIV ANTIBODY (ROUTINE TESTING W REFLEX): HIV Screen 4th Generation wRfx: NONREACTIVE

## 2022-09-09 LAB — MAGNESIUM: Magnesium: 2 mg/dL (ref 1.7–2.4)

## 2022-09-09 MED ORDER — DIPHENHYDRAMINE HCL 25 MG PO CAPS
25.0000 mg | ORAL_CAPSULE | Freq: Once | ORAL | Status: AC
  Administered 2022-09-09: 25 mg via ORAL
  Filled 2022-09-09: qty 1

## 2022-09-09 MED ORDER — SODIUM CHLORIDE 0.9 % IV SOLN
2.0000 g | INTRAVENOUS | Status: DC
  Administered 2022-09-09 – 2022-09-10 (×2): 2 g via INTRAVENOUS
  Filled 2022-09-09 (×2): qty 20

## 2022-09-09 MED ORDER — VITAMIN B-12 1000 MCG PO TABS
1000.0000 ug | ORAL_TABLET | Freq: Every day | ORAL | Status: DC
  Administered 2022-09-09 – 2022-09-10 (×2): 1000 ug via ORAL
  Filled 2022-09-09 (×2): qty 1

## 2022-09-09 MED ORDER — MELATONIN 3 MG PO TABS
3.0000 mg | ORAL_TABLET | Freq: Once | ORAL | Status: AC
  Administered 2022-09-09: 3 mg via ORAL
  Filled 2022-09-09: qty 1

## 2022-09-09 NOTE — Progress Notes (Signed)
Mobility Specialist - Progress Note   09/09/22 1017  Mobility  HOB Elevated/Bed Position Self regulated  Activity Ambulated independently in hallway  Range of Motion/Exercises Active  Level of Assistance Independent  Assistive Device None  Distance Ambulated (ft) 750 ft  Activity Response Tolerated well  $Mobility charge 1 Mobility   Pt was found in bed and agreeable to mobilize. Had no complaints and was left in bed with all necessities in reach at EOS.  Ferd Hibbs Mobility Specialist

## 2022-09-09 NOTE — Plan of Care (Signed)

## 2022-09-09 NOTE — Progress Notes (Signed)
  Transition of Care [facility]) Screening Note   Patient Details  Name: Erik Quinn Date of Birth: 1966-04-24   Transition of Care [facility]) CM/SW Contact:    Dessa Phi, RN Phone Number: 09/09/2022, 2:42 PM    Transition of Care Department [facility]) has reviewed patient and no TOC needs have been identified at this time. We will continue to monitor patient advancement through interdisciplinary progression rounds. If new patient transition needs arise, please place a TOC consult.

## 2022-09-09 NOTE — Progress Notes (Signed)
PROGRESS NOTE  Erik Quinn  RXV:[id number] DOB: [birth year] DOA: 09/08/2022 PCP: Unk Pinto, MD   Brief Narrative: Patient is a 56 year old male with history of nephrolithiasis, hypertension, GERD, prediabetes who presented with abdomen pain, abnormal outpatient labs.  He initially presented to his primary care physician on 9/18 with complaint of right upper quadrant pain, headache, sore throat.  Ultrasound showed well circumcised anechoic lesion adjacent to the gallbladder and was recommended for MRI hepatic protocol with contrast.  He was prescribed ciprofloxacin on 9/18.  He had leukocytosis. Further work-up after admission here showed 1.7 cm left adrenal nodule possibly adenoma, possible features of cystitis/pyelonephritis on the right, normal gallbladder and kidney.  UA also showed large leukocytes, bacteria.  Urine culture, blood culture sent.  Started on Rocephin .  Assessment & Plan:  Principal Problem:   Pyelonephritis Active Problems:   Abdominal pain   AKI (acute kidney injury) ([place])   Constipation   Headache   Macrocytosis  Suspected pyelonephritis/cystitis: Imaging showed features of cystitis/right pyonephritis.  UA consistent with infection.  Continue with antibiotic, follow blood cultures, urine culture.  Leukocytosis improving.  Right upper quadrant pain/abnormal MRI finding: Unclear etiology.  Outpatient  Ultrasound showed well circumcised anechoic lesion adjacent to the gallbladder and was recommended for MRI hepatic protocol with contrast.  CT abdomen/pelvis done on this admission showed normal gallbladder anatomy.  We will consult general surgery or order HIDA scan if continues to have right abdominal pain.  Liver enzymes stable.  He denies any right upper quadrant pain today but has mild right upper quadrant tenderness.  Patient does not want to do MRI here as the CT scan does not show any abnormal findings. We recommend to do MRI as an outpatient as per her  PCP  AKI: Resolved with IV fluids  Headache: Continue supportive care.  On Tylenol  Constipation: Continue bowel regimen  History of nephrolithiasis: recently underwent cystoscopy with left ureteroscopy with laser lithotripsy and left ureteral stent placement due to impacted left UVJ stone on 07/20/2022.  Patient was to follow-up again with urology approximately 1 month later for repeat renal ultrasound. Of note, patient has removed ureteral stent via tether as not seen on CT A/P performed on admission.  Prior 7 mm distal left ureteral calculus also no longer seen on repeat imaging on admission.  Macrocytosis/vitamin B12 deficiency: Hemoglobin stable.  Folate normal.  Low vitamin B12.  Started on oral supplementation           DVT prophylaxis:enoxaparin (LOVENOX) injection 40 mg Start: 09/09/22 1000     Code Status: Full Code  Family Communication: Wife at bedside  Patient status:Obs  Patient is from :Home  Anticipated discharge JK:DTOI  Estimated DC date:1-2 days  Consultants: None  Procedures:None  Antimicrobials:  Anti-infectives (From admission, onward)    Start     Dose/Rate Route Frequency Ordered Stop   09/09/22 1000  cefTRIAXone (ROCEPHIN) 2 g in sodium chloride 0.9 % 100 mL IVPB        2 g 200 mL/hr over 30 Minutes Intravenous Every 24 hours 09/09/22 0908     09/08/22 1800  metroNIDAZOLE (FLAGYL) IVPB 500 mg        500 mg 100 mL/hr over 60 Minutes Intravenous 2 times daily 09/08/22 1607     09/08/22 1300  cefTRIAXone (ROCEPHIN) 2 g in sodium chloride 0.9 % 100 mL IVPB        2 g 200 mL/hr over 30 Minutes Intravenous  Once 09/08/22 1251 09/08/22 1338  Subjective: Patient seen and examined at the bedside today.  Denies any abdomen pain, nausea or vomiting at rest.  Tolerating diet.  Objective: Vitals:   09/08/22 1534 09/08/22 1933 09/08/22 2329 09/09/22 0337  BP: (!) 139/95 (!) 131/90 121/82 128/88  Pulse: 72 78 66 65  Resp: '18 20 20 20   '$ Temp: 98.3 F (36.8 C) 98.6 F (37 C) 98.6 F (37 C) 99.5 F (37.5 C)  TempSrc: Oral Oral Oral Oral  SpO2: 98% 94% 97% 96%  Weight: 109.6 kg     Height: '6\' 2"'$  (1.88 m)       Intake/Output Summary (Last 24 hours) at 09/09/2022 0908 Last data filed at 09/09/2022 0700 Gross per 24 hour  Intake 1753.25 ml  Output 1075 ml  Net 678.25 ml   Filed Weights   09/08/22 1534  Weight: 109.6 kg    Examination:  General exam: Overall comfortable, not in distress HEENT: PERRL Respiratory system:  no wheezes or crackles  Cardiovascular system: S1 & S2 heard, RRR.  Gastrointestinal system: Abdomen is nondistended, soft and mostly nontender.  Mild right upper quadrant tenderness on deep palpation Central nervous system: Alert and oriented Extremities: No edema, no clubbing ,no cyanosis Skin: No rashes, no ulcers,no icterus     Data Reviewed: I have personally reviewed following labs and imaging studies  CBC: Recent Labs  Lab 09/07/22 0000 09/08/22 1114 09/09/22 0454  WBC 29.1* 24.0* 16.7*  NEUTROABS 25,026* 19.7* 12.7*  HGB 18.1* 17.5* 14.9  HCT 49.7 49.7 44.3  MCV 98.4 100.8* 104.0*  PLT 297 302 676   Basic Metabolic Panel: Recent Labs  Lab 09/07/22 0000 09/08/22 1114 09/09/22 0454  NA 133* 134* 140  K 4.5 4.3 4.5  CL 95* 97* 105  CO2 '27 26 27  '$ GLUCOSE 124* 122* 122*  BUN 21 30* 26*  CREATININE 1.24 1.25* 1.03  CALCIUM 9.2 9.8 8.4*  MG  --   --  2.0     No results found for this or any previous visit (from the past 240 hour(s)).   Radiology Studies: CT ABDOMEN PELVIS W CONTRAST  Result Date: 09/08/2022 CLINICAL DATA:  Abdominal pain EXAM: CT ABDOMEN AND PELVIS WITH CONTRAST TECHNIQUE: Multidetector CT imaging of the abdomen and pelvis was performed using the standard protocol following bolus administration of intravenous contrast. RADIATION DOSE REDUCTION: This exam was performed according to the departmental dose-optimization program which includes automated  exposure control, adjustment of the mA and/or kV according to patient size and/or use of iterative reconstruction technique. CONTRAST:  69m OMNIPAQUE IOHEXOL 300 MG/ML  SOLN COMPARISON:  07/15/2022 FINDINGS: Lower chest: Visualized lower lung fields are clear. Small pericardial effusion is present. Hepatobiliary: There is fatty infiltration in liver. There are few small smooth marginated low-density lesions measuring up to 14 mm in size, possibly cysts or hemangiomas. There is no dilation of bile ducts. Gallbladder is not distended. Pancreas: No focal abnormalities are seen. Spleen: Spleen is not seen. Surgical clips are seen in left upper quadrant of abdomen. Adrenals/Urinary Tract: There is 1.7 cm nodule in left adrenal with no significant interval change in comparison with an earlier CT done on 02/17/2015 suggesting possible adenoma. There is no hydronephrosis. There is subtle inhomogeneous cortical enhancement in the kidneys, more so in the lateral aspect of upper pole of right kidney. There is minimal perinephric stranding. There are no renal stones. Previously described 7 mm distal left ureteral calculus is not evident. Evaluation of urinary bladder is limited by beam hardening  artifacts. As far as seen, there is wall thickening in the urinary bladder. There is incomplete distention of the bladder. Stomach/Bowel: Stomach is unremarkable. Small bowel loops are not dilated. Appendix is not dilated. There is no significant wall thickening in colon. Few diverticula are seen in colon without signs of diverticulitis. Vascular/Lymphatic: There are scattered arterial calcifications. There is tortuosity of abdominal aorta. There are few slightly enlarged lymph nodes adjacent to porta hepatis each measuring less than 1 cm in short axis. In the image 18 of series 2, there is 13 mm smooth marginated structure with fluid density measurement in retrocrural region to the left of aorta. There are other subcentimeter nodes in  retroperitoneum. Reproductive: There is inhomogeneous attenuation in prostate with small calcifications. Left hydrocele is seen. Other: There is no ascites or pneumoperitoneum. There is paraumbilical hernia containing fat. Musculoskeletal: There is arthroplasty in both hips. Degenerative changes are noted in lumbar spine with posterior bony spurs causing spinal stenosis and encroachment of neural foramina from L1-2 L4 levels. Encroachment of neural foramina is noted from L1-S1 levels. IMPRESSION: There is diffuse wall thickening in the urinary bladder. There is subtle inhomogeneous cortical enhancement in the kidneys. Findings suggest possible cystitis and pyelonephritis. There is no hydronephrosis. There is no evidence of intestinal obstruction or pneumoperitoneum. Appendix is not dilated. Few diverticula are seen in colon without signs of focal diverticulitis. Small pericardial effusion. Fatty liver. Possible cysts or hemangiomas are noted in liver. There is 1.7 cm nodule in left adrenal with no significant change suggesting possible adenoma. There is 1.3 cm smooth marginated fluid density lesion in retrocrural region, possibly a cyst or cystic degeneration of retrocrural lymph node. There are few subcentimeter nodes in retroperitoneum and mesentery, possibly suggesting benign reactive hyperplasia. Left hydrocele is seen. Severe lumbar spondylosis. Other findings as described in the body of the report. Electronically Signed   By: Elmer Picker M.D.   On: 09/08/2022 12:36   DG Chest Portable 1 View  Result Date: 09/08/2022 CLINICAL DATA:  Cough; abnormal labs EXAM: PORTABLE CHEST 1 VIEW COMPARISON:  12/02/2016 FINDINGS: Cardiac and mediastinal contours are within normal limits. No focal pulmonary opacity. Azygous fissure. No pleural effusion or pneumothorax. No acute osseous abnormality. IMPRESSION: No acute cardiopulmonary process. Electronically Signed   By: Merilyn Baba M.D.   On: 09/08/2022 11:49    US Abdomen Limited RUQ (LIVER/GB)  Result Date: 09/07/2022 CLINICAL DATA:  RIGHT upper quadrant pain. EXAM: ULTRASOUND ABDOMEN LIMITED RIGHT UPPER QUADRANT COMPARISON:  CT 07/15/2022 FINDINGS: Gallbladder: No gallstones or wall thickening visualized. No sonographic Murphy sign noted by sonographer. Common bile duct: Diameter: Normal at 4 mm Liver: Liver is increased in echogenicity uniformly. Along the gallbladder fossa there is a 4.7 x 1.9 x 5.3 cm well demarcated region of hypoechogenicity. No blood flow within this lesion. Portal vein is patent on color Doppler imaging with normal direction of blood flow towards the liver. Other: No free fluid IMPRESSION: 1. Normal gallbladder and biliary tree 2. Well-circumscribed anechoic lesion adjacent to the gallbladder fossa. No corresponding lesion on comparison CT. Favor focal fatty sparing along the gallbladder fossa however due to relatively large size and incomplete determination, recommend MRI hepatic protocol with contrast. These results will be called to the ordering clinician or representative by the Radiologist Assistant, and communication documented in the PACS or [employer]. Electronically Signed   By: Suzy Bouchard M.D.   On: 09/07/2022 14:04    Scheduled Meds:  enoxaparin (LOVENOX) injection  40 mg  Subcutaneous Daily   polyethylene glycol  17 g Oral Daily   senna-docusate  1 tablet Oral BID   Continuous Infusions:  cefTRIAXone (ROCEPHIN)  IV     lactated ringers 100 mL/hr at 09/09/22 0753   metronidazole 500 mg (09/08/22 1743)     LOS: 0 days   Shelly Coss, MD [facility] P9/20/2023, 9:08 AM

## 2022-09-10 ENCOUNTER — Other Ambulatory Visit (HOSPITAL_BASED_OUTPATIENT_CLINIC_OR_DEPARTMENT_OTHER): Payer: Self-pay

## 2022-09-10 DIAGNOSIS — N12 Tubulo-interstitial nephritis, not specified as acute or chronic: Secondary | ICD-10-CM | POA: Diagnosis not present

## 2022-09-10 LAB — CBC WITH DIFFERENTIAL/PLATELET
Abs Immature Granulocytes: 0.12 10*3/uL — ABNORMAL HIGH (ref 0.00–0.07)
Basophils Absolute: 0.1 10*3/uL (ref 0.0–0.1)
Basophils Relative: 1 %
Eosinophils Absolute: 0.1 10*3/uL (ref 0.0–0.5)
Eosinophils Relative: 1 %
HCT: 46.8 % (ref 39.0–52.0)
Hemoglobin: 15.2 g/dL (ref 13.0–17.0)
Immature Granulocytes: 1 %
Lymphocytes Relative: 18 %
Lymphs Abs: 2.3 10*3/uL (ref 0.7–4.0)
MCH: 34.8 pg — ABNORMAL HIGH (ref 26.0–34.0)
MCHC: 32.5 g/dL (ref 30.0–36.0)
MCV: 107.1 fL — ABNORMAL HIGH (ref 80.0–100.0)
Monocytes Absolute: 1.3 10*3/uL — ABNORMAL HIGH (ref 0.1–1.0)
Monocytes Relative: 10 %
Neutro Abs: 8.9 10*3/uL — ABNORMAL HIGH (ref 1.7–7.7)
Neutrophils Relative %: 69 %
Platelets: 364 10*3/uL (ref 150–400)
RBC: 4.37 MIL/uL (ref 4.22–5.81)
RDW: 13.3 % (ref 11.5–15.5)
WBC: 12.9 10*3/uL — ABNORMAL HIGH (ref 4.0–10.5)
nRBC: 0 % (ref 0.0–0.2)

## 2022-09-10 LAB — COMPREHENSIVE METABOLIC PANEL
ALT: 31 U/L (ref 0–44)
AST: 20 U/L (ref 15–41)
Albumin: 2.6 g/dL — ABNORMAL LOW (ref 3.5–5.0)
Alkaline Phosphatase: 65 U/L (ref 38–126)
Anion gap: 4 — ABNORMAL LOW (ref 5–15)
BUN: 21 mg/dL — ABNORMAL HIGH (ref 6–20)
CO2: 31 mmol/L (ref 22–32)
Calcium: 8.9 mg/dL (ref 8.9–10.3)
Chloride: 108 mmol/L (ref 98–111)
Creatinine, Ser: 1.16 mg/dL (ref 0.61–1.24)
GFR, Estimated: >60 mL/min (ref >60)
Glucose, Bld: 117 mg/dL — ABNORMAL HIGH (ref 70–99)
Potassium: 5.1 mmol/L (ref 3.5–5.1)
Sodium: 143 mmol/L (ref 135–145)
Total Bilirubin: 0.4 mg/dL (ref 0.3–1.2)
Total Protein: 6.3 g/dL — ABNORMAL LOW (ref 6.5–8.1)

## 2022-09-10 LAB — URINE CULTURE: Culture: >=100000 — AB

## 2022-09-10 MED ORDER — CYANOCOBALAMIN 1000 MCG PO TABS: 1000.0000 ug | ORAL_TABLET | Freq: Every day | ORAL | 0 refills | Status: AC

## 2022-09-10 MED ORDER — CEFDINIR 300 MG PO CAPS: 300.0000 mg | ORAL_CAPSULE | Freq: Two times a day (BID) | ORAL | 0 refills | Status: AC

## 2022-09-10 MED ORDER — CEFDINIR 300 MG PO CAPS
300.0000 mg | ORAL_CAPSULE | Freq: Two times a day (BID) | ORAL | 0 refills | Status: DC
  Filled 2022-09-10: qty 8, 4d supply, fill #0

## 2022-09-10 MED ORDER — CYANOCOBALAMIN 1000 MCG PO TABS
1000.0000 ug | ORAL_TABLET | Freq: Every day | ORAL | 0 refills | Status: DC
  Filled 2022-09-10: qty 100, 100d supply, fill #0

## 2022-09-10 NOTE — Progress Notes (Signed)
Pt discharged per order by Dr. Tawanna Solo. Pt given discharge paperwork. IV removed. Pt wheeled out in stable condition.

## 2022-09-10 NOTE — Discharge Summary (Signed)
Physician Discharge Summary  Erik Quinn SWN:[id number] DOB: [birth year] DOA: 09/08/2022  PCP: Unk Pinto, MD  Admit date: 09/08/2022 Discharge date: 09/10/2022  Admitted From: Home Disposition:  Home  Discharge Condition:Stable CODE STATUS:FULL Diet recommendation: Regular   Brief/Interim Summary: Patient is a 56 year old male with history of nephrolithiasis, hypertension, GERD, prediabetes who presented with abdomen pain, abnormal outpatient labs.  He initially presented to his primary care physician on 9/18 with complaint of right upper quadrant pain, headache, sore throat.  Ultrasound showed well circumcised anechoic lesion adjacent to the gallbladder and was recommended for MRI hepatic protocol with contrast.  He was prescribed ciprofloxacin on 9/18.  He had leukocytosis. Further work-up after admission here showed 1.7 cm left adrenal nodule possibly adenoma, possible features of cystitis/pyelonephritis on the right, normal gallbladder and kidney.  UA also showed large leukocytes, bacteria. Started on Rocephin .  His abdominal pain has resolved, liver enzymes have been stable.  Urine culture showed pansensitive E. coli, blood cultures remain negative.  Hemodynamically stable for discharge today with oral antibiotics.  Following problems were addressed during his hospitalization:  Suspected pyelonephritis/cystitis: Imaging showed features of cystitis/right pyonephritis.  UA consistent with infection.  Leukocytosis improved.  Urine culture grew pansensitive E. coli, blood cultures remain negative.  Plan to continue total antibiotics course of 7 days, antibiotics changed to oral.   Right upper quadrant pain/abnormal MRI finding: Unclear etiology.  Outpatient  Ultrasound showed well circumcised anechoic lesion adjacent to the gallbladder and was recommended for MRI hepatic protocol with contrast.  CT abdomen/pelvis done on this admission showed normal gallbladder anatomy.   Liver enzymes  stable.  He denies any right upper quadrant pain today.  Patient does not want to do MRI here , wants to do as an outpatient.  We recommend to follow-up with his primary care physician and do MRI of the liver with contrast as an outpatient.   AKI: Resolved with IV fluids   Headache: Resolved   History of nephrolithiasis: recently underwent cystoscopy with left ureteroscopy with laser lithotripsy and left ureteral stent placement due to impacted left UVJ stone on 07/20/2022.  Patient was to follow-up again with urology approximately 1 month later for repeat renal ultrasound. Of note, patient has removed ureteral stent via tether as not seen on CT A/P performed on admission.  Prior 7 mm distal left ureteral calculus also no longer seen on repeat imaging on admission.   Macrocytosis/vitamin B12 deficiency: Hemoglobin stable.  Folate normal.  Low vitamin B12.  Started on oral supplementation         Discharge Diagnoses:  Principal Problem:   Pyelonephritis Active Problems:   Abdominal pain   AKI (acute kidney injury) ([place])   Constipation   Headache   Macrocytosis    Discharge Instructions  Discharge Instructions     Diet general   Complete by: As directed    Discharge instructions   Complete by: As directed    1)Please take prescribed medications as instructed 2)Follow up with your PCP in a week 3)Do an MRI of liver with contrast as an outpatient to further investigate the abnormal finding seen on the ultrasound.   Increase activity slowly   Complete by: As directed       Allergies as of 09/10/2022   No Known Allergies      Medication List     STOP taking these medications    ciprofloxacin 500 MG tablet Commonly known as: Cipro       TAKE these medications  cefdinir 300 MG capsule Commonly known as: OMNICEF Take 1 capsule (300 mg total) by mouth 2 (two) times daily for 4 days. Start taking on: September 11, 2022   cyanocobalamin 1000 MCG tablet Take 1  tablet (1,000 mcg total) by mouth daily. Start taking on: September 11, 2022   diphenhydrAMINE 25 MG tablet Commonly known as: BENADRYL Take 25 mg by mouth every 6 (six) hours as needed.   guaiFENesin 600 MG 12 hr tablet Commonly known as: MUCINEX Take 600 mg by mouth 2 (two) times daily.   ZZZQUIL PO Take by mouth.        Follow-up Information     Unk Pinto, MD. Schedule an appointment as soon as possible for a visit in 1 week(s).   Specialty: Internal Medicine Contact information: [address] [place] [address] [phone]                No Known Allergies  Consultations: None   Procedures/Studies: CT ABDOMEN PELVIS W CONTRAST  Result Date: 09/08/2022 CLINICAL DATA:  Abdominal pain EXAM: CT ABDOMEN AND PELVIS WITH CONTRAST TECHNIQUE: Multidetector CT imaging of the abdomen and pelvis was performed using the standard protocol following bolus administration of intravenous contrast. RADIATION DOSE REDUCTION: This exam was performed according to the departmental dose-optimization program which includes automated exposure control, adjustment of the mA and/or kV according to patient size and/or use of iterative reconstruction technique. CONTRAST:  78m OMNIPAQUE IOHEXOL 300 MG/ML  SOLN COMPARISON:  07/15/2022 FINDINGS: Lower chest: Visualized lower lung fields are clear. Small pericardial effusion is present. Hepatobiliary: There is fatty infiltration in liver. There are few small smooth marginated low-density lesions measuring up to 14 mm in size, possibly cysts or hemangiomas. There is no dilation of bile ducts. Gallbladder is not distended. Pancreas: No focal abnormalities are seen. Spleen: Spleen is not seen. Surgical clips are seen in left upper quadrant of abdomen. Adrenals/Urinary Tract: There is 1.7 cm nodule in left adrenal with no significant interval change in comparison with an earlier CT done on 02/17/2015 suggesting possible adenoma.  There is no hydronephrosis. There is subtle inhomogeneous cortical enhancement in the kidneys, more so in the lateral aspect of upper pole of right kidney. There is minimal perinephric stranding. There are no renal stones. Previously described 7 mm distal left ureteral calculus is not evident. Evaluation of urinary bladder is limited by beam hardening artifacts. As far as seen, there is wall thickening in the urinary bladder. There is incomplete distention of the bladder. Stomach/Bowel: Stomach is unremarkable. Small bowel loops are not dilated. Appendix is not dilated. There is no significant wall thickening in colon. Few diverticula are seen in colon without signs of diverticulitis. Vascular/Lymphatic: There are scattered arterial calcifications. There is tortuosity of abdominal aorta. There are few slightly enlarged lymph nodes adjacent to porta hepatis each measuring less than 1 cm in short axis. In the image 18 of series 2, there is 13 mm smooth marginated structure with fluid density measurement in retrocrural region to the left of aorta. There are other subcentimeter nodes in retroperitoneum. Reproductive: There is inhomogeneous attenuation in prostate with small calcifications. Left hydrocele is seen. Other: There is no ascites or pneumoperitoneum. There is paraumbilical hernia containing fat. Musculoskeletal: There is arthroplasty in both hips. Degenerative changes are noted in lumbar spine with posterior bony spurs causing spinal stenosis and encroachment of neural foramina from L1-2 L4 levels. Encroachment of neural foramina is noted from L1-S1 levels. IMPRESSION: There is diffuse wall  thickening in the urinary bladder. There is subtle inhomogeneous cortical enhancement in the kidneys. Findings suggest possible cystitis and pyelonephritis. There is no hydronephrosis. There is no evidence of intestinal obstruction or pneumoperitoneum. Appendix is not dilated. Few diverticula are seen in colon without signs  of focal diverticulitis. Small pericardial effusion. Fatty liver. Possible cysts or hemangiomas are noted in liver. There is 1.7 cm nodule in left adrenal with no significant change suggesting possible adenoma. There is 1.3 cm smooth marginated fluid density lesion in retrocrural region, possibly a cyst or cystic degeneration of retrocrural lymph node. There are few subcentimeter nodes in retroperitoneum and mesentery, possibly suggesting benign reactive hyperplasia. Left hydrocele is seen. Severe lumbar spondylosis. Other findings as described in the body of the report. Electronically Signed   By: Elmer Picker M.D.   On: 09/08/2022 12:36   DG Chest Portable 1 View  Result Date: 09/08/2022 CLINICAL DATA:  Cough; abnormal labs EXAM: PORTABLE CHEST 1 VIEW COMPARISON:  12/02/2016 FINDINGS: Cardiac and mediastinal contours are within normal limits. No focal pulmonary opacity. Azygous fissure. No pleural effusion or pneumothorax. No acute osseous abnormality. IMPRESSION: No acute cardiopulmonary process. Electronically Signed   By: Merilyn Baba M.D.   On: 09/08/2022 11:49   US Abdomen Limited RUQ (LIVER/GB)  Result Date: 09/07/2022 CLINICAL DATA:  RIGHT upper quadrant pain. EXAM: ULTRASOUND ABDOMEN LIMITED RIGHT UPPER QUADRANT COMPARISON:  CT 07/15/2022 FINDINGS: Gallbladder: No gallstones or wall thickening visualized. No sonographic Murphy sign noted by sonographer. Common bile duct: Diameter: Normal at 4 mm Liver: Liver is increased in echogenicity uniformly. Along the gallbladder fossa there is a 4.7 x 1.9 x 5.3 cm well demarcated region of hypoechogenicity. No blood flow within this lesion. Portal vein is patent on color Doppler imaging with normal direction of blood flow towards the liver. Other: No free fluid IMPRESSION: 1. Normal gallbladder and biliary tree 2. Well-circumscribed anechoic lesion adjacent to the gallbladder fossa. No corresponding lesion on comparison CT. Favor focal fatty sparing  along the gallbladder fossa however due to relatively large size and incomplete determination, recommend MRI hepatic protocol with contrast. These results will be called to the ordering clinician or representative by the Radiologist Assistant, and communication documented in the PACS or [employer]. Electronically Signed   By: Suzy Bouchard M.D.   On: 09/07/2022 14:04      Subjective: Patient seen and examined at the bedside today.  Hemodynamically stable for discharge.  Denies any abdomen pain  Discharge Exam: Vitals:   09/09/22 2041 09/10/22 0556  BP: 132/80 (!) 154/99  Pulse: 68 (!) 58  Resp: 20 20  Temp: 98.5 F (36.9 C) 98 F (36.7 C)  SpO2: 96% 98%   Vitals:   09/09/22 0337 09/09/22 1136 09/09/22 2041 09/10/22 0556  BP: 128/88 (!) 134/90 132/80 (!) 154/99  Pulse: 65 (!) 48 68 (!) 58  Resp: '20 18 20 20  '$ Temp: 99.5 F (37.5 C) 98.4 F (36.9 C) 98.5 F (36.9 C) 98 F (36.7 C)  TempSrc: Oral Oral Oral Oral  SpO2: 96% 97% 96% 98%  Weight:      Height:        General: Pt is alert, awake, not in acute distress Cardiovascular: RRR, S1/S2 +, no rubs, no gallops Respiratory: CTA bilaterally, no wheezing, no rhonchi Abdominal: Soft, NT, ND, bowel sounds + Extremities: no edema, no cyanosis    The results of significant diagnostics from this hospitalization (including imaging, microbiology, ancillary and laboratory) are listed below for reference.  Microbiology: Recent Results (from the past 240 hour(s))  Urine Culture     Status: Abnormal   Collection Time: 09/08/22 12:00 PM   Specimen: Urine, Clean Catch  Result Value Ref Range Status   Specimen Description   Final    URINE, CLEAN CATCH Performed at [facility], [address], [place], [place] [address]    Special Requests   Final    NONE Performed at [facility], [address], [place], [place] [address]    Culture >=100,000 COLONIES/mL ESCHERICHIA COLI (A)   Final   Report Status 09/10/2022 FINAL  Final   Organism ID, Bacteria ESCHERICHIA COLI (A)  Final      Susceptibility   Escherichia coli - MIC*    AMPICILLIN >=32 RESISTANT Resistant     CEFAZOLIN <=4 SENSITIVE Sensitive     CEFEPIME <=0.12 SENSITIVE Sensitive     CEFTRIAXONE <=0.25 SENSITIVE Sensitive     CIPROFLOXACIN <=0.25 SENSITIVE Sensitive     GENTAMICIN <=1 SENSITIVE Sensitive     IMIPENEM <=0.25 SENSITIVE Sensitive     NITROFURANTOIN <=16 SENSITIVE Sensitive     TRIMETH/SULFA <=20 SENSITIVE Sensitive     AMPICILLIN/SULBACTAM 16 INTERMEDIATE Intermediate     PIP/TAZO <=4 SENSITIVE Sensitive     * >=100,000 COLONIES/mL ESCHERICHIA COLI  Culture, blood (Routine X 2) w Reflex to ID Panel     Status: None (Preliminary result)   Collection Time: 09/08/22  4:39 PM   Specimen: BLOOD  Result Value Ref Range Status   Specimen Description   Final    BLOOD BLOOD LEFT ARM Performed at [facility] [address], [place], [place] [address]    Special Requests   Final    BOTTLES DRAWN AEROBIC ONLY Blood Culture adequate volume Performed at [facility] [address] [place], [place] [address]    Culture   Final    NO GROWTH 2 DAYS Performed at [facility] [address], [place], [place] [address]    Report Status PENDING  Incomplete  Culture, blood (Routine X 2) w Reflex to ID Panel     Status: None (Preliminary result)   Collection Time: 09/08/22  4:39 PM   Specimen: BLOOD  Result Value Ref Range Status   Specimen Description   Final    BLOOD BLOOD RIGHT HAND Performed at [facility] [address] [place], [place] [address]    Special Requests   Final    BOTTLES DRAWN AEROBIC ONLY Blood Culture adequate volume Performed at [facility] [address] [place], [place] [address]    Culture   Final    NO GROWTH 2 DAYS Performed at [facility] [address],  [place], [place] [address]    Report Status PENDING  Incomplete     Labs: BNP (last 3 results) No results for input(s): "BNP" in the last 8760 hours. Basic Metabolic Panel: Recent Labs  Lab 09/07/22 0000 09/08/22 1114 09/09/22 0454 09/10/22 0503  NA 133* 134* 140 143  K 4.5 4.3 4.5 5.1  CL 95* 97* 105 108  CO2 '27 26 27 31  '$ GLUCOSE 124* 122* 122* 117*  BUN 21 30* 26* 21*  CREATININE 1.24 1.25* 1.03 1.16  CALCIUM 9.2 9.8 8.4* 8.9  MG  --   --  2.0  --    Liver Function Tests: Recent Labs  Lab 09/07/22 0000 09/08/22 1114 09/09/22 0454 09/10/22 0503  AST 27  31 32 20  ALT 31 39 39 31  ALKPHOS  --  81 66 65  BILITOT 1.0 0.7 0.5 0.4  PROT 6.8 7.7 6.0* 6.3*  ALBUMIN  --  3.6 2.4* 2.6*   Recent Labs  Lab 09/07/22 0000 09/08/22 1114  LIPASE 33 27  AMYLASE 52  --    No results for input(s): "AMMONIA" in the last 168 hours. CBC: Recent Labs  Lab 09/07/22 0000 09/08/22 1114 09/09/22 0454 09/10/22 0503  WBC 29.1* 24.0* 16.7* 12.9*  NEUTROABS 25,026* 19.7* 12.7* 8.9*  HGB 18.1* 17.5* 14.9 15.2  HCT 49.7 49.7 44.3 46.8  MCV 98.4 100.8* 104.0* 107.1*  PLT 297 302 340 364   Cardiac Enzymes: No results for input(s): "CKTOTAL", "CKMB", "CKMBINDEX", "TROPONINI" in the last 168 hours. BNP: Invalid input(s): "POCBNP" CBG: No results for input(s): "GLUCAP" in the last 168 hours. D-Dimer No results for input(s): "DDIMER" in the last 72 hours. Hgb A1c No results for input(s): "HGBA1C" in the last 72 hours. Lipid Profile No results for input(s): "CHOL", "HDL", "LDLCALC", "TRIG", "CHOLHDL", "LDLDIRECT" in the last 72 hours. Thyroid function studies No results for input(s): "TSH", "T4TOTAL", "T3FREE", "THYROIDAB" in the last 72 hours.  Invalid input(s): "FREET3" Anemia work up Recent Labs    09/09/22 0454  VITAMINB12 287  FOLATE 12.0   Urinalysis    Component Value Date/Time   COLORURINE YELLOW 09/08/2022 1200   APPEARANCEUR HAZY (A) 09/08/2022 1200   LABSPEC  1.015 09/08/2022 1200   PHURINE 5.5 09/08/2022 1200   GLUCOSEU NEGATIVE 09/08/2022 1200   HGBUR MODERATE (A) 09/08/2022 1200   BILIRUBINUR NEGATIVE 09/08/2022 1200   KETONESUR NEGATIVE 09/08/2022 1200   PROTEINUR 30 (A) 09/08/2022 1200   UROBILINOGEN 0.2 02/17/2015 1306   NITRITE NEGATIVE 09/08/2022 1200   LEUKOCYTESUR LARGE (A) 09/08/2022 1200   Sepsis Labs Recent Labs  Lab 09/07/22 0000 09/08/22 1114 09/09/22 0454 09/10/22 0503  WBC 29.1* 24.0* 16.7* 12.9*   Microbiology Recent Results (from the past 240 hour(s))  Urine Culture     Status: Abnormal   Collection Time: 09/08/22 12:00 PM   Specimen: Urine, Clean Catch  Result Value Ref Range Status   Specimen Description   Final    URINE, CLEAN CATCH Performed at [facility], [address], [place], [place] [address]    Special Requests   Final    NONE Performed at [facility], [address] [place], [place] [address]    Culture >=100,000 COLONIES/mL ESCHERICHIA COLI (A)  Final   Report Status 09/10/2022 FINAL  Final   Organism ID, Bacteria ESCHERICHIA COLI (A)  Final      Susceptibility   Escherichia coli - MIC*    AMPICILLIN >=32 RESISTANT Resistant     CEFAZOLIN <=4 SENSITIVE Sensitive     CEFEPIME <=0.12 SENSITIVE Sensitive     CEFTRIAXONE <=0.25 SENSITIVE Sensitive     CIPROFLOXACIN <=0.25 SENSITIVE Sensitive     GENTAMICIN <=1 SENSITIVE Sensitive     IMIPENEM <=0.25 SENSITIVE Sensitive     NITROFURANTOIN <=16 SENSITIVE Sensitive     TRIMETH/SULFA <=20 SENSITIVE Sensitive     AMPICILLIN/SULBACTAM 16 INTERMEDIATE Intermediate     PIP/TAZO <=4 SENSITIVE Sensitive     * >=100,000 COLONIES/mL ESCHERICHIA COLI  Culture, blood (Routine X 2) w Reflex to ID Panel     Status: None (Preliminary result)   Collection Time: 09/08/22  4:39 PM   Specimen: BLOOD  Result Value Ref Range Status   Specimen Description  Final    BLOOD BLOOD LEFT ARM Performed at [facility] [address], [place], [place] [address]    Special Requests   Final    BOTTLES DRAWN AEROBIC ONLY Blood Culture adequate volume Performed at [facility] [address] [place], [place] [address]    Culture   Final    NO GROWTH 2 DAYS Performed at [facility] [address] [place], [place] [address]    Report Status PENDING  Incomplete  Culture, blood (Routine X 2) w Reflex to ID Panel     Status: None (Preliminary result)   Collection Time: 09/08/22  4:39 PM   Specimen: BLOOD  Result Value Ref Range Status   Specimen Description   Final    BLOOD BLOOD RIGHT HAND Performed at [facility] [address] [place], [place] [address]    Special Requests   Final    BOTTLES DRAWN AEROBIC ONLY Blood Culture adequate volume Performed at [facility] [address] [place], [place] [address]    Culture   Final    NO GROWTH 2 DAYS Performed at [facility] [address], [place], [place] [address]    Report Status PENDING  Incomplete    Please note: You were cared for by a hospitalist during your hospital stay. Once you are discharged, your primary care physician will handle any further medical issues. Please note that NO REFILLS for any discharge medications will be authorized once you are discharged, as it is imperative that you return to your primary care physician (or establish a relationship with a primary care physician if you do not have one) for your post hospital discharge needs so that they can reassess your need for medications and monitor your lab values.    Time coordinating discharge: 40 minutes  SIGNED:   Shelly Coss, MD  [facility] 09/10/2022, 9:53 AM Pager [phone]  If 7PM-7AM, please contact night-coverage [email or web address] Password [email or web address]

## 2022-09-10 NOTE — Progress Notes (Signed)
Mobility Specialist - Progress Note   09/10/22 0949  Mobility  HOB Elevated/Bed Position Self regulated  Activity Ambulated independently in hallway  Range of Motion/Exercises Active  Level of Assistance Independent  Assistive Device None  Distance Ambulated (ft) 330 ft  Activity Response Tolerated well  Transport method Ambulatory  $Mobility charge 1 Mobility   Pt received in bed and agreeable to mobility. No C/o during ambulation.  Pt to bed after session with all needs met.     [facility]

## 2022-09-13 LAB — CULTURE, BLOOD (ROUTINE X 2)
Culture: NO GROWTH
Culture: NO GROWTH
Special Requests: ADEQUATE
Special Requests: ADEQUATE

## 2022-09-21 ENCOUNTER — Other Ambulatory Visit: Payer: Self-pay | Admitting: Nurse Practitioner

## 2022-09-21 ENCOUNTER — Telehealth: Payer: Self-pay | Admitting: Nurse Practitioner

## 2022-09-21 DIAGNOSIS — K828 Other specified diseases of gallbladder: Secondary | ICD-10-CM

## 2022-09-21 DIAGNOSIS — R9389 Abnormal findings on diagnostic imaging of other specified body structures: Secondary | ICD-10-CM

## 2022-09-21 NOTE — Telephone Encounter (Signed)
Pt was in the hosp over the weekend, wanting to be sent for his Mri bc it was already approved by his insurance

## 2022-09-21 NOTE — Telephone Encounter (Signed)
Can you discuss this with him.  He had the CT but I guess we can proceed with the MRI

## 2022-09-23 NOTE — Progress Notes (Deleted)
Hospital follow up  Assessment and Plan: Hospital visit follow up for:      All medications were reviewed with patient and family and fully reconciled. All questions answered fully, and patient and family members were encouraged to call the office with any further questions or concerns. Discussed goal to avoid readmission related to this diagnosis.  There are no discontinued medications.  CAN NOT DO FOR BCBS REGULAR OR MEDICARE***  Over 40 minutes of exam, counseling, chart review, and complex, high/moderate level critical decision making was performed this visit.   Future Appointments  Date Time Provider [place]  09/24/2022  9:00 AM Darrol Jump, NP [facility] None  09/30/2022  7:00 PM [facility] 1 [facility] [place]  12/10/2022 10:00 AM Unk Pinto, MD [facility] None     HPI 56 y.o.male presents for follow up for transition from recent hospitalization or SN[facility] stay. Admit date to the hospital was 09/08/22, patient was discharged from the hospital on 09/10/22 and our clinical staff contacted the office the day after discharge to set up a follow up appointment. The discharge summary, medications, and diagnostic test results were reviewed before meeting with the patient. The patient was admitted for:   He initially presented to his primary care physician on 9/18 with complaint of right upper quadrant pain, headache, sore throat.  Ultrasound showed well circumcised anechoic lesion adjacent to the gallbladder and was recommended for MRI hepatic protocol with contrast.  He was prescribed ciprofloxacin on 9/18.  He had leukocytosis.  Further work-up after admission here showed 1.7 cm left adrenal nodule possibly adenoma, possible features of cystitis/pyelonephritis on the right, normal gallbladder and kidney.  UA also showed large leukocytes, bacteria. Started on Rocephin .  His abdominal pain has resolved, liver enzymes have been stable.  Urine culture showed pansensitive E. coli,  blood cultures remain negative.  Hemodynamically stable for discharge today with oral antibiotics.  Imaging showed features of cystitis/right pyonephritis.  UA consistent with infection.  Leukocytosis improved.  Urine culture grew pansensitive E. coli, blood cultures remain negative.  Plan to continue total antibiotics course of 7 days, antibiotics changed to oral.  Unclear etiology.  Outpatient  Ultrasound showed well circumcised anechoic lesion adjacent to the gallbladder and was recommended for MRI hepatic protocol with contrast.  CT abdomen/pelvis done on this admission showed normal gallbladder anatomy.   Liver enzymes stable.  He denies any right upper quadrant pain today.  Patient does not want to do MRI here , wants to do as an outpatient.  We recommend to follow-up with his primary care physician and do MRI of the liver with contrast as an outpatient.  Recently underwent cystoscopy with left ureteroscopy with laser lithotripsy and left ureteral stent placement due to impacted left UVJ stone on 07/20/2022.  Patient was to follow-up again with urology approximately 1 month later for repeat renal ultrasound. Of note, patient has removed ureteral stent via tether as not seen on CT A/P performed on admission.  Prior 7 mm distal left ureteral calculus also no longer seen on repeat imaging on admission.   Home health {ACTION; IS/IS [id number]} involved.   Images while in the hospital: CT ABDOMEN PELVIS W CONTRAST  Result Date: 09/08/2022 CLINICAL DATA:  Abdominal pain EXAM: CT ABDOMEN AND PELVIS WITH CONTRAST TECHNIQUE: Multidetector CT imaging of the abdomen and pelvis was performed using the standard protocol following bolus administration of intravenous contrast. RADIATION DOSE REDUCTION: This exam was performed according to the departmental dose-optimization program which includes automated exposure  control, adjustment of the mA and/or kV according to patient size and/or use of iterative  reconstruction technique. CONTRAST:  54m OMNIPAQUE IOHEXOL 300 MG/ML  SOLN COMPARISON:  07/15/2022 FINDINGS: Lower chest: Visualized lower lung fields are clear. Small pericardial effusion is present. Hepatobiliary: There is fatty infiltration in liver. There are few small smooth marginated low-density lesions measuring up to 14 mm in size, possibly cysts or hemangiomas. There is no dilation of bile ducts. Gallbladder is not distended. Pancreas: No focal abnormalities are seen. Spleen: Spleen is not seen. Surgical clips are seen in left upper quadrant of abdomen. Adrenals/Urinary Tract: There is 1.7 cm nodule in left adrenal with no significant interval change in comparison with an earlier CT done on 02/17/2015 suggesting possible adenoma. There is no hydronephrosis. There is subtle inhomogeneous cortical enhancement in the kidneys, more so in the lateral aspect of upper pole of right kidney. There is minimal perinephric stranding. There are no renal stones. Previously described 7 mm distal left ureteral calculus is not evident. Evaluation of urinary bladder is limited by beam hardening artifacts. As far as seen, there is wall thickening in the urinary bladder. There is incomplete distention of the bladder. Stomach/Bowel: Stomach is unremarkable. Small bowel loops are not dilated. Appendix is not dilated. There is no significant wall thickening in colon. Few diverticula are seen in colon without signs of diverticulitis. Vascular/Lymphatic: There are scattered arterial calcifications. There is tortuosity of abdominal aorta. There are few slightly enlarged lymph nodes adjacent to porta hepatis each measuring less than 1 cm in short axis. In the image 18 of series 2, there is 13 mm smooth marginated structure with fluid density measurement in retrocrural region to the left of aorta. There are other subcentimeter nodes in retroperitoneum. Reproductive: There is inhomogeneous attenuation in prostate with small  calcifications. Left hydrocele is seen. Other: There is no ascites or pneumoperitoneum. There is paraumbilical hernia containing fat. Musculoskeletal: There is arthroplasty in both hips. Degenerative changes are noted in lumbar spine with posterior bony spurs causing spinal stenosis and encroachment of neural foramina from L1-2 L4 levels. Encroachment of neural foramina is noted from L1-S1 levels. IMPRESSION: There is diffuse wall thickening in the urinary bladder. There is subtle inhomogeneous cortical enhancement in the kidneys. Findings suggest possible cystitis and pyelonephritis. There is no hydronephrosis. There is no evidence of intestinal obstruction or pneumoperitoneum. Appendix is not dilated. Few diverticula are seen in colon without signs of focal diverticulitis. Small pericardial effusion. Fatty liver. Possible cysts or hemangiomas are noted in liver. There is 1.7 cm nodule in left adrenal with no significant change suggesting possible adenoma. There is 1.3 cm smooth marginated fluid density lesion in retrocrural region, possibly a cyst or cystic degeneration of retrocrural lymph node. There are few subcentimeter nodes in retroperitoneum and mesentery, possibly suggesting benign reactive hyperplasia. Left hydrocele is seen. Severe lumbar spondylosis. Other findings as described in the body of the report. Electronically Signed   By: PElmer PickerM.D.   On: 09/08/2022 12:36   DG Chest Portable 1 View  Result Date: 09/08/2022 CLINICAL DATA:  Cough; abnormal labs EXAM: PORTABLE CHEST 1 VIEW COMPARISON:  12/02/2016 FINDINGS: Cardiac and mediastinal contours are within normal limits. No focal pulmonary opacity. Azygous fissure. No pleural effusion or pneumothorax. No acute osseous abnormality. IMPRESSION: No acute cardiopulmonary process. Electronically Signed   By: AMerilyn BabaM.D.   On: 09/08/2022 11:49       Current Outpatient Medications (Respiratory):    diphenhydrAMINE (BENADRYL) 25  MG tablet, Take 25 mg by mouth every 6 (six) hours as needed.   guaiFENesin (MUCINEX) 600 MG 12 hr tablet, Take 600 mg by mouth 2 (two) times daily.   Current Outpatient Medications (Hematological):    cyanocobalamin 1000 MCG tablet, Take 1 tablet (1,000 mcg total) by mouth daily.  Current Outpatient Medications (Other):    diphenhydrAMINE HCl, Sleep, (ZZZQUIL PO), Take by mouth.  Past Medical History:  Diagnosis Date   GERD (gastroesophageal reflux disease)    History of kidney stones    Hypertension    Pt denies   Pre-diabetes      No Known Allergies  ROS: all negative except above.   Physical Exam: There were no vitals filed for this visit. There were no vitals taken for this visit. General Appearance: Well nourished, in no apparent distress. Eyes: PERRLA, EOMs, conjunctiva no swelling or erythema Sinuses: No Frontal/maxillary tenderness ENT/Mouth: Ext aud canals clear, TMs without erythema, bulging. No erythema, swelling, or exudate on post pharynx.  Tonsils not swollen or erythematous. Hearing normal.  Neck: Supple, thyroid normal.  Respiratory: Respiratory effort normal, BS equal bilaterally without rales, rhonchi, wheezing or stridor.  Cardio: RRR with no MRGs. Brisk peripheral pulses without edema.  Abdomen: Soft, + BS.  Non tender, no guarding, rebound, hernias, masses. Lymphatics: Non tender without lymphadenopathy.  Musculoskeletal: Full ROM, 5/5 strength, normal gait.  Skin: Warm, dry without rashes, lesions, ecchymosis.  Neuro: Cranial nerves intact. Normal muscle tone, no cerebellar symptoms. Sensation intact.  Psych: Awake and oriented X 3, normal affect, Insight and Judgment appropriate.     Darrol Jump, NP 9:24 PM [facility]

## 2022-09-24 ENCOUNTER — Inpatient Hospital Stay: Payer: BC Managed Care – PPO | Admitting: Nurse Practitioner

## 2022-09-30 ENCOUNTER — Ambulatory Visit (HOSPITAL_COMMUNITY)
Admission: RE | Admit: 2022-09-30 | Discharge: 2022-09-30 | Disposition: A | Discharge status: Home / Self Care | Payer: BC Managed Care – PPO | Source: Ambulatory Visit | Attending: Nurse Practitioner | Admitting: Nurse Practitioner

## 2022-09-30 DIAGNOSIS — R9389 Abnormal findings on diagnostic imaging of other specified body structures: Secondary | ICD-10-CM | POA: Diagnosis present

## 2022-09-30 DIAGNOSIS — K828 Other specified diseases of gallbladder: Secondary | ICD-10-CM | POA: Insufficient documentation

## 2022-09-30 MED ORDER — GADOBUTROL 1 MMOL/ML IV SOLN
10.0000 mL | Freq: Once | INTRAVENOUS | Status: AC | PRN
  Administered 2022-09-30: 10 mL via INTRAVENOUS

## 2022-10-05 ENCOUNTER — Encounter: Payer: Self-pay | Admitting: Nurse Practitioner

## 2022-10-05 ENCOUNTER — Ambulatory Visit (INDEPENDENT_AMBULATORY_CARE_PROVIDER_SITE_OTHER): Payer: BC Managed Care – PPO | Admitting: Nurse Practitioner

## 2022-10-05 VITALS — BP 126/90 | HR 77 | Temp 97.7°F | Ht 74.0 in | Wt 256.8 lb

## 2022-10-05 DIAGNOSIS — Z09 Encounter for follow-up examination after completed treatment for conditions other than malignant neoplasm: Secondary | ICD-10-CM

## 2022-10-05 DIAGNOSIS — R9389 Abnormal findings on diagnostic imaging of other specified body structures: Secondary | ICD-10-CM | POA: Diagnosis not present

## 2022-10-05 DIAGNOSIS — R319 Hematuria, unspecified: Secondary | ICD-10-CM

## 2022-10-05 DIAGNOSIS — R1030 Lower abdominal pain, unspecified: Secondary | ICD-10-CM | POA: Diagnosis not present

## 2022-10-05 DIAGNOSIS — K828 Other specified diseases of gallbladder: Secondary | ICD-10-CM

## 2022-10-05 DIAGNOSIS — D3502 Benign neoplasm of left adrenal gland: Secondary | ICD-10-CM

## 2022-10-05 DIAGNOSIS — Z79899 Other long term (current) drug therapy: Secondary | ICD-10-CM

## 2022-10-05 DIAGNOSIS — N200 Calculus of kidney: Secondary | ICD-10-CM | POA: Diagnosis not present

## 2022-10-05 DIAGNOSIS — N12 Tubulo-interstitial nephritis, not specified as acute or chronic: Secondary | ICD-10-CM

## 2022-10-05 NOTE — Progress Notes (Signed)
Hospital follow up  Assessment and Plan: Hospital visit follow up for:   1. Hospital discharge follow-up Reviewed hospital discharge instructions along with medications, imaging, POC. All questions and concerns addressed.  2. Gallbladder mass Resolved.   No further concerning issues.  3. Abnormal ultrasound Resolved via MRI - negative.  4. Kidney stone on left side Resolved. Continue to follow with Dr. Jens Som  5. Lower abdominal pain Resolved Continue to monitor  - CBC with Differential/Platelet - COMPLETE METABOLIC PANEL WITH GFR  6. Hematuria of unknown cause Will check UA to confirm issues has resoled.  - Microalbumin / creatinine urine ratio - Urinalysis, Routine w reflex microscopic - Urine Culture  7. Pyelonephritis Resolved.  - Microalbumin / creatinine urine ratio - Urinalysis, Routine w reflex microscopic  8. Adenoma of left adrenal gland Continue to monitor  - COMPLETE METABOLIC PANEL WITH GFR  9. Medication management All medications discussed and reviewed in full. All questions and concerns regarding medications addressed.    - CBC with Differential/Platelet - COMPLETE METABOLIC PANEL WITH GFR - Microalbumin / creatinine urine ratio - Urinalysis, Routine w reflex microscopic - Urine Culture    All medications were reviewed with patient and family and fully reconciled. All questions answered fully, and patient and family members were encouraged to call the office with any further questions or concerns. Discussed goal to avoid readmission related to this diagnosis.   Over 40 minutes of exam, counseling, chart review, and complex, high/moderate level critical decision making was performed this visit.   Future Appointments  Date Time Provider [place]  10/05/2022 11:30 AM Darrol Jump, NP [facility] None  12/10/2022 10:00 AM Unk Pinto, MD [facility] None     HPI 56 y.o.male presents for follow up for transition from  recent hospitalization or SNIF stay. Admit date to the hospital was 09/08/22, patient was discharged from the hospital on 09/10/22 and our clinical staff contacted the office the day after discharge to set up a follow up appointment. The discharge summary, medications, and diagnostic test results were reviewed before meeting with the patient. The patient was admitted for:   Patient is a 56 year old who initially presented to his primary care physician on 9/18 with complaint of right upper quadrant pain, headache, sore throat.  Ultrasound showed well circumcised anechoic lesion adjacent to the gallbladder and was recommended for MRI hepatic protocol with contrast.  He was prescribed ciprofloxacin on 9/18.  He had leukocytosis.  Further work-up after admission here showed 1.7 cm left adrenal nodule possibly adenoma, possible features of cystitis/pyelonephritis on the right, normal gallbladder and kidney.  UA also showed large leukocytes, bacteria. Started on Rocephin .  His abdominal pain has resolved, liver enzymes have been stable.  Urine culture showed pansensitive E. coli, blood cultures remain negative.  Hemodynamically stable for discharge with oral antibiotics.  Unclear etiology.  Outpatient  Ultrasound showed well circumcised anechoic lesion adjacent to the gallbladder and was recommended for MRI hepatic protocol with contrast.  CT abdomen/pelvis done on this admission showed normal gallbladder anatomy.   Liver enzymes stable.  He denies any right upper quadrant pain today.    He had MRI of liver completed 10/11/233 that revealed no suspicious hepatic lesion, specifically no lesion along the gallbladder fossa to correspond with findings on prior ultrasound.  He continues to have mild diffuse hepatic steatosis.  Also noted was a benign 11 mm left adrenal adenoma.   Recently underwent cystoscopy with left ureteroscopy with laser lithotripsy and left ureteral stent  placement due to impacted left UVJ stone  on 07/20/2022.  Patient was to follow-up again with urology approximately 1 month later for repeat renal ultrasound. Of note, patient has removed ureteral stent via tether as not seen on CT A/P performed on admission.  Prior 7 mm distal left ureteral calculus also no longer seen on repeat imaging on admission.  Dr. Murrell Converse  Home health is not involved.   Images while in the hospital: MR LIVER W WO CONTRAST  Result Date: 10/02/2022 CLINICAL DATA:  Further evaluation of possible gallbladder mass seen on ultrasound. EXAM: MRI ABDOMEN WITHOUT AND WITH CONTRAST TECHNIQUE: Multiplanar multisequence MR imaging of the abdomen was performed both before and after the administration of intravenous contrast. CONTRAST:  53m GADAVIST GADOBUTROL 1 MMOL/ML IV SOLN COMPARISON:  CT September 08, 2022 and ultrasound September 07, 2022. FINDINGS: Lower chest: No acute findings. Hepatobiliary: Minimal loss of signal on out of phase imaging throughout the liver consistent with hepatic steatosis. Scattered benign bilobar hepatic cysts. Gallbladder is minimally distended without intraluminal mass. No suspicious hepatic lesion, specifically no lesion along the gallbladder fossa to correspond with findings on prior ultrasound. No biliary ductal dilation. Pancreas: No pancreatic ductal dilation or evidence of acute inflammation. Spleen:  Surgically absent Adrenals/Urinary Tract: 11 mm left adrenal nodule demonstrates loss of signal on out of phase imaging consistent with a benign adrenal adenoma. No hydronephrosis. No suspicious renal mass. Stomach/Bowel: Visualized portions within the abdomen are unremarkable. Vascular/Lymphatic: Normal caliber abdominal aorta. No pathologically enlarged abdominal lymph nodes. Other: Thin walled well-circumscribed 12 mm left retrocrural fluid signal lesion does not demonstrate suspicious postcontrast enhancement and is consistent with a benign finding such as a lymphocele Musculoskeletal:  Multilevel degenerative changes spine. IMPRESSION: 1. No suspicious hepatic lesion, specifically no lesion along the gallbladder fossa to correspond with findings on prior ultrasound. 2. Mild diffuse hepatic steatosis. 3. Benign 11 mm left adrenal adenoma, no follow-up imaging recommended. Electronically Signed   By: JDahlia BailiffM.D.   On: 10/02/2022 11:33       Current Outpatient Medications (Respiratory):    diphenhydrAMINE (BENADRYL) 25 MG tablet, Take 25 mg by mouth every 6 (six) hours as needed.   guaiFENesin (MUCINEX) 600 MG 12 hr tablet, Take 600 mg by mouth 2 (two) times daily.   Current Outpatient Medications (Hematological):    cyanocobalamin 1000 MCG tablet, Take 1 tablet (1,000 mcg total) by mouth daily.  Current Outpatient Medications (Other):    diphenhydrAMINE HCl, Sleep, (ZZZQUIL PO), Take by mouth.  Past Medical History:  Diagnosis Date   GERD (gastroesophageal reflux disease)    History of kidney stones    Hypertension    Pt denies   Pre-diabetes      No Known Allergies  ROS: all negative except above.   Physical Exam: There were no vitals filed for this visit. There were no vitals taken for this visit. General Appearance: Well nourished, in no apparent distress. Eyes: PERRLA, EOMs, conjunctiva no swelling or erythema Sinuses: No Frontal/maxillary tenderness ENT/Mouth: Ext aud canals clear, TMs without erythema, bulging. No erythema, swelling, or exudate on post pharynx.  Tonsils not swollen or erythematous. Hearing normal.  Neck: Supple, thyroid normal.  Respiratory: Respiratory effort normal, BS equal bilaterally without rales, rhonchi, wheezing or stridor.  Cardio: RRR with no MRGs. Brisk peripheral pulses without edema.  Abdomen: Soft, + BS.  Non tender, no guarding, rebound, hernias, masses. Lymphatics: Non tender without lymphadenopathy.  Musculoskeletal: Full ROM, 5/5 strength, normal gait.  Skin: Warm, dry without rashes, lesions, ecchymosis.   Neuro: Cranial nerves intact. Normal muscle tone, no cerebellar symptoms. Sensation intact.  Psych: Awake and oriented X 3, normal affect, Insight and Judgment appropriate.     Darrol Jump, NP 9:06 AM [facility]

## 2022-10-06 LAB — URINALYSIS, ROUTINE W REFLEX MICROSCOPIC
Bacteria, UA: NONE SEEN /HPF
Bilirubin Urine: NEGATIVE
Glucose, UA: NEGATIVE
Hyaline Cast: NONE SEEN /LPF
Ketones, ur: NEGATIVE
Leukocytes,Ua: NEGATIVE
Nitrite: NEGATIVE
Protein, ur: NEGATIVE
RBC / HPF: NONE SEEN /HPF (ref 0–2)
Specific Gravity, Urine: 1.017 (ref 1.001–1.035)
Squamous Epithelial / HPF: NONE SEEN /HPF (ref <=5)
WBC, UA: NONE SEEN /HPF (ref 0–5)
pH: 6.5 (ref 5.0–8.0)

## 2022-10-06 LAB — COMPLETE METABOLIC PANEL WITH GFR
AG Ratio: 1.2 (calc) (ref 1.0–2.5)
ALT: 18 U/L (ref 9–46)
AST: 16 U/L (ref 10–35)
Albumin: 3.7 g/dL (ref 3.6–5.1)
Alkaline phosphatase (APISO): 98 U/L (ref 35–144)
BUN: 12 mg/dL (ref 7–25)
CO2: 30 mmol/L (ref 20–32)
Calcium: 9.1 mg/dL (ref 8.6–10.3)
Chloride: 105 mmol/L (ref 98–110)
Creat: 0.9 mg/dL (ref 0.70–1.30)
Globulin: 3.1 g/dL (calc) (ref 1.9–3.7)
Glucose, Bld: 83 mg/dL (ref 65–99)
Potassium: 4.1 mmol/L (ref 3.5–5.3)
Sodium: 141 mmol/L (ref 135–146)
Total Bilirubin: 0.5 mg/dL (ref 0.2–1.2)
Total Protein: 6.8 g/dL (ref 6.1–8.1)
eGFR: 100 mL/min/{1.73_m2} (ref >=60)

## 2022-10-06 LAB — URINE CULTURE
MICRO NUMBER:: 14056130
Result:: NO GROWTH
SPECIMEN QUALITY:: ADEQUATE

## 2022-10-06 LAB — CBC WITH DIFFERENTIAL/PLATELET
Absolute Monocytes: 1049 cells/uL — ABNORMAL HIGH (ref 200–950)
Basophils Absolute: 61 cells/uL (ref 0–200)
Basophils Relative: 0.8 %
Eosinophils Absolute: 540 cells/uL — ABNORMAL HIGH (ref 15–500)
Eosinophils Relative: 7.1 %
HCT: 45.5 % (ref 38.5–50.0)
Hemoglobin: 16.1 g/dL (ref 13.2–17.1)
Lymphs Abs: 1816 cells/uL (ref 850–3900)
MCH: 35.8 pg — ABNORMAL HIGH (ref 27.0–33.0)
MCHC: 35.4 g/dL (ref 32.0–36.0)
MCV: 101.1 fL — ABNORMAL HIGH (ref 80.0–100.0)
MPV: 11 fL (ref 7.5–12.5)
Monocytes Relative: 13.8 %
Neutro Abs: 4134 cells/uL (ref 1500–7800)
Neutrophils Relative %: 54.4 %
Platelets: 252 10*3/uL (ref 140–400)
RBC: 4.5 10*6/uL (ref 4.20–5.80)
RDW: 12.2 % (ref 11.0–15.0)
Total Lymphocyte: 23.9 %
WBC: 7.6 10*3/uL (ref 3.8–10.8)

## 2022-10-06 LAB — MICROSCOPIC MESSAGE

## 2022-10-06 LAB — MICROALBUMIN / CREATININE URINE RATIO
Creatinine, Urine: 146 mg/dL (ref 20–320)
Microalb Creat Ratio: 11 mcg/mg creat (ref <30)
Microalb, Ur: 1.6 mg/dL

## 2022-10-12 ENCOUNTER — Encounter: Payer: Self-pay | Admitting: Internal Medicine

## 2022-12-10 ENCOUNTER — Encounter: Payer: BC Managed Care – PPO | Admitting: Internal Medicine

## 2022-12-22 ENCOUNTER — Encounter: Payer: Self-pay | Admitting: Nurse Practitioner

## 2022-12-22 ENCOUNTER — Other Ambulatory Visit: Payer: Self-pay

## 2022-12-22 ENCOUNTER — Ambulatory Visit (INDEPENDENT_AMBULATORY_CARE_PROVIDER_SITE_OTHER): Payer: BC Managed Care – PPO | Admitting: Nurse Practitioner

## 2022-12-22 VITALS — BP 160/90 | HR 92 | Temp 96.3°F | Ht 74.0 in | Wt 264.2 lb

## 2022-12-22 DIAGNOSIS — I10 Essential (primary) hypertension: Secondary | ICD-10-CM

## 2022-12-22 DIAGNOSIS — R0781 Pleurodynia: Secondary | ICD-10-CM | POA: Diagnosis not present

## 2022-12-22 DIAGNOSIS — R062 Wheezing: Secondary | ICD-10-CM | POA: Diagnosis not present

## 2022-12-22 DIAGNOSIS — F17200 Nicotine dependence, unspecified, uncomplicated: Secondary | ICD-10-CM | POA: Diagnosis not present

## 2022-12-22 DIAGNOSIS — R6889 Other general symptoms and signs: Secondary | ICD-10-CM | POA: Diagnosis not present

## 2022-12-22 DIAGNOSIS — Z1152 Encounter for screening for COVID-19: Secondary | ICD-10-CM

## 2022-12-22 DIAGNOSIS — J439 Emphysema, unspecified: Secondary | ICD-10-CM

## 2022-12-22 DIAGNOSIS — R053 Chronic cough: Secondary | ICD-10-CM

## 2022-12-22 LAB — POC COVID19 BINAXNOW: SARS Coronavirus 2 Ag: NEGATIVE

## 2022-12-22 LAB — POCT INFLUENZA A/B
Influenza A, POC: NEGATIVE
Influenza B, POC: NEGATIVE

## 2022-12-22 MED ORDER — DEXAMETHASONE SODIUM PHOSPHATE 10 MG/ML IJ SOLN
10.0000 mg | Freq: Once | INTRAMUSCULAR | Status: AC
  Administered 2022-12-22: 10 mg via INTRAMUSCULAR

## 2022-12-22 MED ORDER — NAPROXEN 500 MG PO TABS: 500.0000 mg | ORAL_TABLET | Freq: Two times a day (BID) | ORAL | 0 refills | Status: AC

## 2022-12-22 NOTE — Progress Notes (Signed)
Assessment and Plan:  Erik Quinn was seen today for an episodic visit.  Diagnoses and all order for this visit:  Encounter for screening for COVID-19 Negative  - POC COVID-19  Flu-like symptoms Negative  - POCT Influenza A/B  Pulmonary emphysema, unspecified emphysema type (HCC)/Smoker Smoking cessation instruction/counseling given:  counseled patient on the dangers of tobacco use, advised patient to stop smoking, and reviewed strategies to maximize success  - dexamethasone (DECADRON) injection 10 mg  Wheezing Suggest CXR - patient declines. Risks and benefit of PTX, PNA discussed.   Continues to defer inhaler Suggested switching Benadryal to Zyrtec. Continue to monitor Report to ER or call 911 for any increase in difficulty breathing.  - dexamethasone (DECADRON) injection 10 mg  Chronic cough Continue to monitor  - dexamethasone (DECADRON) injection 10 mg  Rib pain on right side Start Naproxen. May alternate with Tylenol 1,000 mg TID Monitor for increase in pink frothy sputum, blood, brown phlegm.   Contact office if noticed  - naproxen (NAPROSYN) 500 MG tablet; Take 1 tablet (500 mg total) by mouth 2 (two) times daily with a meal for 7 days.  Dispense: 14 tablet; Refill: 0  Primary hypertension Purchase cuff and monitor in the home. Goal is <130/80 RTC in 2-3 weeks to discuss results.  Discussed DASH (Dietary Approaches to Stop Hypertension) DASH diet is lower in sodium than a typical American diet. Cut back on foods that are high in saturated fat, cholesterol, and trans fats. Eat more whole-grain foods, fish, poultry, and nuts Remain active and exercise as tolerated daily.  Report to ER for any increase in stroke like symptoms, including HA, N/V, paralysis, difficulty speaking, trouble walking, confusion, vision changes, CP, heart palpitations, SOB, diaphoresis.  Orders Placed This Encounter  Procedures   POC COVID-19    Order Specific Question:    Previously tested for COVID-19    Answer:   No    Order Specific Question:   Resident in a congregate (group) care setting    Answer:   No    Order Specific Question:   Employed in healthcare setting    Answer:   No   POCT Influenza A/B   Meds ordered this encounter  Medications   dexamethasone (DECADRON) injection 10 mg   naproxen (NAPROSYN) 500 MG tablet    Sig: Take 1 tablet (500 mg total) by mouth 2 (two) times daily with a meal for 7 days.    Dispense:  14 tablet    Refill:  0    Order Specific Question:   Supervising Provider    Answer:   Lucky Cowboy [phone]     Notify office for further evaluation and treatment, questions or concerns if s/s fail to improve. The risks and benefits of my recommendations, as well as other treatment options were discussed with the patient today. Questions were answered.   Notify office for further evaluation and treatment, questions or concerns if any reported s/s fail to improve.   The patient was advised to call back or seek an in-person evaluation if any symptoms worsen or if the condition fails to improve as anticipated.   Further disposition pending results of labs. Discussed med's effects and SE's.    I discussed the assessment and treatment plan with the patient. The patient was provided an opportunity to ask questions and all were answered. The patient agreed with the plan and demonstrated an understanding of the instructions.  Discussed med's effects and SE's. Screening labs and tests as requested  with regular follow-up as recommended.  I provided 20 minutes of face-to-face time during this encounter including counseling, chart review, and critical decision making was preformed.'  Future Appointments  Date Time Provider Department Center  01/14/2023 10:00 AM Williom Cedar, Archie Patten, NP [facility] None    ------------------------------------------------------------------------------------------------------------------   HPI BP (!)  160/90   Pulse 92   Temp (!) 96.3 F (35.7 C)   Ht 6\' 2"  (1.88 m)   Wt 264 lb 3.2 oz (119.8 kg)   SpO2 97%   BMI 33.92 kg/m    Patient complains of symptoms of a URI, possible sinusitis. Symptoms include congestion, cough described as productive, nasal congestion, shortness of breath, wheezing, and rib pain . Onset of symptoms was 1 week ago, and has been unchanged since that time. Treatment to date: nasal steroids.  He is a current every day smoker.  Feels as though cough cause him to crack a rib on his right lower side.  Has pain concentrated in that area with pain on inspiration.  He does not use inhalers as he feels as though they do not work well for him.   BP is elevated in clinic today.  He is not on BP medication.  Feels as though his BP is fairly well controlled most of the time.  Does not have BP machine at home.  Denies CP, SOB, difficulty breathing.  BP Readings from Last 3 Encounters:  12/22/22 (!) 160/90  10/05/22 (!) 126/90  09/10/22 (!) 154/99    Past Medical History:  Diagnosis Date   GERD (gastroesophageal reflux disease)    History of kidney stones    Hypertension    Pt denies   Pre-diabetes      No Known Allergies  Current Outpatient Medications on File Prior to Visit  Medication Sig   cyanocobalamin 1000 MCG tablet Take 1 tablet (1,000 mcg total) by mouth daily.   diphenhydrAMINE (BENADRYL) 25 MG tablet Take 25 mg by mouth every 6 (six) hours as needed.   diphenhydrAMINE HCl, Sleep, (ZZZQUIL PO) Take by mouth. (Patient not taking: Reported on 10/05/2022)   guaiFENesin (MUCINEX) 600 MG 12 hr tablet Take 600 mg by mouth 2 (two) times daily. (Patient not taking: Reported on 10/05/2022)   No current facility-administered medications on file prior to visit.    ROS: all negative except what is noted in the HPI.   Physical Exam:  BP (!) 160/90   Pulse 92   Temp (!) 96.3 F (35.7 C)   Ht 6\' 2"  (1.88 m)   Wt 264 lb 3.2 oz (119.8 kg)   SpO2 97%   BMI  33.92 kg/m   General Appearance: NAD.  Awake, conversant and cooperative. Eyes: PERRLA, EOMs intact.  Sclera white.  Conjunctiva without erythema. Sinuses: No frontal/maxillary tenderness.  No nasal discharge. Nares patent.  ENT/Mouth: Ext aud canals clear.  Bilateral TMs w/DOL and without erythema or bulging. Hearing intact.  Posterior pharynx without swelling or exudate.  Tonsils without swelling or erythema.  Neck: Supple.  No masses, nodules or thyromegaly. Respiratory: Pain with palpitation to right mid 12 th anterior rib area Effort is regular with non-labored breathing. Breath sounds are equal bilaterally with mild inspiratory and expiratory wheezing.  Cardio: RRR with no MRGs. Brisk peripheral pulses without edema.  Abdomen: Active BS in all four quadrants.  Soft and non-tender without guarding, rebound tenderness, hernias or masses. Lymphatics: Non tender without lymphadenopathy.  Musculoskeletal: Full ROM, 5/5 strength, normal ambulation.  No clubbing or cyanosis. Skin:  Appropriate color for ethnicity. Warm without rashes, lesions, ecchymosis, ulcers.  Neuro: CN II-XII grossly normal. Normal muscle tone without cerebellar symptoms and intact sensation.   Psych: AO X 3,  appropriate mood and affect, insight and judgment.     Adela Glimpse, NP 1:59 PM [facility]

## 2022-12-22 NOTE — Patient Instructions (Signed)
Bronchiectasis  Bronchiectasis is a condition in which the airways in the lungs (bronchi) are damaged and widened. The condition makes it hard for the lungs to get rid of mucus, and it causes mucus to gather in the bronchi. This condition often leads to lung infections, which can make the condition worse. What are the causes? You can be born with this condition, or you can develop it later in life. Common causes of this condition include: Cystic fibrosis. Repeated lung infections, such as pneumonia or tuberculosis. An object or other blockage in the lungs. Breathing in fluid, food, or other objects (aspiration). A problem with the body's defense system (immune system) and lung structure that is present at birth (congenital). Sometimes the cause is not known. What are the signs or symptoms? Common symptoms of this condition include: A daily cough that brings up mucus and lasts for more than 3 weeks. Lung infections that happen often. Shortness of breath and wheezing. Weakness and feeling tired (fatigue). How is this diagnosed? This condition is diagnosed with tests, such as: Chest X-rays or CT scans. These are done to check for changes in the lungs. Breathing tests. These are done to check how well your lungs are working. A test of a sample of your saliva (sputum culture). This test is done to check for infection. Blood tests and other tests. These are done to check for related diseases or causes. How is this treated? Treatment for this condition depends on the severity of the illness and its cause. Treatment may include: Medicines that loosen mucus so it can be coughed up (mucolytics). Medicines that relax the muscles of the bronchi (bronchodilators). Antibiotic medicines to prevent or treat infection. Physical therapy to help clear mucus from the lungs. Techniques may include: Postural drainage. This is when you sit or lie in certain positions so that mucus can drain by gravity. Chest  percussion. This involves tapping the chest or back with a cupped hand. Chest vibration. For this therapy, a hand or special equipment vibrates your chest and back. Surgery to remove the affected part of the lung. This may be done in severe cases. Follow these instructions at home: Medicines Take over-the-counter and prescription medicines only as told by your health care provider. If you were prescribed an antibiotic medicine, take it as told by your health care provider. Do not stop taking the antibiotic even if you start to feel better. Avoid taking sedatives and antihistamines unless your health care provider tells you to take them. These medicines tend to thicken the mucus in the lungs. Managing symptoms Do breathing exercises or techniques to clear your lungs as told by your health care provider. Consider using a cold steam vaporizer or humidifier in your room or home to help loosen secretions. If you have a cough that gets worse at night, try sleeping in a semi-upright position. General instructions Get plenty of rest. Drink enough fluid to keep your urine pale yellow. Stay inside when pollution and ozone levels are high. Stay up to date with vaccinations and immunizations. Avoid cigarette smoke and other lung irritants. Do not use any products that contain nicotine or tobacco. These products include cigarettes, chewing tobacco, and vaping devices, such as e-cigarettes. If you need help quitting, ask your health care provider. Keep all follow-up visits. This is important. Contact a health care provider if: You cough up more sputum than before and the sputum is yellow or green in color. You have a fever or chills. You cannot control your  cough and are losing sleep. Get help right away if: You cough up blood. You have chest pain. You have increasing shortness of breath. You have pain that gets worse or is not controlled with medicines. You have a fever and your symptoms suddenly get  worse. These symptoms may be an emergency. Get help right away. Call 911. Do not wait to see if the symptoms will go away. Do not drive yourself to the hospital. Summary Bronchiectasis is a condition in which the airways in the lungs (bronchi) are damaged and widened. The condition makes it hard for the lungs to get rid of mucus, and it causes mucus to gather in the bronchi. Treatment usually includes therapy to help clear mucus from the lungs. Avoid cigarette smoke and other lung irritants. Stay up to date with vaccinations and immunizations. This information is not intended to replace advice given to you by your health care provider. Make sure you discuss any questions you have with your health care provider. Document Revised: 08/20/2021 Document Reviewed: 07/08/2021 Elsevier Patient Education  [place].

## 2022-12-30 ENCOUNTER — Ambulatory Visit (INDEPENDENT_AMBULATORY_CARE_PROVIDER_SITE_OTHER): Payer: BC Managed Care – PPO | Admitting: Nurse Practitioner

## 2022-12-30 ENCOUNTER — Encounter: Payer: Self-pay | Admitting: Nurse Practitioner

## 2022-12-30 VITALS — BP 158/100 | HR 74 | Temp 97.5°F | Ht 74.0 in | Wt 269.0 lb

## 2022-12-30 DIAGNOSIS — R0781 Pleurodynia: Secondary | ICD-10-CM

## 2022-12-30 DIAGNOSIS — F419 Anxiety disorder, unspecified: Secondary | ICD-10-CM

## 2022-12-30 DIAGNOSIS — R198 Other specified symptoms and signs involving the digestive system and abdomen: Secondary | ICD-10-CM

## 2022-12-30 DIAGNOSIS — R58 Hemorrhage, not elsewhere classified: Secondary | ICD-10-CM | POA: Diagnosis not present

## 2022-12-30 DIAGNOSIS — R1011 Right upper quadrant pain: Secondary | ICD-10-CM | POA: Diagnosis not present

## 2022-12-30 DIAGNOSIS — R454 Irritability and anger: Secondary | ICD-10-CM

## 2022-12-30 DIAGNOSIS — I10 Essential (primary) hypertension: Secondary | ICD-10-CM

## 2022-12-30 MED ORDER — BUSPIRONE HCL 5 MG PO TABS: ORAL_TABLET | ORAL | 0 refills | Status: DC

## 2022-12-30 MED ORDER — LOSARTAN POTASSIUM 25 MG PO TABS: 25.0000 mg | ORAL_TABLET | Freq: Every day | ORAL | 0 refills | Status: DC

## 2022-12-30 NOTE — Progress Notes (Signed)
FOLLOW UP  Assessment and Plan:   RUQ pain  - CT CHEST ABDOMEN PELVIS W CONTRAST; Future - Urinalysis, Routine w reflex microscopic  Ecchymosis  - CT CHEST ABDOMEN PELVIS W CONTRAST; Future  Cullen's sign present  - CT CHEST ABDOMEN PELVIS W CONTRAST; Future  Rib pain on right side  - CT CHEST ABDOMEN PELVIS W CONTRAST; Future  Primary hypertension Uncontrolled - possible increase in elevation d/t pain. Start Losartan as directed Continue to keep BP Log Aim for goal of <130/90 Report to ER for any increase in stroke like symptoms, including HA, N/V, paralysis, difficulty speaking, trouble walking, confusion, vision changes, CP, heart palpitations, SOB, diaphoresis.  - CBC with Differential/Platelet - COMPLETE METABOLIC PANEL WITH GFR - losartan (COZAAR) 25 MG tablet; Take 1 tablet (25 mg total) by mouth daily.  Dispense: 30 tablet; Refill: 0  Anxiety Start Buspar. Reviewed relaxation techniques.  Sleep hygiene. Recommended Cognitive Behavioral Therapy (CBT). Recommended mindfulness meditation and exercise.   Encouraged personality growth wand development through coping techniques and problem-solving skills. Limit/Decrease/Monitor drug/alcohol intake.    - busPIRone (BUSPAR) 5 MG tablet; Take     1/2 to 1 tablet     3 x /day      for Anxiety  Dispense: 270 tablet; Refill: 0  Irritable Discussed possible need for adjunct therapy if Buspar no effective. Continue to monitor  - busPIRone (BUSPAR) 5 MG tablet; Take     1/2 to 1 tablet     3 x /day      for Anxiety  Dispense: 270 tablet; Refill: 0    Notify office for further evaluation and treatment, questions or concerns if any reported s/s fail to improve.   The patient was advised to call back or seek an in-person evaluation if any symptoms worsen or if the condition fails to improve as anticipated.   Further disposition pending results of labs. Discussed med's effects and SE's.    I discussed the assessment and  treatment plan with the patient. The patient was provided an opportunity to ask questions and all were answered. The patient agreed with the plan and demonstrated an understanding of the instructions.  Discussed med's effects and SE's. Screening labs and tests as requested with regular follow-up as recommended.  I provided 25 minutes of face-to-face time during this encounter including counseling, chart review, and critical decision making was preformed.   Future Appointments  Date Time Provider [place]  01/14/2023 10:00 AM Erik Jump, Erik Quinn [facility] None    ----------------------------------------------------------------------------------------------------------------------  HPI 57 y.o. male  presents for 3 month follow up on hypertension, cholesterol, diabetes, weight and vitamin D deficiency.   He was recently seen in office on 12/22/22 with complaints of right lower rib pain that he thought was from coughing too hard.  Felt as though he broke his lower ribs.  He declined x-ray at that time.  He was treated with Naproxen and Dexamethasone 10 mg injection x1.  He returns today with worsening pain and what appears to be Cullen's sign along his RLQ radiating to mid LLQ.  He describes a sensation of "something squeezing out in his gut and then pulling back in."  Review of his LFT's 09/2022 were WNL.  He denies BM changes, blood in stool, or urine.  Denies any recent fall, trauma, just the lingering cough. He is a current QD smoker.    He was also noted to have HTN during that time.  He was asked to purchase a BP  cuff and record BP readings.  Today he shares a log with highest BP reading 168/113.  Reports taking his wife's HCTZ for the last few days which has not been effective.  He denies CP, SOB, HA, vision changes.  He reports increase in anxiety and irritability from his current situation.  States that he wakes up mad.    Denies any dizziness, syncope.  He is not currently  taking any medications.    Of note, he was seen in ED 09/08/22 for acute pyelonephritis (hx of nephrolithiasis) for abdominal pain.  He presented to our office the day before 09/07/22 with c/o RUQ pain, HA and sore throat and prescribed Ciprofloxacin.  US showed well circumcised anechoic lesion adjacent to the gallbladder and was recommended for MRI hepatic protocol with contrast.  He was admitted and further work-up revealed a 1.7 cm left adrenal nodule possibly adenoma and/or feature of cystitis/pyelonephritis on the right.  He had normal gallbladder.  He was treated with Rocephin, abdominal pain resolved and LFTs were stable.  He had outpatient MRI liver w wo contrast on 10/11/233 which revealed no suspicious hepatic lesion, specifically no lesion along the gallbladder fossa to correspond with findings on prior ultrasound, mild diffuse hepatic steatosis, and a benign 11 mm left adrenal adenoma, no follow-up imaging recommended.  BMI is Body mass index is 34.54 kg/m., he has not been working on diet and exercise. Wt Readings from Last 3 Encounters:  12/30/22 269 lb (122 kg)  12/22/22 264 lb 3.2 oz (119.8 kg)  10/05/22 256 lb 12.8 oz (116.5 kg)   Current Medications:  Current Outpatient Medications on File Prior to Visit  Medication Sig   cyanocobalamin 1000 MCG tablet Take 1 tablet (1,000 mcg total) by mouth daily.   diphenhydrAMINE (BENADRYL) 25 MG tablet Take 25 mg by mouth every 6 (six) hours as needed. (Patient not taking: Reported on 12/30/2022)   diphenhydrAMINE HCl, Sleep, (ZZZQUIL PO) Take by mouth. (Patient not taking: Reported on 10/05/2022)   guaiFENesin (MUCINEX) 600 MG 12 hr tablet Take 600 mg by mouth 2 (two) times daily. (Patient not taking: Reported on 10/05/2022)   No current facility-administered medications on file prior to visit.     Allergies: No Known Allergies   Medical History:  Past Medical History:  Diagnosis Date   GERD (gastroesophageal reflux disease)     History of kidney stones    Hypertension    Pt denies   Pre-diabetes    Family history- Reviewed and unchanged Social history- Reviewed and unchanged   Review of Systems:  ROS    Physical Exam: BP (!) 158/100   Pulse 74   Temp (!) 97.5 F (36.4 C)   Ht '6\' 2"'$  (1.88 m)   Wt 269 lb (122 kg)   SpO2 96%   BMI 34.54 kg/m  Wt Readings from Last 3 Encounters:  12/30/22 269 lb (122 kg)  12/22/22 264 lb 3.2 oz (119.8 kg)  10/05/22 256 lb 12.8 oz (116.5 kg)   General Appearance: Well nourished, in no apparent distress. Eyes: PERRLA, EOMs, conjunctiva no swelling or erythema Sinuses: No Frontal/maxillary tenderness ENT/Mouth: Ext aud canals clear, TMs without erythema, bulging. No erythema, swelling, or exudate on post pharynx.  Tonsils not swollen or erythematous. Hearing normal.  Neck: Supple, thyroid normal.  Respiratory: Right lower chest with pain upon palpation to the 11-12th rib.  Ecchymosis.  Respiratory effort normal, equal bilaterally scattered expiratory wheezing.  Cardio: RRR with no MRGs. Brisk peripheral pulses without edema.  Abdomen: Soft, + BS.  Non tender, no guarding, rebound, hernias, masses. Lymphatics: Non tender without lymphadenopathy.  Musculoskeletal: Full ROM, 5/5 strength, Normal gait Skin: RLQ with ecchymosis that radiates across midline to LLQ.  Tender to palpation.   Neuro: Cranial nerves intact. No cerebellar symptoms.  Psych: Awake and oriented X 3, normal affect, Insight and Judgment appropriate.    Erik Jump, Erik Quinn 11:58 AM [facility]

## 2022-12-30 NOTE — Patient Instructions (Addendum)
You are pending approval to have a CT Scan of the abdomen, chest, and pelvis - this may be switched to an MRI.    Report to ER for any increase in nausea, vomiting, blood in stool, blood in urine, uncontrolled abdominal pain.    Abdominal Pain, Adult Many things can cause belly (abdominal) pain. Most times, belly pain is not dangerous. Many cases of belly pain can be watched and treated at home. Sometimes, though, belly pain is serious. Your doctor will try to find the cause of your belly pain. Follow these instructions at home:  Medicines Take over-the-counter and prescription medicines only as told by your doctor. Do not take medicines that help you poop (laxatives) unless told by your doctor. General instructions Watch your belly pain for any changes. Drink enough fluid to keep your pee (urine) pale yellow. Keep all follow-up visits as told by your doctor. This is important. Contact a doctor if: Your belly pain changes or gets worse. You are not hungry, or you lose weight without trying. You are having trouble pooping (constipated) or have watery poop (diarrhea) for more than 2-3 days. You have pain when you pee or poop. Your belly pain wakes you up at night. Your pain gets worse with meals, after eating, or with certain foods. You are vomiting and cannot keep anything down. You have a fever. You have blood in your pee. Get help right away if: Your pain does not go away as soon as your doctor says it should. You cannot stop vomiting. Your pain is only in areas of your belly, such as the right side or the left lower part of the belly. You have bloody or black poop, or poop that looks like tar. You have very bad pain, cramping, or bloating in your belly. You have signs of not having enough fluid or water in your body (dehydration), such as: Dark pee, very little pee, or no pee. Cracked lips. Dry mouth. Sunken eyes. Sleepiness. Weakness. You have trouble breathing or chest  pain. Summary Many cases of belly pain can be watched and treated at home. Watch your belly pain for any changes. Take over-the-counter and prescription medicines only as told by your doctor. Contact a doctor if your belly pain changes or gets worse. Get help right away if you have very bad pain, cramping, or bloating in your belly. This information is not intended to replace advice given to you by your health care provider. Make sure you discuss any questions you have with your health care provider. Document Revised: 04/17/2019 Document Reviewed: 04/17/2019 Elsevier Patient Education  [place].

## 2022-12-31 LAB — URINALYSIS, ROUTINE W REFLEX MICROSCOPIC
Bilirubin Urine: NEGATIVE
Glucose, UA: NEGATIVE
Hgb urine dipstick: NEGATIVE
Ketones, ur: NEGATIVE
Leukocytes,Ua: NEGATIVE
Nitrite: NEGATIVE
Protein, ur: NEGATIVE
Specific Gravity, Urine: 1.022 (ref 1.001–1.035)
pH: 6.5 (ref 5.0–8.0)

## 2022-12-31 LAB — COMPLETE METABOLIC PANEL WITH GFR
AG Ratio: 1.1 (calc) (ref 1.0–2.5)
ALT: 18 U/L (ref 9–46)
AST: 18 U/L (ref 10–35)
Albumin: 3.8 g/dL (ref 3.6–5.1)
Alkaline phosphatase (APISO): 81 U/L (ref 35–144)
BUN: 15 mg/dL (ref 7–25)
CO2: 29 mmol/L (ref 20–32)
Calcium: 9.6 mg/dL (ref 8.6–10.3)
Chloride: 104 mmol/L (ref 98–110)
Creat: 0.93 mg/dL (ref 0.70–1.30)
Globulin: 3.4 g/dL (calc) (ref 1.9–3.7)
Glucose, Bld: 86 mg/dL (ref 65–99)
Potassium: 4.4 mmol/L (ref 3.5–5.3)
Sodium: 143 mmol/L (ref 135–146)
Total Bilirubin: 0.5 mg/dL (ref 0.2–1.2)
Total Protein: 7.2 g/dL (ref 6.1–8.1)
eGFR: 96 mL/min/{1.73_m2} (ref >=60)

## 2022-12-31 LAB — CBC WITH DIFFERENTIAL/PLATELET
Absolute Monocytes: 1381 cells/uL — ABNORMAL HIGH (ref 200–950)
Basophils Absolute: 59 cells/uL (ref 0–200)
Basophils Relative: 0.5 %
Eosinophils Absolute: 35 cells/uL (ref 15–500)
Eosinophils Relative: 0.3 %
HCT: 47.7 % (ref 38.5–50.0)
Hemoglobin: 16.4 g/dL (ref 13.2–17.1)
Lymphs Abs: 2407 cells/uL (ref 850–3900)
MCH: 35.2 pg — ABNORMAL HIGH (ref 27.0–33.0)
MCHC: 34.4 g/dL (ref 32.0–36.0)
MCV: 102.4 fL — ABNORMAL HIGH (ref 80.0–100.0)
MPV: 10.4 fL (ref 7.5–12.5)
Monocytes Relative: 11.7 %
Neutro Abs: 7918 cells/uL — ABNORMAL HIGH (ref 1500–7800)
Neutrophils Relative %: 67.1 %
Platelets: 405 10*3/uL — ABNORMAL HIGH (ref 140–400)
RBC: 4.66 10*6/uL (ref 4.20–5.80)
RDW: 12 % (ref 11.0–15.0)
Total Lymphocyte: 20.4 %
WBC: 11.8 10*3/uL — ABNORMAL HIGH (ref 3.8–10.8)

## 2023-01-06 ENCOUNTER — Ambulatory Visit
Admission: RE | Admit: 2023-01-06 | Discharge: 2023-01-06 | Disposition: A | Discharge status: Home / Self Care | Payer: BC Managed Care – PPO | Source: Ambulatory Visit | Attending: Nurse Practitioner | Admitting: Nurse Practitioner

## 2023-01-06 DIAGNOSIS — R0781 Pleurodynia: Secondary | ICD-10-CM

## 2023-01-06 DIAGNOSIS — R58 Hemorrhage, not elsewhere classified: Secondary | ICD-10-CM

## 2023-01-06 DIAGNOSIS — R198 Other specified symptoms and signs involving the digestive system and abdomen: Secondary | ICD-10-CM

## 2023-01-06 DIAGNOSIS — R1011 Right upper quadrant pain: Secondary | ICD-10-CM

## 2023-01-06 MED ORDER — IOPAMIDOL (ISOVUE-300) INJECTION 61%
100.0000 mL | Freq: Once | INTRAVENOUS | Status: AC | PRN
  Administered 2023-01-06: 100 mL via INTRAVENOUS

## 2023-01-07 ENCOUNTER — Other Ambulatory Visit: Payer: Self-pay | Admitting: Nurse Practitioner

## 2023-01-07 ENCOUNTER — Telehealth: Payer: Self-pay

## 2023-01-07 DIAGNOSIS — R0781 Pleurodynia: Secondary | ICD-10-CM

## 2023-01-07 DIAGNOSIS — F17200 Nicotine dependence, unspecified, uncomplicated: Secondary | ICD-10-CM

## 2023-01-07 DIAGNOSIS — J439 Emphysema, unspecified: Secondary | ICD-10-CM

## 2023-01-07 NOTE — Telephone Encounter (Signed)
-----  Message from Darrol Jump, NP sent at 01/07/2023  8:52 AM EST ----- Regarding: CT Results Please let patient know that CT scan reveals no rib fractures.    He does have some fluid build up between the 8th and 9th ribs which they cannot determine where from.  Suggest possibly fluid or hemorrhage from prior trauma or an injury or tear of the inside muscle or ligament.  They are considering this an unusual finding and recommend further work up to ensure complete resolution.  Therefore, I feel it best for him to follow up with Pulmonology for further review and evaluation.

## 2023-01-07 NOTE — Telephone Encounter (Signed)
Results given. Patient is receptive to seeing Pulmonology. Please put in referral

## 2023-01-13 NOTE — Progress Notes (Unsigned)
Complete Physical  Assessment and Plan:  Encounter for general adult medical examination with abnormal findings Due annually Health maintenance reviewed   Primary hypertension Controlled Continue Losartan   - EKG 12-Lead  Prediabetes Education: Reviewed 'ABCs' of diabetes management  Discussed goals to be met and/or maintained include A1C (<7) Blood pressure (<130/80) Cholesterol (LDL <70) Continue Eye Exam yearly  Continue Dental Exam Q6 mo Discussed dietary recommendations Discussed Physical Activity recommendations Check A1C  - Hemoglobin A1c - Insulin, random  Pulmonary emphysema, unspecified emphysema type (HCC)/Right Rib Pain Pulmonary consultation scheduled 01/15/23 Dr. Melvyn Novas, [facility]  Smoker Smoking cessation instruction/counseling given:  counseled patient on the dangers of tobacco use, advised patient to stop smoking, and reviewed strategies to maximize success  - EKG 12-Lead  Anxiety/Irritable Continue Buspar.   Reviewed relaxation techniques.  Sleep hygiene. Recommended Cognitive Behavioral Therapy (CBT) - defers. Recommended mindfulness meditation and exercise.   Encouraged personality growth wand development through coping techniques and problem-solving skills. Limit/Decrease/Monitor drug/alcohol intake.    Vitamin D deficiency Continue supplement Monitor levels  - VITAMIN D 25 Hydroxy (Vit-D Deficiency, Fractures)  Screening cholesterol level Discussed lifestyle modifications. Recommended diet heavy in fruits and veggies, omega 3's. Decrease consumption of animal meats, cheeses, and dairy products. Remain active and exercise as tolerated. Continue to monitor. Check lipids/TSH  - Lipid panel  Medication management All medications discussed and reviewed in full. All questions and concerns regarding medications addressed.    - Magnesium - Lipid panel - TSH - Hemoglobin A1c - Insulin, random - VITAMIN D 25 Hydroxy (Vit-D  Deficiency, Fractures) - EKG 12-Lead - Urinalysis, Routine w reflex microscopic - Microalbumin / creatinine urine ratio - PSA - [place], retroperitnl abd,  ltd  BMI 35.0-35.9,adult Discussed appropriate BMI Goal of losing 1 lb per month. Diet modification. Physical activity. Encouraged/praised to build confidence.  Screening for prostate cancer  - PSA  Screening for AAA (abdominal aortic aneurysm)  - [place], retroperitnl abd,  ltd  Screening for hematuria or proteinuria  - Microalbumin / creatinine urine ratio  Screening for thyroid disorder  - TSH  Adenoma of left adrenal gland Continue to monitor  Nephrolithiasis Resolved - no recent stones. Continue to monitor Stay well hydrated to keep urinary system well flushed.    Screening for colon cancer Cologuard ordred  - Cologuard  Orders Placed This Encounter  Procedures   Magnesium   Lipid panel   TSH   Hemoglobin A1c   Insulin, random   VITAMIN D 25 Hydroxy (Vit-D Deficiency, Fractures)   Urinalysis, Routine w reflex microscopic   Microalbumin / creatinine urine ratio   PSA   Cologuard   [place], retroperitnl abd,  ltd   EKG 12-Lead     Notify office for further evaluation and treatment, questions or concerns if any reported s/s fail to improve.   The patient was advised to call back or seek an in-person evaluation if any symptoms worsen or if the condition fails to improve as anticipated.   Further disposition pending results of labs. Discussed med's effects and SE's.    I discussed the assessment and treatment plan with the patient. The patient was provided an opportunity to ask questions and all were answered. The patient agreed with the plan and demonstrated an understanding of the instructions.  Discussed med's effects and SE's. Screening labs and tests as requested with regular follow-up as recommended.  I provided 40 minutes of face-to-face time during this encounter including counseling, chart review,  and critical decision  making was preformed.   Future Appointments  Date Time Provider [place]  01/15/2023  3:00 PM Tanda Rockers, MD [id number] None  01/18/2024 10:00 AM Darrol Jump, NP [facility] None    HPI  57 y.o. male  presents for a complete physical. He has Vitamin D deficiency; Hypertension; Prediabetes; Medication management; Screening cholesterol level; Degenerative joint disease (DJD) of hip; BMI 35.0-35.9,adult; Pyelonephritis; Abdominal pain; Constipation; AKI (acute kidney injury) ([place]); Macrocytosis; and Headache on their problem list.  He was recently seen in office 01/07/23 for right sided rib rib pain associated RLQ ecchymosis. Thought to be brought on by coughing.  Had URI 12/22/2022.  Tested in office for Flu and Covid which were negative. Current every day smoker.   Completed CT Chest Abdomen Pelvis 01/06/23  that revealed  right lower lobe mild collapse/consolidation with small right pleural effusion.  There was no evidence for an acute rib fracture.   He was also noted to have a low-density/fluid-density between the anterior right eighth and ninth ribs with increased distance between the eighth and ninth ribs anteriorly which was of indeterminate etiology and suggest could be related to fluid/hemorrhage from prior trauma and injury/tear of interspinous muscular/ligamentous anatomy.   Due to his smoking history and current findings I felt as though a referral to Pulmonology was warranted.  He does not use inhalers, refused in the past stating they do not work for him.  He does continue to report a chronic cough, denies SOB, dizziness.    He had a hospital encounter 10/05/2022 for nephrolithiasis, hematuria, pyelonephritis which resolved.  Ultrasound showed well circumcised anechoic lesion adjacent to the gallbladder   He did note lower abdomina pain during this time.  Noted to have an 1.7 cm adenoma of left adrenal gland that is currently being monitored.He completed  an outpatient MRI revealed no suspicious hepatic lesion, specifically no lesion oalong the gallbladder fossa to correspond with findings on prior US.    He continues to follow with Urology, Dr. Abner Greenspan.  Underwent cystoscopy with left ureteroscopy with laser lithotripsy and left ureteral stent placement d/t impacted left UVJ stone on 07/20/22.  He had a one month f/u with urology for repeat renal ultrasound. Patient removed ureteral stent via tether as it was not seen on CT A/P performed on admission 09/2022.  Prior 7 mm distal left ureteral calculus also no longer seen on repeat imaging on admission.    BMI is Body mass index is 35.23 kg/m., he has not been working on diet and exercise. Wt Readings from Last 3 Encounters:  01/14/23 274 lb 6.4 oz (124.5 kg)  12/30/22 269 lb (122 kg)  12/22/22 264 lb 3.2 oz (119.8 kg)   His blood pressure has been controlled at home, today their BP is BP: 118/80 He does not workout. He denies chest pain, shortness of breath, dizziness.   He is not on cholesterol medication and denies myalgias. His cholesterol is at goal. The cholesterol last visit was:   Lab Results  Component Value Date   CHOL 149 12/03/2021   HDL 82 12/03/2021   LDLCALC 49 12/03/2021   TRIG 98 12/03/2021   CHOLHDL 1.8 12/03/2021    He has not been working on diet and exercise for DM is, he is not on bASA, he is on ACE/ARB and denies polydipsia and polyuria. Last A1C in the office was:  Lab Results  Component Value Date   HGBA1C 5.5 12/03/2021   Last GFR: Lab Results  Component Value  Date   EGFR 96 12/30/2022   Patient is on Vitamin D supplement.   Lab Results  Component Value Date   VD25OH 39 12/03/2021     Last PSA was: Lab Results  Component Value Date   PSA 0.37 12/03/2021     Current Medications:  Current Outpatient Medications on File Prior to Visit  Medication Sig Dispense Refill   busPIRone (BUSPAR) 5 MG tablet Take     1/2 to 1 tablet     3 x /day      for Anxiety  270 tablet 0   cyanocobalamin 1000 MCG tablet Take 1 tablet (1,000 mcg total) by mouth daily. 100 tablet 0   diphenhydrAMINE (BENADRYL) 25 MG tablet Take 25 mg by mouth every 6 (six) hours as needed. (Patient not taking: Reported on 12/30/2022)     diphenhydrAMINE HCl, Sleep, (ZZZQUIL PO) Take by mouth. (Patient not taking: Reported on 10/05/2022)     guaiFENesin (MUCINEX) 600 MG 12 hr tablet Take 600 mg by mouth 2 (two) times daily. (Patient not taking: Reported on 10/05/2022)     losartan (COZAAR) 25 MG tablet Take 1 tablet (25 mg total) by mouth daily. 30 tablet 0   No current facility-administered medications on file prior to visit.   Allergies:  Not on File Medical History:  He has Vitamin D deficiency; Hypertension; Prediabetes; Medication management; Screening cholesterol level; Degenerative joint disease (DJD) of hip; BMI 35.0-35.9,adult; Pyelonephritis; Abdominal pain; Constipation; AKI (acute kidney injury) ([place]); Macrocytosis; and Headache on their problem list.  Health Maintenance:   Immunization History  Administered Date(s) Administered   Influenza Split 10/24/2014   Influenza,inj,Quad PF,6+ Mos 09/24/2021, 09/09/2022   Influenza-Unspecified 09/20/2018, 09/23/2020   PFIZER(Purple Top)SARS-COV-2 Vaccination 03/21/2020, 04/11/2020, 11/23/2020   PNEUMOCOCCAL CONJUGATE-20 09/09/2022   PPD Test 10/24/2014, 12/06/2018, 12/03/2021   Pfizer Covid-19 Vaccine Bivalent Booster 61yr & up 10/29/2021   Pneumococcal Conjugate-13 10/24/2014   Pneumococcal-Unspecified 10/20/2013   Tdap 10/20/2013   Zoster Recombinat (Shingrix) 09/24/2021, 04/13/2022   Health Maintenance  Topic Date Due   COVID-19 Vaccine (5 - 2023-24 season) 08/21/2022   DTaP/Tdap/Td (2 - Td or Tdap) 10/21/2023   Lung Cancer Screening  01/07/2024   COLONOSCOPY (Pts 45-451yrInsurance coverage will need to be confirmed)  08/25/2026   INFLUENZA VACCINE  Completed   Hepatitis C Screening  Completed   HIV Screening   Completed   Zoster Vaccines- Shingrix  Completed   HPV VACCINES  Aged Out    Colonoscopy: Unknown - Cologuard ordered EGD: None  Last Dental Exam: Dentist Q6 mo Last Eye Exam: Defers  Last Derm Exam: No concerns - defers   Patient Care Team: McUnk PintoMD as PCP - General (Internal Medicine)  Surgical History:  He has a past surgical history that includes Splenectomy (1994); Total hip arthroplasty; Knee arthroscopy with lateral menisectomy (Left, 09/01/2017); Arm surgery (Left); and Cystoscopy/ureteroscopy/holmium laser/stent placement (Left, 07/20/2022). Family History:  Hisfamily history includes Diabetes in his mother; Sleep apnea in his mother. Social History:  He reports that he has been smoking cigarettes. He has a 20.00 pack-year smoking history. He has never used smokeless tobacco. He reports current alcohol use of about 8.0 standard drinks of alcohol per week. He reports that he does not use drugs.  Review of Systems: Review of Systems  Constitutional: Negative.   HENT: Negative.    Eyes: Negative.   Respiratory:  Positive for cough.   Cardiovascular: Negative.   Gastrointestinal: Negative.   Genitourinary: Negative.  Musculoskeletal: Negative.   Skin: Negative.   Neurological: Negative.   Endo/Heme/Allergies: Negative.   Psychiatric/Behavioral:  The patient is nervous/anxious.     Physical Exam: Estimated body mass index is 35.23 kg/m as calculated from the following:   Height as of this encounter: '6\' 2"'$  (1.88 m).   Weight as of this encounter: 274 lb 6.4 oz (124.5 kg). BP 118/80   Pulse 74   Temp (!) 97.3 F (36.3 C)   Ht '6\' 2"'$  (1.88 m)   Wt 274 lb 6.4 oz (124.5 kg)   SpO2 96%   BMI 35.23 kg/m   General Appearance: Well nourished, obese, in no apparent distress.  Eyes: PERRLA, EOMs, conjunctiva no swelling or erythema, normal fundi and vessels.  Sinuses: No Frontal/maxillary tenderness  ENT/Mouth: Ext aud canals clear, normal light reflex with  TMs without erythema, bulging. Good dentition. No erythema, swelling, or exudate on post pharynx. Tonsils not swollen or erythematous. Hearing normal.  Neck: Supple, thyroid normal. No bruits  Respiratory: Respiratory effort normal, BS equal bilaterally without rales, rhonchi, wheezing or stridor.  Cardio: RRR without murmurs, rubs or gallops. Brisk peripheral pulses without edema.  Chest: symmetric, with normal excursions and percussion.  Abdomen: Soft, nontender, no guarding, rebound, hernias, masses, or organomegaly.  Lymphatics: Non tender without lymphadenopathy.  Musculoskeletal: Full ROM all peripheral extremities,5/5 strength, and normal gait.  Skin: Warm, dry without rashes, lesions, ecchymosis. Neuro: Cranial nerves intact, reflexes equal bilaterally. Normal muscle tone, no cerebellar symptoms. Sensation intact.  Psych: Awake and oriented X 3, normal affect, Insight and Judgment appropriate.    EKG: WNL no ST changes. AORTA SCAN: WNL   Darrol Jump, NP 11:05 AM [facility]

## 2023-01-14 ENCOUNTER — Encounter: Payer: BC Managed Care – PPO | Admitting: Internal Medicine

## 2023-01-14 ENCOUNTER — Ambulatory Visit (INDEPENDENT_AMBULATORY_CARE_PROVIDER_SITE_OTHER): Payer: BC Managed Care – PPO | Admitting: Nurse Practitioner

## 2023-01-14 ENCOUNTER — Encounter: Payer: Self-pay | Admitting: Nurse Practitioner

## 2023-01-14 VITALS — BP 118/80 | HR 74 | Temp 97.3°F | Ht 74.0 in | Wt 274.4 lb

## 2023-01-14 DIAGNOSIS — N200 Calculus of kidney: Secondary | ICD-10-CM

## 2023-01-14 DIAGNOSIS — E559 Vitamin D deficiency, unspecified: Secondary | ICD-10-CM | POA: Diagnosis not present

## 2023-01-14 DIAGNOSIS — I10 Essential (primary) hypertension: Secondary | ICD-10-CM

## 2023-01-14 DIAGNOSIS — Z0001 Encounter for general adult medical examination with abnormal findings: Secondary | ICD-10-CM

## 2023-01-14 DIAGNOSIS — I700 Atherosclerosis of aorta: Secondary | ICD-10-CM

## 2023-01-14 DIAGNOSIS — Z136 Encounter for screening for cardiovascular disorders: Secondary | ICD-10-CM

## 2023-01-14 DIAGNOSIS — Z1389 Encounter for screening for other disorder: Secondary | ICD-10-CM

## 2023-01-14 DIAGNOSIS — R350 Frequency of micturition: Secondary | ICD-10-CM | POA: Diagnosis not present

## 2023-01-14 DIAGNOSIS — F419 Anxiety disorder, unspecified: Secondary | ICD-10-CM

## 2023-01-14 DIAGNOSIS — Z125 Encounter for screening for malignant neoplasm of prostate: Secondary | ICD-10-CM | POA: Diagnosis not present

## 2023-01-14 DIAGNOSIS — F17200 Nicotine dependence, unspecified, uncomplicated: Secondary | ICD-10-CM

## 2023-01-14 DIAGNOSIS — Z13220 Encounter for screening for lipoid disorders: Secondary | ICD-10-CM

## 2023-01-14 DIAGNOSIS — Z79899 Other long term (current) drug therapy: Secondary | ICD-10-CM | POA: Diagnosis not present

## 2023-01-14 DIAGNOSIS — J439 Emphysema, unspecified: Secondary | ICD-10-CM

## 2023-01-14 DIAGNOSIS — Z0000 Encounter for general adult medical examination without abnormal findings: Secondary | ICD-10-CM

## 2023-01-14 DIAGNOSIS — R454 Irritability and anger: Secondary | ICD-10-CM

## 2023-01-14 DIAGNOSIS — Z1329 Encounter for screening for other suspected endocrine disorder: Secondary | ICD-10-CM

## 2023-01-14 DIAGNOSIS — R0781 Pleurodynia: Secondary | ICD-10-CM

## 2023-01-14 DIAGNOSIS — N401 Enlarged prostate with lower urinary tract symptoms: Secondary | ICD-10-CM | POA: Diagnosis not present

## 2023-01-14 DIAGNOSIS — Z6835 Body mass index (BMI) 35.0-35.9, adult: Secondary | ICD-10-CM

## 2023-01-14 DIAGNOSIS — R7303 Prediabetes: Secondary | ICD-10-CM

## 2023-01-14 DIAGNOSIS — Z131 Encounter for screening for diabetes mellitus: Secondary | ICD-10-CM

## 2023-01-14 DIAGNOSIS — Z1211 Encounter for screening for malignant neoplasm of colon: Secondary | ICD-10-CM

## 2023-01-14 DIAGNOSIS — D3502 Benign neoplasm of left adrenal gland: Secondary | ICD-10-CM

## 2023-01-14 NOTE — Progress Notes (Unsigned)
Erik Quinn, male    DOB: 1966-02-19   MRN: [id number]   Brief patient profile:  10  yowm  active smoker/retired furniture deliveryman  referred to pulmonary clinic in [place] [facility]  01/15/2023 by Dr Melford Aase   for chronic cough/rattle x around 2000  much worse since 3 days prior [place] assoc with new R CP and abnormal  CT  chest 1/17 24 c/w R effusion and some atx RLL       History of Present Illness  01/15/2023  Pulmonary/ 1st office eval/ Erik Quinn / [facility]  Chief Complaint  Patient presents with   Consult    Pain in his side when he coughs or sneezes. Going since the end of December.   Dyspnea:  Not limited by breathing from desired activities   Cough: non-productive tickle in throat x years Sleep: on  L side down/ flat bed 2 pillows  SABA use:  Cp much worse around [place]'s and eased off x one week prior to OV s specific rx / purplish bruise over R ant/lat chest resolved as well / no obvious rib fix on CT  Hurts to lift anything heavy   No obvious day to day or daytime pattern/variability or assoc excess/ purulent sputum or mucus plugs or hemoptysis or chest tightness, subjective wheeze or overt sinus or hb symptoms.   Sleeping  without nocturnal  or early am exacerbation  of respiratory  c/o's or need for noct saba. Also denies any obvious fluctuation of symptoms with weather or environmental changes or other aggravating or alleviating factors except as outlined above   No unusual exposure hx or h/o childhood pna/ asthma or knowledge of premature birth.  Current Allergies, Complete Past Medical History, Past Surgical History, Family History, and Social History were reviewed in [employer] record.  ROS  The following are not active complaints unless bolded Hoarseness, sore throat, dysphagia, dental problems, itching, sneezing,  nasal congestion or discharge of excess mucus or purulent secretions, ear ache,   fever, chills, sweats, unintended wt loss or wt  gain, classically  exertional cp,  orthopnea pnd or arm/hand swelling  or leg swelling, presyncope, palpitations, abdominal pain, anorexia, nausea, vomiting, diarrhea  or change in bowel habits or change in bladder habits, change in stools or change in urine, dysuria, hematuria,  rash, arthralgias, visual complaints, headache, numbness, weakness or ataxia or problems with walking or coordination,  change in mood or  memory.           Past Medical History:  Diagnosis Date   GERD (gastroesophageal reflux disease)    History of kidney stones    Hypertension    Pt denies   Pre-diabetes     Outpatient Medications Prior to Visit  Medication Sig Dispense Refill   busPIRone (BUSPAR) 5 MG tablet Take     1/2 to 1 tablet     3 x /day      for Anxiety 270 tablet 0   cyanocobalamin 1000 MCG tablet Take 1 tablet (1,000 mcg total) by mouth daily. 100 tablet 0   diphenhydrAMINE (BENADRYL) 25 MG tablet Take 25 mg by mouth every 6 (six) hours as needed. (Patient not taking: Reported on 12/30/2022)     diphenhydrAMINE HCl, Sleep, (ZZZQUIL PO) Take by mouth. (Patient not taking: Reported on 10/05/2022)     guaiFENesin (MUCINEX) 600 MG 12 hr tablet Take 600 mg by mouth 2 (two) times daily. (Patient not taking: Reported on 10/05/2022)  losartan (COZAAR) 25 MG tablet Take 1 tablet (25 mg total) by mouth daily. 30 tablet 0   No facility-administered medications prior to visit.     Objective:     BP 118/80 (BP Location: Left Arm, Cuff Size: Normal)   Pulse 80   Temp 98.5 F (36.9 C)   Ht '6\' 2"'$  (1.88 m)   Wt 272 lb 3.2 oz (123.5 kg)   SpO2 96%   BMI 34.95 kg/m   SpO2: 96 %  no top teeth/ bottom ok  Stoic amb wm/ smoker's rattle   HEENT : Oropharynx  clear/ no top teeth/ lower ok         NECK :  without  apparent JVD/ palpable Nodes/TM    LUNGS: no acc muscle use,  Nl contour chest with decreased bs/ dullness R base/ mild sts lateral cw around C8    CV:  RRR  no s3 or murmur or increase  in P2, and no edema   ABD:  soft and nontender  MS:  Nl gait/ ext warm without deformities Or obvious joint restrictions  calf tenderness, cyanosis or clubbing    SKIN: warm and dry without lesions    NEURO:  alert, approp, nl sensorium with  no motor or cerebellar deficits apparent.  / min insp/exp rhonchi     I personally reviewed images and agree with radiology impression as follows:   Chest CT with contrast    01/06/23 1. Right lower lobe mild collapse/consolidation with small right pleural effusion. 2. No evidence for an acute rib fracture. Patient is noted to have low-density/fluid-density between the anterior right eighth and ninth ribs with increased distance between the eighth and ninth ribs anteriorly. This is of indeterminate etiology and may be related to fluid/hemorrhage from prior trauma and injury/tear of interspinous muscular/ligamentous anatomy. As this is a somewhat unusual finding, follow-up is recommended to ensure complete resolution and to exclude an underlying thoracic wall lesion. 3. No acute findings in the abdomen or pelvis.     CXR PA and Lateral:   01/15/2023 :    I personally reviewed images and impression is as follows:     Small R Pl effusion     Lab Results  Component Value Date   ESRSEDRATE 24 (H) 01/15/2023   ESRSEDRATE 2 10/31/2020       Lab Results  Component Value Date   HGB 14.7 01/15/2023   HGB 16.4 12/30/2022   HGB 16.1 10/05/2022       Assessment   Pleural effusion on right Onset around [place]'s day 2024 assoc with bruising R chest wall but no obvious rib fx - see CT chest 01/06/23   Most likely he tore an intercostal muscle from coughing or sneezing resulting in a R hemothorax and ecchymosis of the R lat chest wall with risk also of PICS (which is an inflammatory pleural reaction to chest wall injury)  - other explanations like pna/ parapneumonic effusion are possible but I assured him malignancy (which is statistically a concern  in smoker) is not likely at all here but needs to be excluded as well.  F/u in 2 weeks with another cxr and consider Tap if not resolving - rec call sooner if condition worsens.    Chronic bronchitis ([place]) Active smoker   - 01/15/2023  After extensive coaching inhaler device,  effectiveness = 85% > breztri Take 2 puffs first thing in am and then another 2 puffs about 12 hours later. X 2 weeks samples  then regroup -   Eos  01/15/2023  =  0.8  I suspect he downplays the severity and duration of his cough which is likely directly related to smoking and probably doesn't really need breztri at this point based on exam/ hx but it may help some with mucociliary clearance and improve the cough and if not then will d/c at f/u in 2 weeks .  In meantime can also use mucinex dm 1200 mg bid prn     Cigarette smoker Counseled re importance of smoking cessation but did not meet time criteria for separate billing     Each maintenance medication was reviewed in detail including emphasizing most importantly the difference between maintenance and prns and under what circumstances the prns are to be triggered using an action plan format where appropriate.  Total time for H and P, chart review, counseling, reviewing hfa  device(s) and generating customized AVS unique to this office visit / same day charting  > 45 min pt new to me          Christinia Gully, MD 01/15/2023

## 2023-01-14 NOTE — Patient Instructions (Addendum)
Dr. Christinia Gully [place], [place], [place], [id number] [phone]  Smoking Tobacco Information, Adult Smoking tobacco can be harmful to your health. Tobacco contains a toxic colorless chemical called nicotine. Nicotine causes changes in your brain that make you want more and more. This is called addiction. This can make it hard to stop smoking once you start. Tobacco also has other toxic chemicals that can hurt your body and raise your risk of many cancers. Menthol or "lite" tobacco or cigarette brands are not safer than regular brands. How can smoking tobacco affect me? Smoking tobacco puts you at risk for: Cancer. Smoking is most commonly associated with lung cancer, but can also lead to cancer in other parts of the body. Chronic obstructive pulmonary disease (COPD). This is a long-term lung condition that makes it hard to breathe. It also gets worse over time. High blood pressure (hypertension), heart disease, stroke, heart attack, and lung infections, such as pneumonia. Cataracts. This is when the lenses in the eyes become clouded. Digestive problems. This may include peptic ulcers, heartburn, and gastroesophageal reflux disease (GERD). Oral health problems, such as gum disease, mouth sores, and tooth loss. Loss of taste and smell. Smoking also affects how you look and smell. Smoking may cause: Wrinkles. Yellow or stained teeth, fingers, and fingernails. Bad breath. Bad-smelling clothes and hair. Smoking tobacco can also affect your social life, because: It may be challenging to find places to smoke when away from home. Many workplaces, [employer], hotels, and public places are tobacco-free. Smoking is expensive. This is due to the cost of tobacco and the long-term costs of treating health problems from smoking. Secondhand smoke may affect those around you. Secondhand smoke can cause lung cancer, breathing problems, and heart disease. Children of smokers have a higher risk  for: Sudden infant death syndrome (SIDS). Ear infections. Lung infections. What actions can I take to prevent health problems? Quit smoking  Do not start smoking. Quit if you already smoke. Do not replace cigarette smoking with vaping devices, such as e-cigarettes. Make a plan to quit smoking and commit to it. Look for programs to help you, and ask your health care provider for recommendations and ideas. Set a date and write down all the reasons you want to quit. Let your friends and family know you are quitting so they can help and support you. Consider finding friends who also want to quit. It can be easier to quit with someone else, so that you can support each other. Talk with your health care provider about using nicotine replacement medicines to help you quit. These include gum, lozenges, patches, sprays, or pills. If you try to quit but return to smoking, stay positive. It is common to slip up when you first quit, so take it one day at a time. Be prepared for cravings. When you feel the urge to smoke, chew gum or suck on hard candy. Lifestyle Stay busy. Take care of your body. Get plenty of exercise, eat a healthy diet, and drink plenty of water. Find ways to manage your stress, such as meditation, yoga, exercise, or time spent with friends and family. Ask your health care provider about having regular tests (screenings) to check for cancer. This may include blood tests, imaging tests, and other tests. Where to find support To get support to quit smoking, consider: Asking your health care provider for more information and resources. Joining a support group for people who want to quit smoking in your local community. There are  many effective programs that may help you to quit. Calling the smokefree.gov counselor helpline at 1-800-QUIT-NOW 931-004-6953). Where to find more information You may find more information about quitting smoking from: Centers for Disease Control and  Prevention: https://www.chang-huffman.com/ https://hall.com/: smokefree.gov American Lung Association: freedomfromsmoking.org Contact a health care provider if: You have problems breathing. Your lips, nose, or fingers turn blue. You have chest pain. You are coughing up blood. You feel like you will faint. You have other health changes that cause you to worry. Summary Smoking tobacco can negatively affect your health, the health of those around you, your finances, and your social life. Do not start smoking. Quit if you already smoke. If you need help quitting, ask your health care provider. Consider joining a support group for people in your local community who want to quit smoking. There are many effective programs that may help you to quit. This information is not intended to replace advice given to you by your health care provider. Make sure you discuss any questions you have with your health care provider. Document Revised: 12/02/2021 Document Reviewed: 12/02/2021 Elsevier Patient Education  [place] Following a healthy eating pattern may help you to achieve and maintain a healthy body weight, reduce the risk of chronic disease, and live a long and productive life. It is important to follow a healthy eating pattern at an appropriate calorie level for your body. Your nutritional needs should be met primarily through food by choosing a variety of nutrient-rich foods. What are tips for following this plan? Reading food labels Read labels and choose the following: Reduced or low sodium. Juices with 100% fruit juice. Foods with low saturated fats and high polyunsaturated and monounsaturated fats. Foods with whole grains, such as whole wheat, cracked wheat, brown rice, and wild rice. Whole grains that are fortified with folic acid. This is recommended for women who are pregnant or who want to become pregnant. Read labels and avoid the following: Foods with a lot of added sugars.  These include foods that contain brown sugar, corn sweetener, corn syrup, dextrose, fructose, glucose, high-fructose corn syrup, honey, invert sugar, lactose, malt syrup, maltose, molasses, raw sugar, sucrose, trehalose, or turbinado sugar. Do not eat more than the following amounts of added sugar per day: 6 teaspoons (25 g) for women. 9 teaspoons (38 g) for men. Foods that contain processed or refined starches and grains. Refined grain products, such as white flour, degermed cornmeal, white bread, and white rice. Shopping Choose nutrient-rich snacks, such as vegetables, whole fruits, and nuts. Avoid high-calorie and high-sugar snacks, such as potato chips, fruit snacks, and candy. Use oil-based dressings and spreads on foods instead of solid fats such as butter, stick margarine, or cream cheese. Limit pre-made sauces, mixes, and "instant" products such as flavored rice, instant noodles, and ready-made pasta. Try more plant-protein sources, such as tofu, tempeh, black beans, edamame, lentils, nuts, and seeds. Explore eating plans such as the Mediterranean diet or vegetarian diet. Cooking Use oil to saut or stir-fry foods instead of solid fats such as butter, stick margarine, or lard. Try baking, boiling, grilling, or broiling instead of frying. Remove the fatty part of meats before cooking. Steam vegetables in water or broth. Meal planning  At meals, imagine dividing your plate into fourths: One-half of your plate is fruits and vegetables. One-fourth of your plate is whole grains. One-fourth of your plate is protein, especially lean meats, poultry, eggs, tofu, beans, or nuts. Include low-fat dairy  as part of your daily diet. Lifestyle Choose healthy options in all settings, including home, work, school, restaurants, or stores. Prepare your food safely: Wash your hands after handling raw meats. Keep food preparation surfaces clean by regularly washing with hot, soapy water. Keep raw  meats separate from ready-to-eat foods, such as fruits and vegetables. Cook seafood, meat, poultry, and eggs to the recommended internal temperature. Store foods at safe temperatures. In general: Keep cold foods at 29F (4.4C) or below. Keep hot foods at 129F (60C) or above. Keep your freezer at [facility] (-17.8C) or below. Foods are no longer safe to eat when they have been between the temperatures of 40-129F (4.4-60C) for more than 2 hours. What foods should I eat? Fruits Aim to eat 2 cup-equivalents of fresh, canned (in natural juice), or frozen fruits each day. Examples of 1 cup-equivalent of fruit include 1 small apple, 8 large strawberries, 1 cup canned fruit,  cup dried fruit, or 1 cup 100% juice. Vegetables Aim to eat 2-3 cup-equivalents of fresh and frozen vegetables each day, including different varieties and colors. Examples of 1 cup-equivalent of vegetables include 2 medium carrots, 2 cups raw, leafy greens, 1 cup chopped vegetable (raw or cooked), or 1 medium baked potato. Grains Aim to eat 6 ounce-equivalents of whole grains each day. Examples of 1 ounce-equivalent of grains include 1 slice of bread, 1 cup ready-to-eat cereal, 3 cups popcorn, or  cup cooked rice, pasta, or cereal. Meats and other proteins Aim to eat 5-6 ounce-equivalents of protein each day. Examples of 1 ounce-equivalent of protein include 1 egg, 1/2 cup nuts or seeds, or 1 tablespoon (16 g) peanut butter. A cut of meat or fish that is the size of a deck of cards is about 3-4 ounce-equivalents. Of the protein you eat each week, try to have at least 8 ounces come from seafood. This includes salmon, trout, herring, and anchovies. Dairy Aim to eat 3 cup-equivalents of fat-free or low-fat dairy each day. Examples of 1 cup-equivalent of dairy include 1 cup (240 mL) milk, 8 ounces (250 g) yogurt, 1 ounces (44 g) natural cheese, or 1 cup (240 mL) fortified soy milk. Fats and oils Aim for about 5 teaspoons (21 g) per  day. Choose monounsaturated fats, such as canola and olive oils, avocados, peanut butter, and most nuts, or polyunsaturated fats, such as sunflower, corn, and soybean oils, walnuts, pine nuts, sesame seeds, sunflower seeds, and flaxseed. Beverages Aim for six 8-oz glasses of water per day. Limit coffee to three to five 8-oz cups per day. Limit caffeinated beverages that have added calories, such as soda and energy drinks. Limit alcohol intake to no more than 1 drink a day for nonpregnant women and 2 drinks a day for men. One drink equals 12 oz of beer (355 mL), 5 oz of wine (148 mL), or 1 oz of hard liquor (44 mL). Seasoning and other foods Avoid adding excess amounts of salt to your foods. Try flavoring foods with herbs and spices instead of salt. Avoid adding sugar to foods. Try using oil-based dressings, sauces, and spreads instead of solid fats. This information is based on general [place].S[place] nutrition guidelines. For more information, visit BuildDNA.es. Exact amounts may vary based on your nutrition needs. Summary A healthy eating plan may help you to maintain a healthy weight, reduce the risk of chronic diseases, and stay active throughout your life. Plan your meals. Make sure you eat the right portions of a variety of nutrient-rich foods. Try baking,  boiling, grilling, or broiling instead of frying. Choose healthy options in all settings, including home, work, school, restaurants, or stores. This information is not intended to replace advice given to you by your health care provider. Make sure you discuss any questions you have with your health care provider. Document Revised: 05/20/2022 Document Reviewed: 08/05/2021 Elsevier Patient Education  [place].

## 2023-01-15 ENCOUNTER — Ambulatory Visit
Admission: RE | Admit: 2023-01-15 | Discharge: 2023-01-15 | Disposition: A | Discharge status: Home / Self Care | Payer: BC Managed Care – PPO | Source: Ambulatory Visit | Attending: Internal Medicine | Admitting: Internal Medicine

## 2023-01-15 ENCOUNTER — Other Ambulatory Visit
Admission: RE | Admit: 2023-01-15 | Discharge: 2023-01-15 | Disposition: A | Discharge status: Home / Self Care | Payer: BC Managed Care – PPO | Source: Ambulatory Visit | Attending: Internal Medicine | Admitting: Internal Medicine

## 2023-01-15 ENCOUNTER — Encounter: Payer: Self-pay | Admitting: Internal Medicine

## 2023-01-15 ENCOUNTER — Ambulatory Visit: Payer: BC Managed Care – PPO | Admitting: Internal Medicine

## 2023-01-15 VITALS — BP 118/80 | HR 80 | Temp 98.5°F | Ht 74.0 in | Wt 272.2 lb

## 2023-01-15 DIAGNOSIS — J90 Pleural effusion, not elsewhere classified: Secondary | ICD-10-CM | POA: Insufficient documentation

## 2023-01-15 DIAGNOSIS — J42 Unspecified chronic bronchitis: Secondary | ICD-10-CM | POA: Diagnosis not present

## 2023-01-15 DIAGNOSIS — F17210 Nicotine dependence, cigarettes, uncomplicated: Secondary | ICD-10-CM

## 2023-01-15 LAB — SEDIMENTATION RATE: Sed Rate: 24 mm/hr — ABNORMAL HIGH (ref 0–20)

## 2023-01-15 LAB — CBC WITH DIFFERENTIAL/PLATELET
Abs Immature Granulocytes: 0.04 10*3/uL (ref 0.00–0.07)
Basophils Absolute: 0.1 10*3/uL (ref 0.0–0.1)
Basophils Relative: 1 %
Eosinophils Absolute: 0.6 10*3/uL — ABNORMAL HIGH (ref 0.0–0.5)
Eosinophils Relative: 6 %
HCT: 43.8 % (ref 39.0–52.0)
Hemoglobin: 14.7 g/dL (ref 13.0–17.0)
Immature Granulocytes: 0 %
Lymphocytes Relative: 29 %
Lymphs Abs: 2.8 10*3/uL (ref 0.7–4.0)
MCH: 34.3 pg — ABNORMAL HIGH (ref 26.0–34.0)
MCHC: 33.6 g/dL (ref 30.0–36.0)
MCV: 102.1 fL — ABNORMAL HIGH (ref 80.0–100.0)
Monocytes Absolute: 1 10*3/uL (ref 0.1–1.0)
Monocytes Relative: 11 %
Neutro Abs: 5 10*3/uL (ref 1.7–7.7)
Neutrophils Relative %: 53 %
Platelets: 407 10*3/uL — ABNORMAL HIGH (ref 150–400)
RBC: 4.29 MIL/uL (ref 4.22–5.81)
RDW: 13.5 % (ref 11.5–15.5)
WBC: 9.6 10*3/uL (ref 4.0–10.5)
nRBC: 0 % (ref 0.0–0.2)

## 2023-01-15 LAB — PSA: PSA: 0.55 ng/mL (ref <=4.00)

## 2023-01-15 LAB — VITAMIN D 25 HYDROXY (VIT D DEFICIENCY, FRACTURES): Vit D, 25-Hydroxy: 30 ng/mL (ref 30–100)

## 2023-01-15 LAB — URINALYSIS, ROUTINE W REFLEX MICROSCOPIC
Bacteria, UA: NONE SEEN /HPF
Bilirubin Urine: NEGATIVE
Glucose, UA: NEGATIVE
Hyaline Cast: NONE SEEN /LPF
Ketones, ur: NEGATIVE
Leukocytes,Ua: NEGATIVE
Nitrite: NEGATIVE
Protein, ur: NEGATIVE
Specific Gravity, Urine: 1.022 (ref 1.001–1.035)
Squamous Epithelial / HPF: NONE SEEN /HPF (ref <=5)
WBC, UA: NONE SEEN /HPF (ref 0–5)
pH: 6 (ref 5.0–8.0)

## 2023-01-15 LAB — MICROALBUMIN / CREATININE URINE RATIO
Creatinine, Urine: 135 mg/dL (ref 20–320)
Microalb Creat Ratio: 14 mcg/mg creat (ref <30)
Microalb, Ur: 1.9 mg/dL

## 2023-01-15 LAB — HEMOGLOBIN A1C
Hgb A1c MFr Bld: 6.4 % of total Hgb — ABNORMAL HIGH (ref <5.7)
Mean Plasma Glucose: 137 mg/dL
eAG (mmol/L): 7.6 mmol/L

## 2023-01-15 LAB — LIPID PANEL
Cholesterol: 100 mg/dL (ref <200)
HDL: 53 mg/dL (ref >=40)
LDL Cholesterol (Calc): 32 mg/dL (calc)
Non-HDL Cholesterol (Calc): 47 mg/dL (calc) (ref <130)
Total CHOL/HDL Ratio: 1.9 (calc) (ref <5.0)
Triglycerides: 66 mg/dL (ref <150)

## 2023-01-15 LAB — MICROSCOPIC MESSAGE

## 2023-01-15 LAB — MAGNESIUM: Magnesium: 2 mg/dL (ref 1.5–2.5)

## 2023-01-15 LAB — INSULIN, RANDOM: Insulin: 26.9 u[IU]/mL — ABNORMAL HIGH

## 2023-01-15 LAB — TSH: TSH: 1.98 mIU/L (ref 0.40–4.50)

## 2023-01-15 MED ORDER — BREZTRI AEROSPHERE 160-9-4.8 MCG/ACT IN AERO: 2.0000 | INHALATION_SPRAY | Freq: Two times a day (BID) | RESPIRATORY_TRACT | Status: DC

## 2023-01-15 MED ORDER — BREZTRI AEROSPHERE 160-9-4.8 MCG/ACT IN AERO: 2.0000 | INHALATION_SPRAY | Freq: Two times a day (BID) | RESPIRATORY_TRACT | 0 refills | Status: DC

## 2023-01-15 NOTE — Patient Instructions (Addendum)
For cough > mucinex dm 1200 every 12 hours as needed  Breztri Take 2 puffs first thing in am and then another 2 puffs about 12 hours later.    Work on inhaler technique:  relax and gently blow all the way out then take a nice smooth full deep breath back in, triggering the inhaler at same time you start breathing in.  Hold breath in for at least  5 seconds if you can. Blow out breztri thru nose. Rinse and gargle with water when done.  If mouth or throat bother you at all,  try brushing teeth/gums/tongue with arm and hammer toothpaste/ make a slurry and gargle and spit out.   Please remember to go to the lab and x-ray department  for your tests - we will call you with the results when they are available.       Please schedule a follow up office visit in  2 weeks, sooner if needed  with all medications /inhalers/ solutions in hand so we can verify exactly what you are taking. This includes all medications from all doctors and over the counters

## 2023-01-16 ENCOUNTER — Encounter: Payer: Self-pay | Admitting: Internal Medicine

## 2023-01-16 DIAGNOSIS — F17210 Nicotine dependence, cigarettes, uncomplicated: Secondary | ICD-10-CM | POA: Insufficient documentation

## 2023-01-16 DIAGNOSIS — J42 Unspecified chronic bronchitis: Secondary | ICD-10-CM | POA: Insufficient documentation

## 2023-01-16 NOTE — Assessment & Plan Note (Addendum)
Active smoker   - 01/15/2023  After extensive coaching inhaler device,  effectiveness = 85% > breztri Take 2 puffs first thing in am and then another 2 puffs about 12 hours later. X 2 weeks samples then regroup -   Eos  01/15/2023  =  0.8  I suspect he downplays the severity and duration of his cough which is likely directly related to smoking and probably doesn't really need breztri at this point based on exam/ hx but it may help some with mucociliary clearance and improve the cough and if not then will d/c at f/u in 2 weeks .  In meantime can also use mucinex dm 1200 mg bid prn

## 2023-01-16 NOTE — Assessment & Plan Note (Addendum)
Onset around [place] 2024 assoc with bruising R chest wall but no obvious rib fx - see CT chest 01/06/23   Most likely he tore an intercostal muscle from coughing or sneezing resulting in a R hemothorax and ecchymosis of the R lat chest wall with risk also of PICS (which is an inflammatory pleural reaction to chest wall injury)  - other explanations like pna/ parapneumonic effusion are possible but I assured him malignancy (which is statistically a concern in smoker) is not likely at all here but needs to be excluded as well.  F/u in 2 weeks with another cxr and consider Tap if not resolving - rec call sooner if condition worsens.

## 2023-01-16 NOTE — Assessment & Plan Note (Signed)
Counseled re importance of smoking cessation but did not meet time criteria for separate billing            Each maintenance medication was reviewed in detail including emphasizing most importantly the difference between maintenance and prns and under what circumstances the prns are to be triggered using an action plan format where appropriate.  Total time for H and P, chart review, counseling, reviewing hfa  device(s) and generating customized AVS unique to this office visit / same day charting  > 45 min pt new to me

## 2023-01-20 NOTE — Progress Notes (Signed)
Diabetes Self-Management Education  Plan:   New onset type 2 diabetes mellitus ([place]) Continue lifestyle modifications. Discussed medication if A1c does not respond to lifestyle modifications in the next 3 mo Education: Reviewed 'ABCs' of diabetes management  Discussed goals to be met and/or maintained include A1C (<7) Blood pressure (<130/80) Cholesterol (LDL <70) Continue Eye Exam yearly  Continue Dental Exam Q6 mo Discussed dietary recommendations Discussed Physical Activity recommendations Foot exam to be completed Check and monitor A1C   Medication management All medications discussed and reviewed in full. All questions and concerns regarding medications addressed.    BMI 35.0-35.9,adult Discussed appropriate BMI Goal of losing 1 lb per month. Diet modification. Physical activity. Encouraged/praised to build confidence.  Primary hypertension Increase Losartan to 50 mg BID Discussed DASH (Dietary Approaches to Stop Hypertension) DASH diet is lower in sodium than a typical American diet. Cut back on foods that are high in saturated fat, cholesterol, and trans fats. Eat more whole-grain foods, fish, poultry, and nuts Remain active and exercise as tolerated daily.  Monitor BP at home-Call if greater than 130/80.   Expected Outcomes:   Noted above  Education material provided: Food label handouts  If problems or questions, patient to contact team via:  MyChart  Individualized Plan for Diabetes Self-Management Training:   Learning Objective:  Patient will have a greater understanding of diabetes self-management. Patient education plan is to attend individual and/or group sessions per assessed needs and concerns.    Future DSME appointment:  1 month follow up   Visit Type:    Appt. Start Time: 1130 Appt. 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 male with a diagnosis of Diabetes:  .    Quality Metrics Intervention  Frequency Notes Meaningful Use ACO  Blood Pressure Every Visit  Goal < 140/80 BP < 140/90 BP < 140/90  Dilated Eye Exam Annually     Foot Examination Annually  Every visit if PVD or neuropathy    Fasting serum lipid profile Annually  LDL-C < 100 mg/dL LDL-C < 100 mg/dL  A1C Every 3-6 months  A1c < 9 % A1c <  8%  Urine Alb/Cr ratio Annually     Pneumovax At Dx &  once >age 67  Wait 5 years between first dose and dose after 10.  26 yrs & older received vaccine  Influenza Annually   6 months & older received Flu vaccine October 1 - March 31.       Latest Ref Rng & Units 01/14/2023   12:00 AM 10/05/2022   12:00 AM  Diabetes QM Metrics  HbA1c <5.7 % of total Hgb 6.4    Microalb mg/dL 1.9  1.6   Micro/Creat Ratio <30 mcg/mg creat 14  11   LDL Cacl mg/dL (calc) 32        01/29/2023    9:59 AM 01/21/2023   11:36 AM 01/15/2023    2:53 PM 01/14/2023    9:50 AM 12/30/2022   11:34 AM  BP& BMI   BP 132/86 150/90 118/80 118/80 158/100    ASSESSMENT  Blood pressure (!) 150/90, pulse 63, temperature (!) 97.3 F (36.3 C), height 6' 2"$  (1.88 m), weight 265 lb 9.6 oz (120.5 kg), SpO2 97 %. Body mass index is 34.1 kg/m.

## 2023-01-21 ENCOUNTER — Encounter: Payer: Self-pay | Admitting: Nurse Practitioner

## 2023-01-21 ENCOUNTER — Ambulatory Visit (INDEPENDENT_AMBULATORY_CARE_PROVIDER_SITE_OTHER): Payer: BC Managed Care – PPO | Admitting: Nurse Practitioner

## 2023-01-21 VITALS — BP 150/90 | HR 63 | Temp 97.3°F | Ht 74.0 in | Wt 265.6 lb

## 2023-01-21 DIAGNOSIS — Z79899 Other long term (current) drug therapy: Secondary | ICD-10-CM

## 2023-01-21 DIAGNOSIS — E119 Type 2 diabetes mellitus without complications: Secondary | ICD-10-CM | POA: Diagnosis not present

## 2023-01-21 DIAGNOSIS — Z6835 Body mass index (BMI) 35.0-35.9, adult: Secondary | ICD-10-CM | POA: Diagnosis not present

## 2023-01-21 DIAGNOSIS — I10 Essential (primary) hypertension: Secondary | ICD-10-CM

## 2023-01-22 ENCOUNTER — Other Ambulatory Visit: Payer: Self-pay | Admitting: Internal Medicine

## 2023-01-22 ENCOUNTER — Other Ambulatory Visit: Payer: Self-pay | Admitting: Nurse Practitioner

## 2023-01-22 DIAGNOSIS — E119 Type 2 diabetes mellitus without complications: Secondary | ICD-10-CM

## 2023-01-22 DIAGNOSIS — I10 Essential (primary) hypertension: Secondary | ICD-10-CM

## 2023-01-22 MED ORDER — LOSARTAN POTASSIUM 50 MG PO TABS: 50.0000 mg | ORAL_TABLET | Freq: Every day | ORAL | 0 refills | Status: DC

## 2023-01-22 MED ORDER — BLOOD GLUCOSE MONITOR SYSTEM W/DEVICE KIT: 1.0000 | PACK | Freq: Three times a day (TID) | 0 refills | Status: AC

## 2023-01-25 ENCOUNTER — Telehealth: Payer: Self-pay | Admitting: Nurse Practitioner

## 2023-01-25 DIAGNOSIS — E119 Type 2 diabetes mellitus without complications: Secondary | ICD-10-CM

## 2023-01-25 MED ORDER — LANCETS MISC: 11 refills | Status: AC

## 2023-01-25 MED ORDER — GLUCOSE BLOOD VI STRP: ORAL_STRIP | 12 refills | Status: AC

## 2023-01-25 NOTE — Telephone Encounter (Signed)
Pt received blood sugar monitor but test strips and lancets weren't sent in with it. Can they be sent to [facility] [phone]

## 2023-01-25 NOTE — Addendum Note (Signed)
Addended by: Chancy Hurter on: 01/25/2023 11:31 AM   Modules accepted: Orders

## 2023-01-29 ENCOUNTER — Ambulatory Visit (INDEPENDENT_AMBULATORY_CARE_PROVIDER_SITE_OTHER): Payer: BC Managed Care – PPO | Admitting: Internal Medicine

## 2023-01-29 ENCOUNTER — Ambulatory Visit
Admission: RE | Admit: 2023-01-29 | Discharge: 2023-01-29 | Disposition: A | Discharge status: Home / Self Care | Payer: BC Managed Care – PPO | Source: Ambulatory Visit | Attending: Internal Medicine | Admitting: Internal Medicine

## 2023-01-29 ENCOUNTER — Encounter: Payer: Self-pay | Admitting: Internal Medicine

## 2023-01-29 VITALS — BP 132/86 | HR 75 | Temp 98.3°F | Ht 74.0 in | Wt 263.8 lb

## 2023-01-29 DIAGNOSIS — J90 Pleural effusion, not elsewhere classified: Secondary | ICD-10-CM | POA: Insufficient documentation

## 2023-01-29 DIAGNOSIS — F17210 Nicotine dependence, cigarettes, uncomplicated: Secondary | ICD-10-CM | POA: Diagnosis not present

## 2023-01-29 DIAGNOSIS — J42 Unspecified chronic bronchitis: Secondary | ICD-10-CM | POA: Diagnosis not present

## 2023-01-29 NOTE — Progress Notes (Unsigned)
Erik Quinn, male    DOB: 1966-07-02   MRN: [id number]   Brief patient profile:  50  yowm  active smoker/retired furniture deliveryman  referred to pulmonary clinic in [facility]  01/15/2023 by Dr Melford Aase   for chronic cough/rattle x around 2000  much worse since 3 days prior [place] assoc with new R CP and abnormal  CT  chest 1/17 24 c/w R effusion and some atx RLL       History of Present Illness  01/15/2023  Pulmonary/ 1st office eval/ Maelle Sheaffer / [facility]  Chief Complaint  Patient presents with   Consult    Pain in his side when he coughs or sneezes. Going since the end of December.   Dyspnea:  Not limited by breathing from desired activities   Cough: non-productive tickle in throat x years Sleep: on  L side down/ flat bed 2 pillows  SABA use:  Cp much worse around [place] and eased off x one week prior to OV s specific rx / purplish bruise over R ant/lat chest resolved as well / no obvious rib fix on CT  Hurts to lift anything heavy  Rec For cough > mucinex dm 1200 every 12 hours as needed Breztri Take 2 puffs first thing in am and then another 2 puffs about 12 hours later.   Work on inhaler technique:   Please schedule a follow up office visit in  2 weeks, sooner if needed  with all medications /inhalers/ solutions in hand    01/29/2023  f/u ov/C[facility]/ [facility] re: ? Hemothorax/ ?copd    maint on breztri   Chief Complaint  Patient presents with   Follow-up    No SOB, wheezing or cough.   Dyspnea:  not limited  Cough: none  Sleeping: prefers L side/ 2 pillow SABA use: none  02: none      No obvious day to day or daytime variability or assoc excess/ purulent sputum or mucus plugs or hemoptysis or cp or chest tightness, subjective wheeze or overt sinus or hb symptoms.   *** without nocturnal  or early am exacerbation  of respiratory  c/o's or need for noct saba. Also denies any obvious fluctuation of symptoms with weather or environmental changes or other  aggravating or alleviating factors except as outlined above   No unusual exposure hx or h/o childhood pna/ asthma or knowledge of premature birth.  Current Allergies, Complete Past Medical History, Past Surgical History, Family History, and Social History were reviewed in [employer] record.  ROS  The following are not active complaints unless bolded Hoarseness, sore throat, dysphagia, dental problems, itching, sneezing,  nasal congestion or discharge of excess mucus or purulent secretions, ear ache,   fever, chills, sweats, unintended wt loss or wt gain, classically pleuritic or exertional cp,  orthopnea pnd or arm/hand swelling  or leg swelling, presyncope, palpitations, abdominal pain, anorexia, nausea, vomiting, diarrhea  or change in bowel habits or change in bladder habits, change in stools or change in urine, dysuria, hematuria,  rash, arthralgias, visual complaints, headache, numbness, weakness or ataxia or problems with walking or coordination,  change in mood or  memory.        Current Meds  Medication Sig   Blood Glucose Monitoring Suppl (BLOOD GLUCOSE MONITOR SYSTEM) w/Device KIT 1 Device by Does not apply route in the morning, at noon, and at bedtime.   Budeson-Glycopyrrol-Formoterol (BREZTRI AEROSPHERE) 160-9-4.8 MCG/ACT AERO Inhale 2 puffs into the lungs  2 (two) times daily.   busPIRone (BUSPAR) 5 MG tablet Take     1/2 to 1 tablet     3 x /day      for Anxiety   cyanocobalamin 1000 MCG tablet Take 1 tablet (1,000 mcg total) by mouth daily.   glucose blood test strip Test blood sugar once daily or as directed   Lancets MISC Test blood sugar once daily or as directed   losartan (COZAAR) 50 MG tablet Take 1 tablet (50 mg total) by mouth daily.           Objective:    Wt Readings from Last 3 Encounters:  01/29/23 263 lb 12.8 oz (119.7 kg)  01/21/23 265 lb 9.6 oz (120.5 kg)  01/15/23 272 lb 3.2 oz (123.5 kg)      Vital signs reviewed  01/29/2023  - Note  at rest 02 sats  ***% on ***   General appearance:    ***                CXR PA and Lateral:   01/15/2023 :    I personally reviewed images and impression is as follows:     Small R Pl effusion     Lab Results  Component Value Date   ESRSEDRATE 24 (H) 01/15/2023   ESRSEDRATE 2 10/31/2020       Lab Results  Component Value Date   HGB 14.7 01/15/2023   HGB 16.4 12/30/2022   HGB 16.1 10/05/2022       Assessment

## 2023-01-29 NOTE — Patient Instructions (Addendum)
Congratulations on not smoking   Please remember to go to the  x-ray department  for your tests - we will call you with the results when they are available   Ok to try off [place] - call if you want to continue it for your breathing or coughing

## 2023-01-30 ENCOUNTER — Encounter: Payer: Self-pay | Admitting: Internal Medicine

## 2023-01-30 NOTE — Assessment & Plan Note (Signed)
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

## 2023-01-30 NOTE — Assessment & Plan Note (Signed)
Active smoker   - 01/15/2023  After extensive coaching inhaler device,  effectiveness = 85% > breztri Take 2 puffs first thing in am and then another 2 puffs about 12 hours later. X 2 weeks samples then regroup - Eos  01/15/2023  =  0.8   Some better > continue breztri 2 bid for now

## 2023-01-30 NOTE — Assessment & Plan Note (Signed)
Counseled re importance of smoking cessation but did not meet time criteria for separate billing           Each maintenance medication was reviewed in detail including emphasizing most importantly the difference between maintenance and prns and under what circumstances the prns are to be triggered using an action plan format where appropriate.  Total time for H and P, chart review, counseling, reviewing hfa  device(s) and generating customized AVS unique to this office visit / same day charting = 23 min

## 2023-01-31 ENCOUNTER — Telehealth: Payer: Self-pay | Admitting: Internal Medicine

## 2023-01-31 DIAGNOSIS — J90 Pleural effusion, not elsewhere classified: Secondary | ICD-10-CM

## 2023-01-31 NOTE — Patient Instructions (Signed)
Hemoglobin A1C Test Why am I having this test? You may have the hemoglobin A1C test (A1C test) done to: Check your risk for developing diabetes. Diagnose diabetes. Check the long-term control of blood sugar (glucose) in people who have diabetes and help make treatment decisions. This test may be done with other blood glucose tests, such as fasting blood glucose and oral glucose tolerance tests. What is being tested? Hemoglobin is a type of protein in the blood that carries oxygen. Glucose attaches to hemoglobin to form glycated hemoglobin. This test checks the amount of glycated hemoglobin in your blood. This is a good indicator of the average amount of glucose in your blood during the past 2-3 months. What kind of sample is taken?  A blood sample is required for this test. It is usually collected by inserting a needle into a blood vessel. Tell a health care provider about: All medicines you are taking, including vitamins, herbs, eye drops, creams, and over-the-counter medicines. Any blood disorders you have. Any surgeries you have had. Any medical conditions you have. Whether you are pregnant or may be pregnant. How are the results reported? Your results will be reported as a percentage indicating how much of your hemoglobin has glucose attached to it. Your health care provider will compare your results to normal ranges established after testing a large group of people (reference ranges). Reference ranges may vary among labs and hospitals. For this test, common reference ranges are: Adult or child without diabetes: 4-5.6%. Adult or child with prediabetes: 5.7%-6.4%. Adult or child with diabetes: 6.5% or higher. What do the results mean? If you do not have diabetes: A result within the reference range means that you are not at high risk for diabetes. A result of 5.7-6.4% means that you have a high risk of developing diabetes, and you have prediabetes. Having prediabetes puts you at risk for  developing type 2 diabetes. You may have more tests, including a repeat A1C test. Results of 6.5% or higher on two separate A1C tests mean that you have diabetes. You may have more tests to confirm the diagnosis. If you have diabetes: A result of 7% usually means that your diabetes is under control. A result of less than 7% also means that diabetes is under control. This result may be an appropriate goal for those for whom there is a low risk of low blood sugar (hypoglycemia). A result of less than 8% may also mean that diabetes is under control. This result may be an appropriate goal for those who do not benefit from more blood glucose control or who are at high risk for hypoglycemia. Abnormally low A1C values may be caused by: Pregnancy. Severe blood loss. Receiving donated blood (transfusions). Low red blood cell count (anemia). Long-term kidney failure. Some unusual forms (variants) of hemoglobin. Talk with your health care provider about what your results mean. Questions to ask your health care provider Ask your health care provider, or the department that is doing the test: When will my results be ready? How will I get my results? What are my treatment options? What other tests do I need? What are my next steps? Summary The A1C test is done to check your risk for developing diabetes, diagnose diabetes, and check the long-term control of blood sugar (glucose) in people who have diabetes and help make treatment decisions. Hemoglobin is a type of protein in the blood that carries oxygen. Glucose attaches to hemoglobin to form glycated hemoglobin. This test checks the amount  of glycated hemoglobin in your blood. Talk with your health care provider about what your results mean. This information is not intended to replace advice given to you by your health care provider. Make sure you discuss any questions you have with your health care provider. Document Revised: 07/14/2022 Document  Reviewed: 01/21/2022 Elsevier Patient Education  [place].

## 2023-01-31 NOTE — Telephone Encounter (Signed)
Fluid is same so ideally needs to be removed and I rec it be done by u/s so I ordered it with studies - happy to discuss this further with pt if he'll give me a window when he'll be availabee or I can call him when the results come back

## 2023-02-01 NOTE — Telephone Encounter (Addendum)
I notified the patient. He is ok with scheduling an appointment for the Thoracentesis. I told him someone will reach out to him with an appointment.  [place] further needed.

## 2023-02-04 ENCOUNTER — Other Ambulatory Visit (HOSPITAL_COMMUNITY): Payer: BC Managed Care – PPO

## 2023-02-04 LAB — COLOGUARD: COLOGUARD: NEGATIVE

## 2023-02-05 ENCOUNTER — Ambulatory Visit (HOSPITAL_COMMUNITY)
Admission: RE | Admit: 2023-02-05 | Discharge: 2023-02-05 | Disposition: A | Discharge status: Home / Self Care | Payer: BC Managed Care – PPO | Source: Ambulatory Visit | Attending: Internal Medicine | Admitting: Internal Medicine

## 2023-02-05 DIAGNOSIS — F17200 Nicotine dependence, unspecified, uncomplicated: Secondary | ICD-10-CM | POA: Insufficient documentation

## 2023-02-05 DIAGNOSIS — J90 Pleural effusion, not elsewhere classified: Secondary | ICD-10-CM | POA: Diagnosis present

## 2023-02-05 DIAGNOSIS — R053 Chronic cough: Secondary | ICD-10-CM | POA: Insufficient documentation

## 2023-02-05 LAB — BODY FLUID CELL COUNT WITH DIFFERENTIAL
Eos, Fluid: 66 %
Lymphs, Fluid: 29 %
Monocyte-Macrophage-Serous Fluid: 5 % — ABNORMAL LOW (ref 50–90)
Total Nucleated Cell Count, Fluid: 8585 cu mm — ABNORMAL HIGH (ref 0–1000)

## 2023-02-05 LAB — PROTEIN, PLEURAL OR PERITONEAL FLUID: Total protein, fluid: 4.4 g/dL

## 2023-02-05 LAB — LACTATE DEHYDROGENASE, PLEURAL OR PERITONEAL FLUID: LD, Fluid: 400 U/L — ABNORMAL HIGH (ref 3–23)

## 2023-02-05 LAB — GLUCOSE, PLEURAL OR PERITONEAL FLUID: Glucose, Fluid: 115 mg/dL

## 2023-02-05 LAB — AMYLASE, PLEURAL OR PERITONEAL FLUID: Amylase, Fluid: 41 U/L

## 2023-02-05 MED ORDER — LIDOCAINE HCL 1 % IJ SOLN
INTRAMUSCULAR | Status: AC
  Administered 2023-02-05: 12 mL
  Filled 2023-02-05: qty 20

## 2023-02-05 NOTE — Procedures (Signed)
PROCEDURE SUMMARY:  Successful US guided diagnostic and therapeutic right thoracentesis. Yielded 650 cc of hazy, amber fluid. Pt tolerated procedure well. No immediate complications.  Specimen was sent for labs. CXR ordered.  EBL < 1 mL  Tyson Alias, AGNP 02/05/2023 9:44 AM

## 2023-02-09 LAB — CYTOLOGY - NON PAP

## 2023-02-16 NOTE — Progress Notes (Signed)
Called the pt and there was no answer- LMTCB.  

## 2023-02-18 ENCOUNTER — Telehealth: Payer: Self-pay | Admitting: Internal Medicine

## 2023-02-18 NOTE — Telephone Encounter (Signed)
OK to leave detailed msg @ [phone] (HIPA Signed for detailed msg in Docs.)

## 2023-02-18 NOTE — Telephone Encounter (Signed)
PT states he missed a call from [place]. Thinks it may be results from his recent Thoracentesis.  Pls call to advise him @ [phone]

## 2023-02-18 NOTE — Progress Notes (Signed)
Spoke with pt and notified of results per Dr. Melvyn Novas. Pt verbalized understanding. He reports that he is feeling well and does not think he needs continued f/u here. Declined making an appt.

## 2023-02-19 NOTE — Telephone Encounter (Signed)
They dribbled in over several days but are all c/w chest wall injury from coughing - no cancer or infection > rec f/u ov unless feeling back to 100% as may need to repeat the cxr or ct scan to be sure it's completely healing - if feeling great then f/u can be prn

## 2023-02-19 NOTE — Telephone Encounter (Signed)
Patient is requesting results from Thoracentesis. Dr. Melvyn Novas can you please advise

## 2023-02-19 NOTE — Telephone Encounter (Signed)
ATC X1 LVM for patient to call the office back

## 2023-02-23 NOTE — Telephone Encounter (Signed)
Gave PT results. He expressed understanding and did not want a FU appt.

## 2023-03-17 NOTE — Telephone Encounter (Signed)
j

## 2023-03-28 ENCOUNTER — Other Ambulatory Visit: Payer: Self-pay | Admitting: Nurse Practitioner

## 2023-03-28 DIAGNOSIS — R454 Irritability and anger: Secondary | ICD-10-CM

## 2023-03-28 DIAGNOSIS — F419 Anxiety disorder, unspecified: Secondary | ICD-10-CM

## 2023-04-15 ENCOUNTER — Ambulatory Visit (INDEPENDENT_AMBULATORY_CARE_PROVIDER_SITE_OTHER): Payer: BC Managed Care – PPO | Admitting: Nurse Practitioner

## 2023-04-15 ENCOUNTER — Encounter: Payer: Self-pay | Admitting: Nurse Practitioner

## 2023-04-15 VITALS — BP 110/82 | HR 59 | Temp 97.7°F | Ht 74.0 in | Wt 256.2 lb

## 2023-04-15 DIAGNOSIS — I10 Essential (primary) hypertension: Secondary | ICD-10-CM | POA: Diagnosis not present

## 2023-04-15 DIAGNOSIS — J439 Emphysema, unspecified: Secondary | ICD-10-CM

## 2023-04-15 DIAGNOSIS — Z6835 Body mass index (BMI) 35.0-35.9, adult: Secondary | ICD-10-CM

## 2023-04-15 DIAGNOSIS — E782 Mixed hyperlipidemia: Secondary | ICD-10-CM | POA: Diagnosis not present

## 2023-04-15 DIAGNOSIS — E1159 Type 2 diabetes mellitus with other circulatory complications: Secondary | ICD-10-CM | POA: Diagnosis not present

## 2023-04-15 DIAGNOSIS — Z79899 Other long term (current) drug therapy: Secondary | ICD-10-CM

## 2023-04-15 DIAGNOSIS — F17200 Nicotine dependence, unspecified, uncomplicated: Secondary | ICD-10-CM | POA: Diagnosis not present

## 2023-04-15 DIAGNOSIS — E559 Vitamin D deficiency, unspecified: Secondary | ICD-10-CM | POA: Diagnosis not present

## 2023-04-15 DIAGNOSIS — D3502 Benign neoplasm of left adrenal gland: Secondary | ICD-10-CM

## 2023-04-15 DIAGNOSIS — F419 Anxiety disorder, unspecified: Secondary | ICD-10-CM

## 2023-04-15 DIAGNOSIS — R454 Irritability and anger: Secondary | ICD-10-CM

## 2023-04-15 MED ORDER — LOSARTAN POTASSIUM 50 MG PO TABS: 50.0000 mg | ORAL_TABLET | Freq: Every day | ORAL | 2 refills | Status: DC

## 2023-04-15 NOTE — Patient Instructions (Signed)

## 2023-04-15 NOTE — Progress Notes (Signed)
General Follow Up  Assessment and Plan:   Primary hypertension Controlled Continue Losartan Discussed DASH (Dietary Approaches to Stop Hypertension) DASH diet is lower in sodium than a typical American diet. Cut back on foods that are high in saturated fat, cholesterol, and trans fats. Eat more whole-grain foods, fish, poultry, and nuts Remain active and exercise as tolerated daily.  Monitor BP at home-Call if greater than 130/80.  Check CMP/CBC  DM2 Controlled by lifestyle/diet  Discussed carb counting Education: Reviewed 'ABCs' of diabetes management  Discussed goals to be met and/or maintained include A1C (<7) Blood pressure (<130/80) Cholesterol (LDL <70) Continue Eye Exam yearly  Continue Dental Exam Q6 mo Discussed dietary recommendations Discussed Physical Activity recommendations Check A1C  Pulmonary emphysema, unspecified emphysema type (HCC)/Right Rib Pain Pulmonary Dr. Sherene Sires, [place] following Continue inhalers  Smoker Continuing to cut down on cigarettes. Smoking cessation instruction/counseling given:  counseled patient on the dangers of tobacco use, advised patient to stop smoking, and reviewed strategies to maximize success  Anxiety/Irritable Continue Buspar.   Reviewed relaxation techniques.  Sleep hygiene. Recommended Cognitive Behavioral Therapy (CBT) - defers. Recommended mindfulness meditation and exercise.   Encouraged personality growth wand development through coping techniques and problem-solving skills. Limit/Decrease/Monitor drug/alcohol intake.    Vitamin D deficiency Continue supplement Monitor levels  Medication management All medications discussed and reviewed in full. All questions and concerns regarding medications addressed.    BMI 35.0-35.9,adult Discussed appropriate BMI Diet modification. Physical activity. Encouraged/praised to build confidence.  Adenoma of left adrenal gland Continue to monitor  Orders Placed This  Encounter  Procedures   CBC with Differential/Platelet   COMPLETE METABOLIC PANEL WITH GFR   Hemoglobin A1c   VITAMIN D 25 Hydroxy (Vit-D Deficiency, Fractures)   Meds ordered this encounter  Medications   losartan (COZAAR) 50 MG tablet    Sig: Take 1 tablet (50 mg total) by mouth daily.    Dispense:  90 tablet    Refill:  2    Order Specific Question:   Supervising Provider    Answer:   Lucky Cowboy [phone]    Notify office for further evaluation and treatment, questions or concerns if any reported s/s fail to improve.   The patient was advised to call back or seek an in-person evaluation if any symptoms worsen or if the condition fails to improve as anticipated.   Further disposition pending results of labs. Discussed med's effects and SE's.    I discussed the assessment and treatment plan with the patient. The patient was provided an opportunity to ask questions and all were answered. The patient agreed with the plan and demonstrated an understanding of the instructions.  Discussed med's effects and SE's. Screening labs and tests as requested with regular follow-up as recommended.  I provided 40 minutes of face-to-face time during this encounter including counseling, chart review, and critical decision making was preformed.   Future Appointments  Date Time Provider Department Center  01/18/2024 10:00 AM Adela Glimpse, NP [facility] None    HPI  57 y.o. male  presents for a complete physical. He has Vitamin D deficiency; Hypertension; Prediabetes; Medication management; Screening cholesterol level; Degenerative joint disease (DJD) of hip; BMI 35.0-35.9,adult; Pyelonephritis; Abdominal pain; Constipation; AKI (acute kidney injury); Macrocytosis; Headache; Pleural effusion on right; Chronic bronchitis; and Cigarette smoker on their problem list.  Overall he reports feeling well today.    Continues to be a current every day smoker but trying to cut down.  Shares he has been  smoking  1-2 weekly.    He has been seen by Pulmonology was has inhaler but does not use them as directed.   He had a hospital encounter 10/05/2022 for nephrolithiasis, hematuria, pyelonephritis which resolved.  Ultrasound showed well circumcised anechoic lesion adjacent to the gallbladder   He did note lower abdomina pain during this time.  Noted to have an 1.7 cm adenoma of left adrenal gland that is currently being monitored. He completed an outpatient MRI revealed no suspicious hepatic lesion, specifically no lesion oalong the gallbladder fossa to correspond with findings on prior US.     BMI is Body mass index is 32.89 kg/m., he is continuing to work on diet and exercise.  Almost cut out carbs completely from diet. Wt Readings from Last 3 Encounters:  04/15/23 256 lb 3.2 oz (116.2 kg)  01/29/23 263 lb 12.8 oz (119.7 kg)  01/21/23 265 lb 9.6 oz (120.5 kg)   His blood pressure has been controlled at home, today their BP is BP: 110/82 He does not workout. He denies chest pain, shortness of breath, dizziness.   He is not on cholesterol medication and denies myalgias. His cholesterol is at goal. The cholesterol last visit was:   Lab Results  Component Value Date   CHOL 100 01/14/2023   HDL 53 01/14/2023   LDLCALC 32 01/14/2023   TRIG 66 01/14/2023   CHOLHDL 1.9 01/14/2023    He has not been working on diet and exercise for DM is, he is not on bASA, he is on ACE/ARB and denies polydipsia and polyuria. Last A1C in the office was:  Lab Results  Component Value Date   HGBA1C 6.4 (H) 01/14/2023   Last GFR: Lab Results  Component Value Date   EGFR 96 12/30/2022   Patient is on Vitamin D supplement.   Lab Results  Component Value Date   VD25OH 30 01/14/2023   Current Medications:  Current Outpatient Medications on File Prior to Visit  Medication Sig Dispense Refill   Blood Glucose Monitoring Suppl (BLOOD GLUCOSE MONITOR SYSTEM) w/Device KIT 1 Device by Does not apply route in the  morning, at noon, and at bedtime. 1 kit 0   Budeson-Glycopyrrol-Formoterol (BREZTRI AEROSPHERE) 160-9-4.8 MCG/ACT AERO Inhale 2 puffs into the lungs 2 (two) times daily.     Budeson-Glycopyrrol-Formoterol (BREZTRI AEROSPHERE) 160-9-4.8 MCG/ACT AERO Inhale 2 puffs into the lungs in the morning and at bedtime. 5.9 g 0   busPIRone (BUSPAR) 5 MG tablet TAKE 1/2 TO 1 TABLET BY MOUTH 3 TIMES A DAY FOR ANXIETY 270 tablet 0   cyanocobalamin 1000 MCG tablet Take 1 tablet (1,000 mcg total) by mouth daily. 100 tablet 0   glucose blood test strip Test blood sugar once daily or as directed 100 each 12   Lancets MISC Test blood sugar once daily or as directed 200 each 11   losartan (COZAAR) 50 MG tablet Take 1 tablet (50 mg total) by mouth daily. 90 tablet 0   No current facility-administered medications on file prior to visit.   Allergies:  No Known Allergies Medical History:  He has Vitamin D deficiency; Hypertension; Prediabetes; Medication management; Screening cholesterol level; Degenerative joint disease (DJD) of hip; BMI 35.0-35.9,adult; Pyelonephritis; Abdominal pain; Constipation; AKI (acute kidney injury); Macrocytosis; Headache; Pleural effusion on right; Chronic bronchitis; and Cigarette smoker on their problem list.  Health Maintenance:   Immunization History  Administered Date(s) Administered   Influenza Split 10/24/2014   Influenza,inj,Quad PF,6+ Mos 09/24/2021, 09/09/2022   Influenza-Unspecified 09/20/2018, 09/23/2020  PFIZER(Purple Top)SARS-COV-2 Vaccination 03/21/2020, 04/11/2020, 11/23/2020   PNEUMOCOCCAL CONJUGATE-20 09/09/2022   PPD Test 10/24/2014, 12/06/2018, 12/03/2021   Pfizer Covid-19 Vaccine Bivalent Booster 21yrs & up 10/29/2021   Pneumococcal Conjugate-13 10/24/2014   Pneumococcal-Unspecified 10/20/2013   Tdap 10/20/2013   Zoster Recombinat (Shingrix) 09/24/2021, 04/13/2022   Health Maintenance  Topic Date Due   COVID-19 Vaccine (5 - 2023-24 season) 08/21/2022    INFLUENZA VACCINE  07/22/2023   DTaP/Tdap/Td (2 - Td or Tdap) 10/21/2023   Diabetic kidney evaluation - eGFR measurement  12/31/2023   Lung Cancer Screening  01/07/2024   Diabetic kidney evaluation - Urine ACR  01/15/2024   COLONOSCOPY (Pts 45-66yrs Insurance coverage will need to be confirmed)  08/25/2026   Hepatitis C Screening  Completed   HIV Screening  Completed   Zoster Vaccines- Shingrix  Completed   HPV VACCINES  Aged Out    Patient Care Team: Lucky Cowboy, MD as PCP - General (Internal Medicine)  Surgical History:  He has a past surgical history that includes Splenectomy (1994); Total hip arthroplasty; Knee arthroscopy with lateral menisectomy (Left, 09/01/2017); Arm surgery (Left); and Cystoscopy/ureteroscopy/holmium laser/stent placement (Left, 07/20/2022). Family History:  Hisfamily history includes Diabetes in his mother; Sleep apnea in his mother. Social History:  He reports that he quit smoking about 3 months ago. His smoking use included cigarettes. He has a 20.00 pack-year smoking history. He has never used smokeless tobacco. He reports current alcohol use of about 8.0 standard drinks of alcohol per week. He reports that he does not use drugs.  Review of Systems: Review of Systems  Constitutional: Negative.   HENT: Negative.    Eyes: Negative.   Respiratory:  Positive for cough.   Cardiovascular: Negative.   Gastrointestinal: Negative.   Genitourinary: Negative.   Musculoskeletal: Negative.   Skin: Negative.   Neurological: Negative.   Endo/Heme/Allergies: Negative.   Psychiatric/Behavioral:  The patient is nervous/anxious.     Physical Exam: Estimated body mass index is 32.89 kg/m as calculated from the following:   Height as of this encounter: 6\' 2"  (1.88 m).   Weight as of this encounter: 256 lb 3.2 oz (116.2 kg). BP 110/82   Pulse (!) 59   Temp 97.7 F (36.5 C)   Ht 6\' 2"  (1.88 m)   Wt 256 lb 3.2 oz (116.2 kg)   SpO2 98%   BMI 32.89 kg/m    General Appearance: Well nourished, obese, in no apparent distress.  Eyes: PERRLA, EOMs, conjunctiva no swelling or erythema, normal fundi and vessels.  Sinuses: No Frontal/maxillary tenderness  ENT/Mouth: Ext aud canals clear, normal light reflex with TMs without erythema, bulging. Good dentition. No erythema, swelling, or exudate on post pharynx. Tonsils not swollen or erythematous. Hearing normal.  Neck: Supple, thyroid normal. No bruits  Respiratory: Respiratory effort normal, BS equal bilaterally without rales, rhonchi, wheezing or stridor.  Cardio: RRR without murmurs, rubs or gallops. Brisk peripheral pulses without edema.  Chest: symmetric, with normal excursions and percussion.  Abdomen: Soft, nontender, no guarding, rebound, hernias, masses, or organomegaly.  Lymphatics: Non tender without lymphadenopathy.  Musculoskeletal: Full ROM all peripheral extremities,5/5 strength, and normal gait.  Skin: Warm, dry without rashes, lesions, ecchymosis. Neuro: Cranial nerves intact, reflexes equal bilaterally. Normal muscle tone, no cerebellar symptoms. Sensation intact.  Psych: Awake and oriented X 3, normal affect, Insight and Judgment appropriate.    Adela Glimpse, NP 10:52 AM [facility]

## 2023-04-16 LAB — COMPLETE METABOLIC PANEL WITH GFR
AG Ratio: 1.3 (calc) (ref 1.0–2.5)
ALT: 19 U/L (ref 9–46)
AST: 19 U/L (ref 10–35)
Albumin: 3.8 g/dL (ref 3.6–5.1)
Alkaline phosphatase (APISO): 90 U/L (ref 35–144)
BUN: 19 mg/dL (ref 7–25)
CO2: 29 mmol/L (ref 20–32)
Calcium: 9.1 mg/dL (ref 8.6–10.3)
Chloride: 104 mmol/L (ref 98–110)
Creat: 0.86 mg/dL (ref 0.70–1.30)
Globulin: 3 g/dL (calc) (ref 1.9–3.7)
Glucose, Bld: 82 mg/dL (ref 65–99)
Potassium: 4.7 mmol/L (ref 3.5–5.3)
Sodium: 141 mmol/L (ref 135–146)
Total Bilirubin: 0.4 mg/dL (ref 0.2–1.2)
Total Protein: 6.8 g/dL (ref 6.1–8.1)
eGFR: 101 mL/min/{1.73_m2} (ref >=60)

## 2023-04-16 LAB — CBC WITH DIFFERENTIAL/PLATELET
Absolute Monocytes: 770 cells/uL (ref 200–950)
Basophils Absolute: 72 cells/uL (ref 0–200)
Basophils Relative: 1 %
Eosinophils Absolute: 266 cells/uL (ref 15–500)
Eosinophils Relative: 3.7 %
HCT: 46.6 % (ref 38.5–50.0)
Hemoglobin: 15.8 g/dL (ref 13.2–17.1)
Lymphs Abs: 1656 cells/uL (ref 850–3900)
MCH: 34 pg — ABNORMAL HIGH (ref 27.0–33.0)
MCHC: 33.9 g/dL (ref 32.0–36.0)
MCV: 100.2 fL — ABNORMAL HIGH (ref 80.0–100.0)
MPV: 10.4 fL (ref 7.5–12.5)
Monocytes Relative: 10.7 %
Neutro Abs: 4435 cells/uL (ref 1500–7800)
Neutrophils Relative %: 61.6 %
Platelets: 300 10*3/uL (ref 140–400)
RBC: 4.65 10*6/uL (ref 4.20–5.80)
RDW: 13 % (ref 11.0–15.0)
Total Lymphocyte: 23 %
WBC: 7.2 10*3/uL (ref 3.8–10.8)

## 2023-04-16 LAB — HEMOGLOBIN A1C
Hgb A1c MFr Bld: 5.8 % of total Hgb — ABNORMAL HIGH (ref <5.7)
Mean Plasma Glucose: 120 mg/dL
eAG (mmol/L): 6.6 mmol/L

## 2023-04-16 LAB — VITAMIN D 25 HYDROXY (VIT D DEFICIENCY, FRACTURES): Vit D, 25-Hydroxy: 45 ng/mL (ref 30–100)

## 2023-04-20 NOTE — Progress Notes (Signed)
Patient is aware of lab results and instructions. -e welch

## 2023-07-01 ENCOUNTER — Other Ambulatory Visit: Payer: Self-pay | Admitting: Nurse Practitioner

## 2023-07-01 DIAGNOSIS — F419 Anxiety disorder, unspecified: Secondary | ICD-10-CM

## 2023-07-01 DIAGNOSIS — R454 Irritability and anger: Secondary | ICD-10-CM

## 2023-07-15 ENCOUNTER — Ambulatory Visit (INDEPENDENT_AMBULATORY_CARE_PROVIDER_SITE_OTHER): Payer: BC Managed Care – PPO | Admitting: Nurse Practitioner

## 2023-07-15 ENCOUNTER — Encounter: Payer: Self-pay | Admitting: Nurse Practitioner

## 2023-07-15 VITALS — BP 142/86 | HR 50 | Temp 98.0°F | Ht 74.0 in | Wt 247.0 lb

## 2023-07-15 DIAGNOSIS — D3502 Benign neoplasm of left adrenal gland: Secondary | ICD-10-CM

## 2023-07-15 DIAGNOSIS — E559 Vitamin D deficiency, unspecified: Secondary | ICD-10-CM

## 2023-07-15 DIAGNOSIS — I10 Essential (primary) hypertension: Secondary | ICD-10-CM

## 2023-07-15 DIAGNOSIS — E1159 Type 2 diabetes mellitus with other circulatory complications: Secondary | ICD-10-CM

## 2023-07-15 DIAGNOSIS — F17200 Nicotine dependence, unspecified, uncomplicated: Secondary | ICD-10-CM

## 2023-07-15 DIAGNOSIS — F419 Anxiety disorder, unspecified: Secondary | ICD-10-CM

## 2023-07-15 DIAGNOSIS — J439 Emphysema, unspecified: Secondary | ICD-10-CM

## 2023-07-15 DIAGNOSIS — Z79899 Other long term (current) drug therapy: Secondary | ICD-10-CM

## 2023-07-15 DIAGNOSIS — E782 Mixed hyperlipidemia: Secondary | ICD-10-CM | POA: Diagnosis not present

## 2023-07-15 DIAGNOSIS — Z6835 Body mass index (BMI) 35.0-35.9, adult: Secondary | ICD-10-CM

## 2023-07-15 LAB — CBC WITH DIFFERENTIAL/PLATELET
Absolute Monocytes: 880 cells/uL (ref 200–950)
Basophils Absolute: 62 cells/uL (ref 0–200)
Basophils Relative: 0.7 %
Eosinophils Absolute: 167 cells/uL (ref 15–500)
Eosinophils Relative: 1.9 %
HCT: 49.2 % (ref 38.5–50.0)
Hemoglobin: 16.6 g/dL (ref 13.2–17.1)
Lymphs Abs: 2006 cells/uL (ref 850–3900)
MCH: 34.5 pg — ABNORMAL HIGH (ref 27.0–33.0)
MCHC: 33.7 g/dL (ref 32.0–36.0)
MCV: 102.3 fL — ABNORMAL HIGH (ref 80.0–100.0)
MPV: 10.8 fL (ref 7.5–12.5)
Monocytes Relative: 10 %
Neutro Abs: 5685 cells/uL (ref 1500–7800)
Neutrophils Relative %: 64.6 %
Platelets: 261 10*3/uL (ref 140–400)
RBC: 4.81 10*6/uL (ref 4.20–5.80)
RDW: 12.6 % (ref 11.0–15.0)
WBC: 8.8 10*3/uL (ref 3.8–10.8)

## 2023-07-15 MED ORDER — LOSARTAN POTASSIUM 50 MG PO TABS: 50.0000 mg | ORAL_TABLET | Freq: Every day | ORAL | 2 refills | Status: DC

## 2023-07-15 NOTE — Patient Instructions (Signed)

## 2023-07-15 NOTE — Progress Notes (Signed)
General Follow Up  Assessment and Plan:  Primary hypertension Controlled Continue Losartan Discussed DASH (Dietary Approaches to Stop Hypertension) DASH diet is lower in sodium than a typical American diet. Cut back on foods that are high in saturated fat, cholesterol, and trans fats. Eat more whole-grain foods, fish, poultry, and nuts Remain active and exercise as tolerated daily.  Monitor BP at home-Call if greater than 130/80.  Check CMP/CBC  DM2 Controlled by lifestyle/diet  Discussed carb counting Education: Reviewed 'ABCs' of diabetes management  Discussed goals to be met and/or maintained include A1C (<7) Blood pressure (<130/80) Cholesterol (LDL <70) Continue Eye Exam yearly  Continue Dental Exam Q6 mo Discussed dietary recommendations Discussed Physical Activity recommendations Check A1C  Pulmonary emphysema, unspecified emphysema type (HCC)/Right Rib Pain Pulmonary Dr. Sherene Sires, [place] following Continue inhalers  Smoker Continuing to cut down on cigarettes. Smoking cessation instruction/counseling given:  counseled patient on the dangers of tobacco use, advised patient to stop smoking, and reviewed strategies to maximize success  Anxiety/Irritable Continue Buspar.   Reviewed relaxation techniques.  Sleep hygiene. Recommended Cognitive Behavioral Therapy (CBT) - defers. Recommended mindfulness meditation and exercise.   Encouraged personality growth wand development through coping techniques and problem-solving skills. Limit/Decrease/Monitor drug/alcohol intake.    Vitamin D deficiency Continue supplement Monitor levels  Medication management All medications discussed and reviewed in full. All questions and concerns regarding medications addressed.    BMI 35.0-35.9,adult Discussed appropriate BMI Diet modification. Physical activity. Encouraged/praised to build confidence.  Adenoma of left adrenal gland Continue to monitor  Orders Placed This  Encounter  Procedures   CBC with Differential/Platelet   COMPLETE METABOLIC PANEL WITH GFR   Hemoglobin A1C w/out eAG   Lipid panel   Meds ordered this encounter  Medications   losartan (COZAAR) 50 MG tablet    Sig: Take 1 tablet (50 mg total) by mouth daily.    Dispense:  90 tablet    Refill:  2   Notify office for further evaluation and treatment, questions or concerns if any reported s/s fail to improve.   The patient was advised to call back or seek an in-person evaluation if any symptoms worsen or if the condition fails to improve as anticipated.   Further disposition pending results of labs. Discussed med's effects and SE's.    I discussed the assessment and treatment plan with the patient. The patient was provided an opportunity to ask questions and all were answered. The patient agreed with the plan and demonstrated an understanding of the instructions.  Discussed med's effects and SE's. Screening labs and tests as requested with regular follow-up as recommended.  I provided 25 minutes of face-to-face time during this encounter including counseling, chart review, and critical decision making was preformed.    Future Appointments  Date Time Provider Department Center  01/18/2024 10:00 AM Adela Glimpse, NP [facility] None    HPI  57 y.o. male  presents for a general 3 month follow up. He has Vitamin D deficiency; Hypertension; Prediabetes; Medication management; Screening cholesterol level; Degenerative joint disease (DJD) of hip; BMI 35.0-35.9,adult; Pyelonephritis; Abdominal pain; Constipation; AKI (acute kidney injury) (HCC); Macrocytosis; Headache; Pleural effusion on right; Chronic bronchitis (HCC); and Cigarette smoker on their problem list.  Overall he reports feeling well today.    Continues to be a current every day smoker but trying to cut down.  Shares he has been smoking 1-2 weekly.    He has been seen by Pulmonology was has inhaler but does not use them  as  directed.   He had a hospital encounter 10/05/2022 for nephrolithiasis, hematuria, pyelonephritis which resolved.  Ultrasound showed well circumcised anechoic lesion adjacent to the gallbladder   He did note lower abdomina pain during this time.  Noted to have an 1.7 cm adenoma of left adrenal gland that is currently being monitored. He completed an outpatient MRI revealed no suspicious hepatic lesion, specifically no lesion oalong the gallbladder fossa to correspond with findings on prior US.     BMI is Body mass index is 31.71 kg/m., he is continuing to work on diet and exercise.  Almost cut out carbs completely from diet. Wt Readings from Last 3 Encounters:  07/15/23 247 lb (112 kg)  04/15/23 256 lb 3.2 oz (116.2 kg)  01/29/23 263 lb 12.8 oz (119.7 kg)   His blood pressure has been controlled at home, today their BP is BP: (!) 142/86 He does not workout. He denies chest pain, shortness of breath, dizziness.   He is not on cholesterol medication and denies myalgias. His cholesterol is at goal. The cholesterol last visit was:   Lab Results  Component Value Date   CHOL 100 01/14/2023   HDL 53 01/14/2023   LDLCALC 32 01/14/2023   TRIG 66 01/14/2023   CHOLHDL 1.9 01/14/2023    He has not been working on diet and exercise for DM is, he is not on bASA, he is on ACE/ARB and denies polydipsia and polyuria. Last A1C in the office was:  Lab Results  Component Value Date   HGBA1C 5.8 (H) 04/15/2023   Last GFR: Lab Results  Component Value Date   EGFR 101 04/15/2023   Patient is on Vitamin D supplement.   Lab Results  Component Value Date   VD25OH 45 04/15/2023   Current Medications:  Current Outpatient Medications on File Prior to Visit  Medication Sig Dispense Refill   Blood Glucose Monitoring Suppl (BLOOD GLUCOSE MONITOR SYSTEM) w/Device KIT 1 Device by Does not apply route in the morning, at noon, and at bedtime. 1 kit 0   busPIRone (BUSPAR) 5 MG tablet TAKE 1/2 TO 1 TABLET BY  MOUTH 3 TIMES A DAY FOR ANXIETY 270 tablet 0   cyanocobalamin 1000 MCG tablet Take 1 tablet (1,000 mcg total) by mouth daily. 100 tablet 0   glucose blood test strip Test blood sugar once daily or as directed 100 each 12   Lancets MISC Test blood sugar once daily or as directed 200 each 11   Budeson-Glycopyrrol-Formoterol (BREZTRI AEROSPHERE) 160-9-4.8 MCG/ACT AERO Inhale 2 puffs into the lungs 2 (two) times daily.     Budeson-Glycopyrrol-Formoterol (BREZTRI AEROSPHERE) 160-9-4.8 MCG/ACT AERO Inhale 2 puffs into the lungs in the morning and at bedtime. 5.9 g 0   No current facility-administered medications on file prior to visit.   Allergies:  No Known Allergies Medical History:  He has Vitamin D deficiency; Hypertension; Prediabetes; Medication management; Screening cholesterol level; Degenerative joint disease (DJD) of hip; BMI 35.0-35.9,adult; Pyelonephritis; Abdominal pain; Constipation; AKI (acute kidney injury) (HCC); Macrocytosis; Headache; Pleural effusion on right; Chronic bronchitis (HCC); and Cigarette smoker on their problem list.  Health Maintenance:   Immunization History  Administered Date(s) Administered   Influenza Split 10/24/2014   Influenza,inj,Quad PF,6+ Mos 09/24/2021, 09/09/2022   Influenza-Unspecified 09/20/2018, 09/23/2020   PFIZER(Purple Top)SARS-COV-2 Vaccination 03/21/2020, 04/11/2020, 11/23/2020   PNEUMOCOCCAL CONJUGATE-20 09/09/2022   PPD Test 10/24/2014, 12/06/2018, 12/03/2021   Pfizer Covid-19 Vaccine Bivalent Booster 53yrs & up 10/29/2021   Pneumococcal Conjugate-13 10/24/2014  Pneumococcal-Unspecified 10/20/2013   Tdap 10/20/2013   Zoster Recombinant(Shingrix) 09/24/2021, 04/13/2022   Health Maintenance  Topic Date Due   COVID-19 Vaccine (5 - 2023-24 season) 08/21/2022   INFLUENZA VACCINE  07/22/2023   DTaP/Tdap/Td (2 - Td or Tdap) 10/21/2023   Lung Cancer Screening  01/07/2024   Diabetic kidney evaluation - Urine ACR  01/15/2024   Diabetic  kidney evaluation - eGFR measurement  04/14/2024   Colonoscopy  08/25/2026   Hepatitis C Screening  Completed   HIV Screening  Completed   Zoster Vaccines- Shingrix  Completed   HPV VACCINES  Aged Out    Patient Care Team: Lucky Cowboy, MD as PCP - General (Internal Medicine)  Surgical History:  He has a past surgical history that includes Splenectomy (1994); Total hip arthroplasty; Knee arthroscopy with lateral menisectomy (Left, 09/01/2017); Arm surgery (Left); and Cystoscopy/ureteroscopy/holmium laser/stent placement (Left, 07/20/2022). Family History:  Hisfamily history includes Diabetes in his mother; Sleep apnea in his mother. Social History:  He reports that he quit smoking about 6 months ago. His smoking use included cigarettes. He started smoking about 40 years ago. He has a 20 pack-year smoking history. He has never used smokeless tobacco. He reports current alcohol use of about 8.0 standard drinks of alcohol per week. He reports that he does not use drugs.  Review of Systems: Review of Systems  Constitutional: Negative.   HENT: Negative.    Eyes: Negative.   Respiratory:  Positive for cough.   Cardiovascular: Negative.   Gastrointestinal: Negative.   Genitourinary: Negative.   Musculoskeletal: Negative.   Skin: Negative.   Neurological: Negative.   Endo/Heme/Allergies: Negative.   Psychiatric/Behavioral:  The patient is nervous/anxious.     Physical Exam: Estimated body mass index is 31.71 kg/m as calculated from the following:   Height as of this encounter: 6\' 2"  (1.88 m).   Weight as of this encounter: 247 lb (112 kg). BP (!) 142/86   Pulse (!) 50   Temp 98 F (36.7 C)   Ht 6\' 2"  (1.88 m)   Wt 247 lb (112 kg)   SpO2 98%   BMI 31.71 kg/m   General Appearance: Well nourished, obese, in no apparent distress.  Eyes: PERRLA, EOMs, conjunctiva no swelling or erythema, normal fundi and vessels.  Sinuses: No Frontal/maxillary tenderness  ENT/Mouth: Ext aud  canals clear, normal light reflex with TMs without erythema, bulging. Good dentition. No erythema, swelling, or exudate on post pharynx. Tonsils not swollen or erythematous. Hearing normal.  Neck: Supple, thyroid normal. No bruits  Respiratory: Respiratory effort normal, BS equal bilaterally without rales, rhonchi, wheezing or stridor.  Cardio: RRR without murmurs, rubs or gallops. Brisk peripheral pulses without edema.  Chest: symmetric, with normal excursions and percussion.  Abdomen: Soft, nontender, no guarding, rebound, hernias, masses, or organomegaly.  Lymphatics: Non tender without lymphadenopathy.  Musculoskeletal: Full ROM all peripheral extremities,5/5 strength, and normal gait.  Skin: Warm, dry without rashes, lesions, ecchymosis. Neuro: Cranial nerves intact, reflexes equal bilaterally. Normal muscle tone, no cerebellar symptoms. Sensation intact.  Psych: Awake and oriented X 3, normal affect, Insight and Judgment appropriate.    Adela Glimpse, NP 10:42 AM [facility]

## 2023-07-16 LAB — CBC WITH DIFFERENTIAL/PLATELET: Total Lymphocyte: 22.8 %

## 2023-08-20 ENCOUNTER — Encounter: Payer: Self-pay | Admitting: Internal Medicine

## 2023-08-20 ENCOUNTER — Ambulatory Visit: Payer: BC Managed Care – PPO | Admitting: Internal Medicine

## 2023-08-20 VITALS — BP 138/88 | HR 57 | Temp 97.9°F | Resp 16 | Ht 74.0 in | Wt 242.6 lb

## 2023-08-20 DIAGNOSIS — M7021 Olecranon bursitis, right elbow: Secondary | ICD-10-CM

## 2023-08-20 MED ORDER — DEXAMETHASONE 4 MG PO TABS: ORAL_TABLET | ORAL | 0 refills | Status: DC

## 2023-08-20 NOTE — Progress Notes (Unsigned)
     Future Appointments  Date Time Provider Department Center  08/20/2023 11:00 AM Lucky Cowboy, MD [facility] None  10/21/2023 11:00 AM Adela Glimpse, NP [facility] None  01/18/2024 10:00 AM Adela Glimpse, NP [facility] None    History of Present Illness:        Current Outpatient Medications on File Prior to Visit  Medication Sig   Blood Glucose Monitoring Suppl (BLOOD GLUCOSE MONITOR SYSTEM) w/Device KIT 1 Device by Does not apply route in the morning, at noon, and at bedtime.   busPIRone (BUSPAR) 5 MG tablet TAKE 1/2 TO 1 TABLET BY MOUTH 3 TIMES A DAY FOR ANXIETY   cyanocobalamin 1000 MCG tablet Take 1 tablet (1,000 mcg total) by mouth daily.   glucose blood test strip Test blood sugar once daily or as directed   Lancets MISC Test blood sugar once daily or as directed   losartan (COZAAR) 50 MG tablet Take 1 tablet (50 mg total) by mouth daily.   No current facility-administered medications on file prior to visit.    No Known Allergies   Problem list He has Vitamin D deficiency; Hypertension; Prediabetes; Medication management; Screening cholesterol level; Degenerative joint disease (DJD) of hip; BMI 35.0-35.9,adult; Pyelonephritis; Abdominal pain; Constipation; AKI (acute kidney injury) (HCC); Macrocytosis; Headache; Pleural effusion on right; Chronic bronchitis (HCC); and Cigarette smoker on their problem list.   Observations/Objective:  There were no vitals taken for this visit.  HEENT - WNL. Neck - supple.  Chest - Clear equal BS. Cor - Nl HS. RRR w/o sig MGR. PP 1(+). No edema. MS- FROM w/o deformities.  Gait Nl. Neuro -  Nl w/o focal abnormalities.   Assessment and Plan:      Follow Up Instructions:        I discussed the assessment and treatment plan with the patient. The patient was provided an opportunity to ask questions and all were answered. The patient agreed with the plan and demonstrated an understanding of the instructions.        The patient was advised to call back or seek an in-person evaluation if the symptoms worsen or if the condition fails to improve as anticipated.    Marinus Maw, MD

## 2023-09-29 ENCOUNTER — Other Ambulatory Visit: Payer: Self-pay | Admitting: Nurse Practitioner

## 2023-09-29 DIAGNOSIS — F419 Anxiety disorder, unspecified: Secondary | ICD-10-CM

## 2023-09-29 DIAGNOSIS — R454 Irritability and anger: Secondary | ICD-10-CM

## 2023-10-21 ENCOUNTER — Encounter: Payer: Self-pay | Admitting: Nurse Practitioner

## 2023-10-21 ENCOUNTER — Ambulatory Visit (INDEPENDENT_AMBULATORY_CARE_PROVIDER_SITE_OTHER): Payer: BC Managed Care – PPO | Admitting: Nurse Practitioner

## 2023-10-21 VITALS — BP 122/82 | HR 68 | Temp 98.3°F | Ht 74.0 in | Wt 250.4 lb

## 2023-10-21 DIAGNOSIS — Z23 Encounter for immunization: Secondary | ICD-10-CM | POA: Diagnosis not present

## 2023-10-21 DIAGNOSIS — I10 Essential (primary) hypertension: Secondary | ICD-10-CM

## 2023-10-21 DIAGNOSIS — R454 Irritability and anger: Secondary | ICD-10-CM

## 2023-10-21 DIAGNOSIS — E1159 Type 2 diabetes mellitus with other circulatory complications: Secondary | ICD-10-CM | POA: Diagnosis not present

## 2023-10-21 DIAGNOSIS — F419 Anxiety disorder, unspecified: Secondary | ICD-10-CM

## 2023-10-21 DIAGNOSIS — Z79899 Other long term (current) drug therapy: Secondary | ICD-10-CM

## 2023-10-21 DIAGNOSIS — D3502 Benign neoplasm of left adrenal gland: Secondary | ICD-10-CM

## 2023-10-21 DIAGNOSIS — F17200 Nicotine dependence, unspecified, uncomplicated: Secondary | ICD-10-CM

## 2023-10-21 DIAGNOSIS — N6322 Unspecified lump in the left breast, upper inner quadrant: Secondary | ICD-10-CM

## 2023-10-21 DIAGNOSIS — J439 Emphysema, unspecified: Secondary | ICD-10-CM | POA: Diagnosis not present

## 2023-10-21 DIAGNOSIS — E559 Vitamin D deficiency, unspecified: Secondary | ICD-10-CM

## 2023-10-21 DIAGNOSIS — Z6835 Body mass index (BMI) 35.0-35.9, adult: Secondary | ICD-10-CM

## 2023-10-21 NOTE — Patient Instructions (Signed)

## 2023-10-21 NOTE — Progress Notes (Signed)
General Follow Up  Assessment and Plan:  Primary hypertension Controlled Continue Losartan Discussed DASH (Dietary Approaches to Stop Hypertension) DASH diet is lower in sodium than a typical American diet. Cut back on foods that are high in saturated fat, cholesterol, and trans fats. Eat more whole-grain foods, fish, poultry, and nuts Remain active and exercise as tolerated daily.  Monitor BP at home-Call if greater than 130/80.  Check CMP/CBC  DM2 Controlled by lifestyle/diet  Discussed carb counting Education: Reviewed 'ABCs' of diabetes management  Discussed goals to be met and/or maintained include A1C (<7) Blood pressure (<130/80) Cholesterol (LDL <70) Continue Eye Exam yearly  Continue Dental Exam Q6 mo Discussed dietary recommendations Discussed Physical Activity recommendations Check A1C  Pulmonary emphysema, unspecified emphysema type (HCC)/Right Rib Pain Pulmonary Dr. Sherene Sires, [place] following Continue inhalers  Smoker Continuing to cut down on cigarettes. Smoking cessation instruction/counseling given:  counseled patient on the dangers of tobacco use, advised patient to stop smoking, and reviewed strategies to maximize success  Anxiety/Irritable Continue Buspar.   Reviewed relaxation techniques.  Sleep hygiene. Recommended Cognitive Behavioral Therapy (CBT) - defers. Recommended mindfulness meditation and exercise.   Encouraged personality growth wand development through coping techniques and problem-solving skills. Limit/Decrease/Monitor drug/alcohol intake.    Vitamin D deficiency Continue supplement Monitor levels  Medication management All medications discussed and reviewed in full. All questions and concerns regarding medications addressed.    BMI 35.0-35.9,adult Discussed appropriate BMI Diet modification. Physical activity. Encouraged/praised to build confidence.  Adenoma of left adrenal gland Continue to monitor  Left breast  mass Ultrasound and diagnostic mammogram ordered Continue to monitor  Orders Placed This Encounter  Procedures   MM 3D DIAGNOSTIC MAMMOGRAM BILATERAL BREAST    Irregular slightly raised firm non movable, nonpainful mass approximately 4 cm in shape located around 10 o'clock of left inner breast, left upper sternal border.    Standing Status:   Future    Standing Expiration Date:   10/20/2024    Order Specific Question:   Reason for Exam (SYMPTOM  OR DIAGNOSIS REQUIRED)    Answer:   abnormal breast exam/mass - male patient    Order Specific Question:   Preferred imaging location?    [facility]   [place] LIMITED ULTRASOUND INCLUDING AXILLA LEFT BREAST     Irregular slightly raised firm non movable, nonpainful mass approximately 4 cm in shape located around 10 o'clock of left inner breast, left upper sternal border.    Standing Status:   Future    Standing Expiration Date:   10/20/2024    Order Specific Question:   Reason for Exam (SYMPTOM  OR DIAGNOSIS REQUIRED)    Answer:   abnormal breast exam/mass - male patient    Order Specific Question:   Preferred imaging location?    Answer:   [facility]   Fluzone Trivalent Flu Vaccine ([place] dose preparattion)   CBC with Differential/Platelet   COMPLETE METABOLIC PANEL WITH GFR   Hemoglobin A1c   Notify office for further evaluation and treatment, questions or concerns if any reported s/s fail to improve.   The patient was advised to call back or seek an in-person evaluation if any symptoms worsen or if the condition fails to improve as anticipated.   Further disposition pending results of labs. Discussed med's effects and SE's.    I discussed the assessment and treatment plan with the patient. The patient was provided an opportunity to ask questions and all were answered. The patient agreed with the  plan and demonstrated an understanding of the instructions.  Discussed med's effects and SE's. Screening labs and tests as  requested with regular follow-up as recommended.  I provided 30 minutes of face-to-face time during this encounter including counseling, chart review, and critical decision making was preformed.    Future Appointments  Date Time Provider Department Center  10/21/2023 11:00 AM Adela Glimpse, NP [facility] None  01/18/2024 10:00 AM Adela Glimpse, NP [facility] None    HPI  57 y.o. male  presents for a general 3 month follow up. He has Vitamin D deficiency; Hypertension; Prediabetes; Medication management; Screening cholesterol level; Degenerative joint disease (DJD) of hip; BMI 35.0-35.9,adult; Pyelonephritis; Abdominal pain; Constipation; AKI (acute kidney injury) (HCC); Macrocytosis; Headache; Pleural effusion on right; Chronic bronchitis (HCC); and Cigarette smoker on their problem list.  He is concerned today for a left breast mass onset approximately 2 months ago when he first felt of the area that is located along the sternal border.  Denies pain, injury, fall, trauma.  Denies any family hx of breast cancer.  He does have hx of adenoma of left adrenal gland that was noted on pelvic CT Scan 08/2022.  Had MR liver 09/2022 revealed hepatic steatosis and scattered benign bilobar hepatic cysts.  Also noted 11 mm left adrenal  nodule that was noted to be benign and recommended no follow up imaging.  No suspicious renal mass.  Continues to be a current every day smoker but trying to cut down.  Shares he has been smoking 1-2 weekly.    He has been seen by Pulmonology was has inhaler but does not use them as directed.   He had a hospital encounter 10/05/2022 for nephrolithiasis, hematuria, pyelonephritis which resolved.  Ultrasound showed well circumcised anechoic lesion adjacent to the gallbladder   He did note lower abdomina pain during this time.  Noted to have an 1.7 cm adenoma of left adrenal gland that is currently being monitored. He completed an outpatient MRI revealed no suspicious hepatic  lesion, specifically no lesion oalong the gallbladder fossa to correspond with findings on prior US.    BMI is Body mass index is 32.15 kg/m., he is continuing to work on diet and exercise.  Almost cut out carbs completely from diet. Wt Readings from Last 3 Encounters:  10/21/23 250 lb 6.4 oz (113.6 kg)  08/20/23 242 lb 9.6 oz (110 kg)  07/15/23 247 lb (112 kg)   His blood pressure has been controlled at home, today their BP is BP: 122/82 He does not workout. He denies chest pain, shortness of breath, dizziness.   He is not on cholesterol medication and denies myalgias. His cholesterol is at goal. The cholesterol last visit was:   Lab Results  Component Value Date   CHOL 126 07/15/2023   HDL 64 07/15/2023   LDLCALC 44 07/15/2023   TRIG 99 07/15/2023   CHOLHDL 2.0 07/15/2023    He has not been working on diet and exercise for DM is, he is not on bASA, he is on ACE/ARB and denies polydipsia and polyuria. Last A1C in the office was:  Lab Results  Component Value Date   HGBA1C 6.1 (H) 07/15/2023   Last GFR: Lab Results  Component Value Date   EGFR 88 07/15/2023   Patient is on Vitamin D supplement.   Lab Results  Component Value Date   VD25OH 45 04/15/2023   Current Medications:  Current Outpatient Medications on File Prior to Visit  Medication Sig Dispense Refill  Blood Glucose Monitoring Suppl (BLOOD GLUCOSE MONITOR SYSTEM) w/Device KIT 1 Device by Does not apply route in the morning, at noon, and at bedtime. 1 kit 0   busPIRone (BUSPAR) 5 MG tablet TAKE 1/2 TO 1 TABLET BY MOUTH 3 TIMES A DAY FOR ANXIETY 270 tablet 0   cyanocobalamin 1000 MCG tablet Take 1 tablet (1,000 mcg total) by mouth daily. 100 tablet 0   glucose blood test strip Test blood sugar once daily or as directed 100 each 12   Lancets MISC Test blood sugar once daily or as directed 200 each 11   losartan (COZAAR) 50 MG tablet Take 1 tablet (50 mg total) by mouth daily. 90 tablet 2   dexamethasone (DECADRON)  4 MG tablet Take 1 tab 3 x day - 3 days, then 2 x day - 3 days, then 1 tab daily 20 tablet 0   No current facility-administered medications on file prior to visit.   Allergies:  Not on File Medical History:  He has Vitamin D deficiency; Hypertension; Prediabetes; Medication management; Screening cholesterol level; Degenerative joint disease (DJD) of hip; BMI 35.0-35.9,adult; Pyelonephritis; Abdominal pain; Constipation; AKI (acute kidney injury) (HCC); Macrocytosis; Headache; Pleural effusion on right; Chronic bronchitis (HCC); and Cigarette smoker on their problem list.  Health Maintenance:   Immunization History  Administered Date(s) Administered   Influenza Split 10/24/2014   Influenza,inj,Quad PF,6+ Mos 09/24/2021, 09/09/2022   Influenza-Unspecified 09/20/2018, 09/23/2020   PFIZER(Purple Top)SARS-COV-2 Vaccination 03/21/2020, 04/11/2020, 11/23/2020   PNEUMOCOCCAL CONJUGATE-20 09/09/2022   PPD Test 10/24/2014, 12/06/2018, 12/03/2021   Pfizer Covid-19 Vaccine Bivalent Booster 63yrs & up 10/29/2021   Pneumococcal Conjugate-13 10/24/2014   Pneumococcal-Unspecified 10/20/2013   Tdap 10/20/2013   Zoster Recombinant(Shingrix) 09/24/2021, 04/13/2022   Health Maintenance  Topic Date Due   INFLUENZA VACCINE  07/22/2023   COVID-19 Vaccine (5 - 2023-24 season) 08/22/2023   DTaP/Tdap/Td (2 - Td or Tdap) 10/21/2023   Lung Cancer Screening  01/07/2024   Diabetic kidney evaluation - Urine ACR  01/15/2024   Diabetic kidney evaluation - eGFR measurement  07/14/2024   Colonoscopy  08/25/2026   Hepatitis C Screening  Completed   HIV Screening  Completed   Zoster Vaccines- Shingrix  Completed   HPV VACCINES  Aged Out    Patient Care Team: Lucky Cowboy, MD as PCP - General (Internal Medicine)  Surgical History:  He has a past surgical history that includes Splenectomy (1994); Total hip arthroplasty; Knee arthroscopy with lateral menisectomy (Left, 09/01/2017); Arm surgery (Left); and  Cystoscopy/ureteroscopy/holmium laser/stent placement (Left, 07/20/2022). Family History:  Hisfamily history includes Diabetes in his mother; Sleep apnea in his mother. Social History:  He reports that he quit smoking about 9 months ago. His smoking use included cigarettes. He started smoking about 40 years ago. He has a 20 pack-year smoking history. He has never used smokeless tobacco. He reports current alcohol use of about 8.0 standard drinks of alcohol per week. He reports that he does not use drugs.  Review of Systems: Review of Systems  Constitutional: Negative.   HENT: Negative.    Eyes: Negative.   Respiratory:  Positive for cough.   Cardiovascular: Negative.   Gastrointestinal: Negative.   Genitourinary: Negative.   Musculoskeletal: Negative.   Skin: Negative.   Neurological: Negative.   Endo/Heme/Allergies: Negative.   Psychiatric/Behavioral:  The patient is nervous/anxious.     Physical Exam: Estimated body mass index is 32.15 kg/m as calculated from the following:   Height as of this encounter: 6\' 2"  (1.88  m).   Weight as of this encounter: 250 lb 6.4 oz (113.6 kg). BP 122/82   Pulse 68   Temp 98.3 F (36.8 C)   Ht 6\' 2"  (1.88 m)   Wt 250 lb 6.4 oz (113.6 kg)   SpO2 98%   BMI 32.15 kg/m   General Appearance: Well nourished, obese, in no apparent distress.  Eyes: PERRLA, EOMs, conjunctiva no swelling or erythema, normal fundi and vessels.  Sinuses: No Frontal/maxillary tenderness  ENT/Mouth: Ext aud canals clear, normal light reflex with TMs without erythema, bulging. Good dentition. No erythema, swelling, or exudate on post pharynx. Tonsils not swollen or erythematous. Hearing normal.  Neck: Supple, thyroid normal. No bruits  Respiratory: Respiratory effort normal, BS equal bilaterally without rales, rhonchi, wheezing or stridor.  Cardio: RRR without murmurs, rubs or gallops. Brisk peripheral pulses without edema.  Chest: symmetric, with normal excursions and  percussion.  Abdomen: Soft, nontender, no guarding, rebound, hernias, masses, or organomegaly.  Lymphatics: Non tender without lymphadenopathy.  Musculoskeletal: Full ROM all peripheral extremities,5/5 strength, and normal gait.  Skin: Warm, dry without rashes, lesions, ecchymosis. Neuro: Cranial nerves intact, reflexes equal bilaterally. Normal muscle tone, no cerebellar symptoms. Sensation intact.  Psych: Awake and oriented X 3, normal affect, Insight and Judgment appropriate.  Breast:  Irregular slightly raised firm non movable, nonpainful mass approximately 4 cm in shape located around 10 o'clock of left inner breast, left upper sternal border.     Adela Glimpse, NP 10:53 AM [facility]

## 2023-10-22 LAB — CBC WITH DIFFERENTIAL/PLATELET
Absolute Lymphocytes: 2423 {cells}/uL (ref 850–3900)
Absolute Monocytes: 979 {cells}/uL — ABNORMAL HIGH (ref 200–950)
Basophils Absolute: 76 {cells}/uL (ref 0–200)
Basophils Relative: 0.8 %
Eosinophils Absolute: 665 {cells}/uL — ABNORMAL HIGH (ref 15–500)
Eosinophils Relative: 7 %
HCT: 48.9 % (ref 38.5–50.0)
Hemoglobin: 16.2 g/dL (ref 13.2–17.1)
MCH: 34.9 pg — ABNORMAL HIGH (ref 27.0–33.0)
MCHC: 33.1 g/dL (ref 32.0–36.0)
MCV: 105.4 fL — ABNORMAL HIGH (ref 80.0–100.0)
MPV: 10.8 fL (ref 7.5–12.5)
Monocytes Relative: 10.3 %
Neutro Abs: 5358 {cells}/uL (ref 1500–7800)
Neutrophils Relative %: 56.4 %
Platelets: 328 10*3/uL (ref 140–400)
RBC: 4.64 10*6/uL (ref 4.20–5.80)
RDW: 11.6 % (ref 11.0–15.0)
Total Lymphocyte: 25.5 %
WBC: 9.5 10*3/uL (ref 3.8–10.8)

## 2023-10-22 LAB — HEMOGLOBIN A1C
Hgb A1c MFr Bld: 6.2 %{Hb} — ABNORMAL HIGH (ref <5.7)
Mean Plasma Glucose: 131 mg/dL
eAG (mmol/L): 7.3 mmol/L

## 2023-10-22 LAB — COMPLETE METABOLIC PANEL WITH GFR
AG Ratio: 1.2 (calc) (ref 1.0–2.5)
ALT: 14 U/L (ref 9–46)
AST: 15 U/L (ref 10–35)
Albumin: 3.7 g/dL (ref 3.6–5.1)
Alkaline phosphatase (APISO): 99 U/L (ref 35–144)
BUN: 19 mg/dL (ref 7–25)
CO2: 33 mmol/L — ABNORMAL HIGH (ref 20–32)
Calcium: 9.3 mg/dL (ref 8.6–10.3)
Chloride: 104 mmol/L (ref 98–110)
Creat: 0.88 mg/dL (ref 0.70–1.30)
Globulin: 3 g/dL (ref 1.9–3.7)
Glucose, Bld: 102 mg/dL — ABNORMAL HIGH (ref 65–99)
Potassium: 4.9 mmol/L (ref 3.5–5.3)
Sodium: 143 mmol/L (ref 135–146)
Total Bilirubin: 0.5 mg/dL (ref 0.2–1.2)
Total Protein: 6.7 g/dL (ref 6.1–8.1)
eGFR: 100 mL/min/{1.73_m2} (ref >=60)

## 2023-10-26 ENCOUNTER — Telehealth: Payer: Self-pay | Admitting: Nurse Practitioner

## 2023-10-26 NOTE — Telephone Encounter (Signed)
Results given.

## 2023-10-26 NOTE — Telephone Encounter (Signed)
 Called back for lab results

## 2023-11-11 ENCOUNTER — Ambulatory Visit
Admission: RE | Admit: 2023-11-11 | Discharge: 2023-11-11 | Disposition: A | Discharge status: Home / Self Care | Payer: BC Managed Care – PPO | Source: Ambulatory Visit | Attending: Nurse Practitioner | Admitting: Nurse Practitioner

## 2023-11-11 DIAGNOSIS — N6322 Unspecified lump in the left breast, upper inner quadrant: Secondary | ICD-10-CM

## 2023-12-17 ENCOUNTER — Encounter: Payer: BC Managed Care – PPO | Admitting: Internal Medicine

## 2024-01-18 ENCOUNTER — Encounter: Payer: Self-pay | Admitting: Nurse Practitioner

## 2024-01-24 ENCOUNTER — Encounter: Payer: Self-pay | Admitting: Nurse Practitioner

## 2024-02-01 ENCOUNTER — Encounter: Payer: Self-pay | Admitting: *Deleted

## 2024-04-17 ENCOUNTER — Telehealth: Payer: Self-pay | Admitting: Internal Medicine

## 2024-04-17 DIAGNOSIS — I10 Essential (primary) hypertension: Secondary | ICD-10-CM

## 2024-04-17 NOTE — Telephone Encounter (Signed)
 Prescription Request Patient was a patient of Dr Alica Antu, has appt with our office End of may please advise if we may refill the following.  04/17/2024  LOV: 08/20/2023  What is the name of the medication or equipment?  losartan  (COZAAR ) 50 MG tablet    Have you contacted your pharmacy to request a refill? Yes   Which pharmacy would you like this sent to?  [id number] [place], [place] - [address] [place] [address] Phone: [phone] Fax: [phone]    Patient notified that their request is being sent to the clinical staff for review and that they should receive a response within 2 business days.   Please advise at Mobile [phone] (mobile)

## 2024-04-18 MED ORDER — LOSARTAN POTASSIUM 50 MG PO TABS: 50.0000 mg | ORAL_TABLET | Freq: Every day | ORAL | 0 refills | Status: DC

## 2024-04-18 NOTE — Telephone Encounter (Signed)
 Please advise pt I sent in refill for his losartan 

## 2024-04-18 NOTE — Addendum Note (Signed)
 Addended by: Felicita Horns on: 04/18/2024 09:37 AM   Modules accepted: Orders

## 2024-04-18 NOTE — Telephone Encounter (Signed)
 Spoke with pt's wife, Edwina Gram. She is aware that the pt's prescription has been sent in.

## 2024-05-17 ENCOUNTER — Other Ambulatory Visit: Payer: Self-pay | Admitting: Family

## 2024-05-17 ENCOUNTER — Ambulatory Visit: Payer: Self-pay | Admitting: Family

## 2024-05-17 ENCOUNTER — Encounter: Payer: Self-pay | Admitting: Family

## 2024-05-17 VITALS — BP 162/78 | HR 61 | Temp 97.8°F | Ht 73.5 in | Wt 248.0 lb

## 2024-05-17 DIAGNOSIS — E559 Vitamin D deficiency, unspecified: Secondary | ICD-10-CM | POA: Diagnosis not present

## 2024-05-17 DIAGNOSIS — Z125 Encounter for screening for malignant neoplasm of prostate: Secondary | ICD-10-CM | POA: Diagnosis not present

## 2024-05-17 DIAGNOSIS — D72818 Other decreased white blood cell count: Secondary | ICD-10-CM | POA: Diagnosis not present

## 2024-05-17 DIAGNOSIS — Z23 Encounter for immunization: Secondary | ICD-10-CM

## 2024-05-17 DIAGNOSIS — I10 Essential (primary) hypertension: Secondary | ICD-10-CM | POA: Diagnosis not present

## 2024-05-17 DIAGNOSIS — Z13220 Encounter for screening for lipoid disorders: Secondary | ICD-10-CM | POA: Diagnosis not present

## 2024-05-17 DIAGNOSIS — R7303 Prediabetes: Secondary | ICD-10-CM

## 2024-05-17 DIAGNOSIS — Z5320 Procedure and treatment not carried out because of patient's decision for unspecified reasons: Secondary | ICD-10-CM | POA: Insufficient documentation

## 2024-05-17 DIAGNOSIS — Z720 Tobacco use: Secondary | ICD-10-CM | POA: Diagnosis not present

## 2024-05-17 DIAGNOSIS — R222 Localized swelling, mass and lump, trunk: Secondary | ICD-10-CM

## 2024-05-17 DIAGNOSIS — K760 Fatty (change of) liver, not elsewhere classified: Secondary | ICD-10-CM | POA: Insufficient documentation

## 2024-05-17 DIAGNOSIS — I83812 Varicose veins of left lower extremities with pain: Secondary | ICD-10-CM

## 2024-05-17 DIAGNOSIS — D3502 Benign neoplasm of left adrenal gland: Secondary | ICD-10-CM | POA: Insufficient documentation

## 2024-05-17 DIAGNOSIS — N5089 Other specified disorders of the male genital organs: Secondary | ICD-10-CM | POA: Insufficient documentation

## 2024-05-17 DIAGNOSIS — K429 Umbilical hernia without obstruction or gangrene: Secondary | ICD-10-CM | POA: Insufficient documentation

## 2024-05-17 DIAGNOSIS — Z9081 Acquired absence of spleen: Secondary | ICD-10-CM | POA: Insufficient documentation

## 2024-05-17 DIAGNOSIS — Z87442 Personal history of urinary calculi: Secondary | ICD-10-CM | POA: Insufficient documentation

## 2024-05-17 DIAGNOSIS — F419 Anxiety disorder, unspecified: Secondary | ICD-10-CM

## 2024-05-17 DIAGNOSIS — I8393 Asymptomatic varicose veins of bilateral lower extremities: Secondary | ICD-10-CM | POA: Insufficient documentation

## 2024-05-17 LAB — COMPREHENSIVE METABOLIC PANEL WITH GFR
ALT: 13 U/L (ref 0–53)
AST: 15 U/L (ref 0–37)
Albumin: 4.1 g/dL (ref 3.5–5.2)
Alkaline Phosphatase: 101 U/L (ref 39–117)
BUN: 14 mg/dL (ref 6–23)
CO2: 30 meq/L (ref 19–32)
Calcium: 9.1 mg/dL (ref 8.4–10.5)
Chloride: 103 meq/L (ref 96–112)
Creatinine, Ser: 0.89 mg/dL (ref 0.40–1.50)
GFR: 94.28 mL/min (ref >60.00)
Glucose, Bld: 96 mg/dL (ref 70–99)
Potassium: 3.9 meq/L (ref 3.5–5.1)
Sodium: 140 meq/L (ref 135–145)
Total Bilirubin: 0.8 mg/dL (ref 0.2–1.2)
Total Protein: 7 g/dL (ref 6.0–8.3)

## 2024-05-17 LAB — CBC WITH DIFFERENTIAL/PLATELET
Basophils Absolute: 0 10*3/uL (ref 0.0–0.1)
Basophils Relative: 0.6 % (ref 0.0–3.0)
Eosinophils Absolute: 0.2 10*3/uL (ref 0.0–0.7)
Eosinophils Relative: 1.9 % (ref 0.0–5.0)
HCT: 51.3 % (ref 39.0–52.0)
Hemoglobin: 16.9 g/dL (ref 13.0–17.0)
Lymphocytes Relative: 16.3 % (ref 12.0–46.0)
Lymphs Abs: 1.4 10*3/uL (ref 0.7–4.0)
MCHC: 33 g/dL (ref 30.0–36.0)
MCV: 106.4 fl — ABNORMAL HIGH (ref 78.0–100.0)
Monocytes Absolute: 0.9 10*3/uL (ref 0.1–1.0)
Monocytes Relative: 9.9 % (ref 3.0–12.0)
Neutro Abs: 6.3 10*3/uL (ref 1.4–7.7)
Neutrophils Relative %: 71.3 % (ref 43.0–77.0)
Platelets: 249 10*3/uL (ref 150.0–400.0)
RBC: 4.82 Mil/uL (ref 4.22–5.81)
RDW: 15.6 % — ABNORMAL HIGH (ref 11.5–15.5)
WBC: 8.8 10*3/uL (ref 4.0–10.5)

## 2024-05-17 LAB — HEMOGLOBIN A1C: Hgb A1c MFr Bld: 5.8 % (ref 4.6–6.5)

## 2024-05-17 LAB — TSH: TSH: 1.86 u[IU]/mL (ref 0.35–5.50)

## 2024-05-17 LAB — MICROALBUMIN / CREATININE URINE RATIO
Creatinine,U: 151 mg/dL
Microalb Creat Ratio: 67.7 mg/g — ABNORMAL HIGH (ref 0.0–30.0)
Microalb, Ur: 10.2 mg/dL — ABNORMAL HIGH (ref 0.0–1.9)

## 2024-05-17 LAB — LIPID PANEL
Cholesterol: 116 mg/dL (ref 0–200)
HDL: 68.3 mg/dL (ref >39.00)
LDL Cholesterol: 32 mg/dL (ref 0–99)
NonHDL: 48.14
Total CHOL/HDL Ratio: 2
Triglycerides: 79 mg/dL (ref 0.0–149.0)
VLDL: 15.8 mg/dL (ref 0.0–40.0)

## 2024-05-17 LAB — VITAMIN D 25 HYDROXY (VIT D DEFICIENCY, FRACTURES): VITD: 23.72 ng/mL — ABNORMAL LOW (ref 30.00–100.00)

## 2024-05-17 LAB — PSA: PSA: 0.36 ng/mL (ref 0.10–4.00)

## 2024-05-17 NOTE — Progress Notes (Signed)
 New Patient Office Visit  Subjective:  Patient ID: Erik Quinn, male    DOB: Jan 30, 1966  Age: 58 y.o. MRN: [id number]  CC:  Chief Complaint  Patient presents with   Establish Care   Groin Swelling    One larger than the other for a few years     HPI [name] is here to establish care as a new patient.  Oriented to practice routines and expectations.  Prior provider was:  Pt is with acute concerns.  Has one testicle bigger than the other ongoing since December 2023.  It is not painful. It has not enlarged per his recollection, but also is not decreasing in size.    chronic concerns:  HTN: doesn't really check his BP at home. Losartan  takes daily at 50 mg once daily.   Anxiety: on buspirone  5 mg takes 2-3 times daily depending on the day   U/s well circumcised anechoic lesion adjacent to the gallbladder, MRI hepatic protocol with contrast showed fatty liver, benign 11 mm left adrenal adeonoma (no f/u needed per radiology report) CT abd pelvis with normal gallbladder anatomy, scattered benign hepatic cysts   Left upper chest lump above the breast , mammogram diag negative, u/s also showed the same.   S/p thoracentesis: CXR had showed right pleural effusion, 650 cc fluid removed.  Had been referred to pulmonologist for cough/ratter that had been going on for weeks with CT chest showing right pleural effusion. He states still at times does feel when he is lying down on the right side and he breathes he feels he is 'scraping something'. He is a smoker,   Lab Results  Component Value Date   HGBA1C 5.8 05/17/2024   Splenomegaly, completely removed.   Left varicose veins, at times painful, typically bulging.      ROS: Negative unless specifically indicated above in HPI.   Current Outpatient Medications:    Blood Glucose Monitoring Suppl (BLOOD GLUCOSE MONITOR SYSTEM) w/Device KIT, 1 Device by Does not apply route in the morning, at noon, and at bedtime., Disp: 1 kit,  Rfl: 0   busPIRone  (BUSPAR ) 5 MG tablet, TAKE 1/2 TO 1 TABLET BY MOUTH 3 TIMES A DAY FOR ANXIETY, Disp: 270 tablet, Rfl: 0   cyanocobalamin  1000 MCG tablet, Take 1 tablet (1,000 mcg total) by mouth daily., Disp: 100 tablet, Rfl: 0   glucose blood test strip, Test blood sugar once daily or as directed, Disp: 100 each, Rfl: 12   Lancets MISC, Test blood sugar once daily or as directed, Disp: 200 each, Rfl: 11   losartan  (COZAAR ) 50 MG tablet, Take 1 tablet (50 mg total) by mouth daily., Disp: 90 tablet, Rfl: 0   Cholecalciferol  1.25 MG (50000 UT) TABS, Take 1 tablet by mouth once a week., Disp: 8 tablet, Rfl: 0 Past Medical History:  Diagnosis Date   GERD (gastroesophageal reflux disease)    History of kidney stones    Hypertension    Pt denies   Pre-diabetes    Past Surgical History:  Procedure Laterality Date   Arm surgery Left    CYSTOSCOPY/URETEROSCOPY/HOLMIUM LASER/STENT PLACEMENT Left 07/20/2022   Procedure: CYSTOSCOPY/URETEROSCOPY/HOLMIUM LASER/STENT PLACEMENT;  Surgeon: Lahoma Pigg, MD;  Location: [facility];  Service: Urology;  Laterality: Left;  ONLY NEEDS 45 MIN   KNEE ARTHROSCOPY WITH LATERAL MENISECTOMY Left 09/01/2017   Procedure: ARTHROSCOPY LEFT KNEE, DEBRIDEMENT OF CHONDROMALACIA;  Surgeon: Wendolyn Hamburger, MD;  Location: [facility];  Service: Orthopedics;  Laterality: Left;  SPLENECTOMY  1994   TOTAL HIP ARTHROPLASTY Bilateral     Objective:   Today's Vitals: BP (!) 162/78   Pulse 61   Temp 97.8 F (36.6 C) (Temporal)   Ht 6' 1.5" (1.867 m)   Wt 248 lb (112.5 kg)   SpO2 98%   BMI 32.28 kg/m   Physical Exam Constitutional:      General: He is not in acute distress.    Appearance: Normal appearance. He is normal weight. He is not ill-appearing, toxic-appearing or diaphoretic.  Cardiovascular:     Rate and Rhythm: Normal rate and regular rhythm.     Comments: Left lower ext varicosities bulging Pulmonary:     Effort: Pulmonary effort is normal.      Breath sounds: Normal breath sounds.  Musculoskeletal:        General: Normal range of motion.     Right lower leg: No edema.     Left lower leg: No edema.  Neurological:     General: No focal deficit present.     Mental Status: He is alert and oriented to person, place, and time. Mental status is at baseline.  Psychiatric:        Mood and Affect: Mood normal.        Behavior: Behavior normal.        Thought Content: Thought content normal.        Judgment: Judgment normal.     Assessment & Plan:  History of nephrolithiasis  Adrenal adenoma, left  Umbilical hernia without obstruction and without gangrene  Mass of left chest wall Assessment & Plan: Reviewed mammogram and u/s  Stable appearing.  Advised pt to advise if any growth and or tenderness or new sx to assess and we will evaluate further.    Low monocyte count -     CBC with Differential/Platelet  Fatty liver Assessment & Plan: Continue to monitor lfts  Pt advised to work on diet and exercise as tolerated    Tobacco abuse Assessment & Plan: Smoking cessation instruction/counseling given:  counseled patient on the dangers of tobacco use, advised patient to stop smoking, and reviewed strategies to maximize success    Vitamin D  deficiency -     VITAMIN D  25 Hydroxy (Vit-D Deficiency, Fractures)  Screening for prostate cancer -     PSA  H/O splenectomy  Scrotum swelling Assessment & Plan: U/s scrotum ordered pending results Considering pending results referral to urology  Orders: -     US  SCROTUM W/DOPPLER; Future  Prediabetes Assessment & Plan: Pt advised of the following: Work on a diabetic diet, try to incorporate exercise at least 20-30 a day for 3 days a week or more.    Orders: -     Hemoglobin A1c -     Comprehensive metabolic panel with GFR  Primary hypertension Assessment & Plan: Elevated today on exam Pt advised of the following:  Continue medication as prescribed. Monitor  blood pressure periodically and/or when you feel symptomatic. Goal is <130/90 on average. Ensure that you have rested for 30 minutes prior to checking your blood pressure. Record your readings and bring them to your next visit if necessary.work on a low sodium diet.  Continue losartan  50 mg once daily   Orders: -     Microalbumin / creatinine urine ratio -     TSH  Screening for lipid disorders  Screening for lipoid disorders -     Lipid panel  Lung cancer screening declined by patient  Varicose veins of left lower extremity with pain Assessment & Plan: Referral to vascular surgery to evaluate  Orders: -     Ambulatory referral to Vascular Surgery  Need for Tdap vaccination -     Tdap vaccine greater than or equal to 7yo IM  Anxiety    Follow-up: Return in about 3 weeks (around 06/07/2024) for f/u blood pressure.   Felicita Horns, FNP

## 2024-05-17 NOTE — Assessment & Plan Note (Signed)
 Elevated today on exam Pt advised of the following:  Continue medication as prescribed. Monitor blood pressure periodically and/or when you feel symptomatic. Goal is <130/90 on average. Ensure that you have rested for 30 minutes prior to checking your blood pressure. Record your readings and bring them to your next visit if necessary.work on a low sodium diet.  Continue losartan  50 mg once daily

## 2024-05-17 NOTE — Patient Instructions (Signed)
   Your imaging for an ultrasound of your scrotum  Has been ordered at the following location.  Please call to schedule a time and date that would work for you.    [address] [place] Phone [phone],  8-430 pm

## 2024-05-18 ENCOUNTER — Ambulatory Visit: Payer: Self-pay | Admitting: Family

## 2024-05-18 DIAGNOSIS — E559 Vitamin D deficiency, unspecified: Secondary | ICD-10-CM

## 2024-05-18 DIAGNOSIS — R809 Proteinuria, unspecified: Secondary | ICD-10-CM

## 2024-05-18 DIAGNOSIS — N433 Hydrocele, unspecified: Secondary | ICD-10-CM

## 2024-05-18 MED ORDER — CHOLECALCIFEROL 1.25 MG (50000 UT) PO TABS: 1.0000 | ORAL_TABLET | ORAL | 0 refills | Status: AC

## 2024-05-18 NOTE — Progress Notes (Signed)
 Your vitamin D  was a bit on the lower range. I will send an RX for vitamin D3 50,000 IU which you will take once weekly for 8 weeks. Once RX is complete, please continue over the counter Vitamin D3 2000 IU once daily.   Your urine microalbumin is positive and pretty elevated.  This can indicate early stress on the kidneys, thankfully you are on losartan  which helps protect the kidneys from any future damage however there is quite an elevation so I would suggest coming by the office in the next 2 weeks and repeating this level.  I will put the order in for the urine and you can just come by the office and drop off a urine sample when you are able.  Try not to overexert yourself or exercise 2 days prior to dropping off the urine as this could cause false elevations.  Prostate level is normal range.  You are prediabetic but there has been an improvement from 7 months ago your number is now 5.8 and it was 6.2.  Your cholesterol looks absolutely beautiful keep up the good work

## 2024-05-19 ENCOUNTER — Ambulatory Visit
Admission: RE | Admit: 2024-05-19 | Discharge: 2024-05-19 | Disposition: A | Discharge status: Home / Self Care | Source: Ambulatory Visit | Attending: Family | Admitting: Family

## 2024-05-19 DIAGNOSIS — N5089 Other specified disorders of the male genital organs: Secondary | ICD-10-CM

## 2024-05-21 DIAGNOSIS — I83812 Varicose veins of left lower extremities with pain: Secondary | ICD-10-CM | POA: Insufficient documentation

## 2024-05-21 DIAGNOSIS — F419 Anxiety disorder, unspecified: Secondary | ICD-10-CM | POA: Insufficient documentation

## 2024-05-21 NOTE — Assessment & Plan Note (Signed)
 Reviewed mammogram and u/s  Stable appearing.  Advised pt to advise if any growth and or tenderness or new sx to assess and we will evaluate further.

## 2024-05-21 NOTE — Assessment & Plan Note (Signed)
 Referral to vascular surgery to evaluate

## 2024-05-21 NOTE — Assessment & Plan Note (Signed)
 U/s scrotum ordered pending results Considering pending results referral to urology

## 2024-05-21 NOTE — Assessment & Plan Note (Signed)
 Continue to monitor lfts  Pt advised to work on diet and exercise as tolerated

## 2024-05-21 NOTE — Assessment & Plan Note (Signed)
 Smoking cessation instruction/counseling given:  counseled patient on the dangers of tobacco use, advised patient to stop smoking, and reviewed strategies to maximize success

## 2024-05-21 NOTE — Assessment & Plan Note (Signed)
 Pt advised of the following: Work on a diabetic diet, try to incorporate exercise at least 20-30 a day for 3 days a week or more.

## 2024-05-22 NOTE — Progress Notes (Signed)
 Thankfully no acute findings however there is a noted hydrocele.  Since this has persisted greater than 12 months I am going to refer you to urology because this typically requires potential surgical evaluation. At times there can be an underlying hernia or something that is necessary to evaluate for to avoid future complications. I am placing the referral,  Please let us  know if you have not heard back within 2 weeks about the referral.   ------------------------------------ What is a hydrocele?    This is a type of swelling in the scrotum, the pouch of skin that holds the testicles. This swelling happens when fluid collects in the thin sac that surrounds a testicle.

## 2024-05-31 ENCOUNTER — Other Ambulatory Visit (INDEPENDENT_AMBULATORY_CARE_PROVIDER_SITE_OTHER)

## 2024-05-31 DIAGNOSIS — R809 Proteinuria, unspecified: Secondary | ICD-10-CM | POA: Diagnosis not present

## 2024-05-31 LAB — MICROALBUMIN / CREATININE URINE RATIO
Creatinine,U: 79.8 mg/dL
Microalb Creat Ratio: 33.7 mg/g — ABNORMAL HIGH (ref 0.0–30.0)
Microalb, Ur: 2.7 mg/dL — ABNORMAL HIGH (ref 0.0–1.9)

## 2024-06-01 ENCOUNTER — Ambulatory Visit: Payer: Self-pay | Admitting: Family

## 2024-06-01 DIAGNOSIS — I10 Essential (primary) hypertension: Secondary | ICD-10-CM

## 2024-06-07 ENCOUNTER — Encounter: Payer: Self-pay | Admitting: Family

## 2024-06-07 ENCOUNTER — Ambulatory Visit (INDEPENDENT_AMBULATORY_CARE_PROVIDER_SITE_OTHER): Admitting: Family

## 2024-06-07 VITALS — BP 154/94 | HR 67 | Temp 98.1°F | Ht 73.5 in | Wt 248.2 lb

## 2024-06-07 DIAGNOSIS — I10 Essential (primary) hypertension: Secondary | ICD-10-CM

## 2024-06-07 DIAGNOSIS — I8393 Asymptomatic varicose veins of bilateral lower extremities: Secondary | ICD-10-CM | POA: Diagnosis not present

## 2024-06-07 DIAGNOSIS — E559 Vitamin D deficiency, unspecified: Secondary | ICD-10-CM

## 2024-06-07 DIAGNOSIS — R001 Bradycardia, unspecified: Secondary | ICD-10-CM | POA: Insufficient documentation

## 2024-06-07 DIAGNOSIS — F17210 Nicotine dependence, cigarettes, uncomplicated: Secondary | ICD-10-CM

## 2024-06-07 MED ORDER — AMLODIPINE BESYLATE 5 MG PO TABS: 5.0000 mg | ORAL_TABLET | Freq: Every day | ORAL | 0 refills | Status: DC

## 2024-06-07 NOTE — Assessment & Plan Note (Addendum)
 Baseline EKG today bradycardia possible left atrial enlargement hr 50 Average too high, will start amlodipine 5 mg once daily and continue losartan  50 mg once daily Urine m/a positive continue losartan .

## 2024-06-07 NOTE — Assessment & Plan Note (Signed)
 Advised to continue RX dosing then once complete start with otc vitamin D3 2000 I/U once daily

## 2024-06-07 NOTE — Assessment & Plan Note (Signed)
 Noted on EKG .  Pt otherwise asymptomatic.  Will order echocardiogram pending results.

## 2024-06-07 NOTE — Assessment & Plan Note (Signed)
 Pt to follow with vascular surgery for eval

## 2024-06-07 NOTE — Patient Instructions (Signed)
  I am ordering an echocardiogram which is an ultrasound of your heart.  At [facility] long.  Please let us  know if you have not heard back within 2 weeks about the referral.  Start amlodipine 5 mg once daily.  Continue losartan  5 mg once daily    ------------------------------------  Start monitoring your blood pressure daily, around the same time of day, for the next 2-3 weeks.  Ensure that you have rested for 30 minutes prior to checking your blood pressure. Record your readings and bring them to your next visit.  ------------------------------------

## 2024-06-07 NOTE — Assessment & Plan Note (Addendum)
 Smoking cessation instruction/counseling given:  counseled patient on the dangers of tobacco use, advised patient to stop smoking, and reviewed strategies to maximize success  Again encouraged lung screening pt declined.

## 2024-06-07 NOTE — Progress Notes (Signed)
 Established Patient Office Visit  Subjective:   Patient ID: Erik Quinn, male    DOB: 30-Jul-1966  Age: 58 y.o. MRN: [id number]  CC:  Chief Complaint  Patient presents with   Hypertension    HPI: Glyn Turpen is a 58 y.o. male presenting on 06/07/2024 for Hypertension  HTN: has checked his blood pressure at home and average is lowest 123/104 and highest 183/114. Denies cp palp and or sob. On losartan  currently 50 mg for his blood pressure.   Urine microalbumin positive, pt is on losartan .   Vitamin d  def: taking RX once weekly pill.     mMRC dyspnea scale, asked in office, on a scale 0-4  0 I only get breathless with strenuous exercise  1 I get short of breath when hurrying on level ground or walking up a slight hill  2 on level ground, I walk slower than people of the same age because of breathlessness      or have to stop for breath when walking my own pace  3 I stop for breath after walking about 100 yards or after a few minutes on level ground  4 I am too breathless to lave the house or I am breathless when dressing   Have you had 2 or more moderate exacerbations or at least 1 hospitalization for COPD exacerbation in the past year? No   Does the patient have high peripheral eosinophil levels (>300 ): unknown, will order today.        ROS: Negative unless specifically indicated above in HPI.   Relevant past medical history reviewed and updated as indicated.   Allergies and medications reviewed and updated.   Current Outpatient Medications:    amLODipine (NORVASC) 5 MG tablet, Take 1 tablet (5 mg total) by mouth daily., Disp: 90 tablet, Rfl: 0   Blood Glucose Monitoring Suppl (BLOOD GLUCOSE MONITOR SYSTEM) w/Device KIT, 1 Device by Does not apply route in the morning, at noon, and at bedtime., Disp: 1 kit, Rfl: 0   busPIRone  (BUSPAR ) 5 MG tablet, TAKE 1/2 TO 1 TABLET BY MOUTH 3 TIMES A DAY FOR ANXIETY, Disp: 270 tablet, Rfl: 0   Cholecalciferol  1.25 MG (50000  UT) TABS, Take 1 tablet by mouth once a week., Disp: 8 tablet, Rfl: 0   cyanocobalamin  1000 MCG tablet, Take 1 tablet (1,000 mcg total) by mouth daily., Disp: 100 tablet, Rfl: 0   glucose blood test strip, Test blood sugar once daily or as directed, Disp: 100 each, Rfl: 12   Lancets MISC, Test blood sugar once daily or as directed, Disp: 200 each, Rfl: 11   losartan  (COZAAR ) 50 MG tablet, Take 1 tablet (50 mg total) by mouth daily., Disp: 90 tablet, Rfl: 0  No Known Allergies  Objective:   BP (!) 154/94 (BP Location: Right Arm, Patient Position: Sitting, Cuff Size: Large)   Pulse 67   Temp 98.1 F (36.7 C) (Temporal)   Ht 6' 1.5 (1.867 m)   Wt 248 lb 3.2 oz (112.6 kg)   SpO2 97%   BMI 32.30 kg/m    Physical Exam Vitals reviewed.  Constitutional:      General: He is not in acute distress.    Appearance: Normal appearance. He is normal weight. He is not ill-appearing, toxic-appearing or diaphoretic.   Cardiovascular:     Rate and Rhythm: Normal rate.  Pulmonary:     Effort: Pulmonary effort is normal.     Breath sounds: Decreased air movement (left upper lobe)  present.   Musculoskeletal:        General: Normal range of motion.   Neurological:     General: No focal deficit present.     Mental Status: He is alert and oriented to person, place, and time. Mental status is at baseline.   Psychiatric:        Mood and Affect: Mood normal.        Behavior: Behavior normal.        Thought Content: Thought content normal.        Judgment: Judgment normal.     Assessment & Plan:  Primary hypertension Assessment & Plan: Baseline EKG today bradycardia possible left atrial enlargement hr 50 Average too high, will start amlodipine 5 mg once daily and continue losartan  50 mg once daily Urine m/a positive continue losartan .    Orders: -     amLODIPine Besylate; Take 1 tablet (5 mg total) by mouth daily.  Dispense: 90 tablet; Refill: 0 -     EKG 12-Lead -     ECHOCARDIOGRAM  COMPLETE; Future  Vitamin D  deficiency Assessment & Plan: Advised to continue RX dosing then once complete start with otc vitamin D3 2000 I/U once daily    Bradycardia Assessment & Plan: Noted on EKG .  Pt otherwise asymptomatic.  Will order echocardiogram pending results.    Orders: -     ECHOCARDIOGRAM COMPLETE; Future  Asymptomatic varicose veins of both lower extremities Assessment & Plan: Pt to follow with vascular surgery for eval   Cigarette smoker Assessment & Plan: Smoking cessation instruction/counseling given:  counseled patient on the dangers of tobacco use, advised patient to stop smoking, and reviewed strategies to maximize success  Again encouraged lung screening pt declined.      Follow up plan: Return in about 1 month (around 07/07/2024) for f/u blood pressure.  Felicita Horns, FNP

## 2024-06-19 ENCOUNTER — Telehealth: Payer: Self-pay | Admitting: Family

## 2024-06-19 DIAGNOSIS — I10 Essential (primary) hypertension: Secondary | ICD-10-CM

## 2024-06-19 NOTE — Telephone Encounter (Signed)
 Can we call to ask him about his blood pressure average?

## 2024-06-20 NOTE — Telephone Encounter (Signed)
 Left message to return call to our office.

## 2024-06-22 NOTE — Telephone Encounter (Signed)
 Called pt. This morning the BP reading was 152/92 with 52 bpm.  Pt states that was the only recording he has done for the day and says the medication seems to be working.

## 2024-06-26 MED ORDER — LOSARTAN POTASSIUM 100 MG PO TABS
100.0000 mg | ORAL_TABLET | Freq: Every day | ORAL | 0 refills | Status: DC
Start: 2024-06-26 — End: 2024-09-21

## 2024-06-26 NOTE — Telephone Encounter (Signed)
 Unable to reach patient. Left voicemail to return call to our office.

## 2024-06-26 NOTE — Telephone Encounter (Signed)
 Still a little higher than we'd like it.  Increase losartan  to 100 mg once daily.  Please ask pt to be consistent with checking blood pressure twice daily for at least five days so I have a better indication of his blood pressure average.

## 2024-06-27 NOTE — Telephone Encounter (Unsigned)
 Copied from CRM [id number]. Topic: General - Other >> Jun 27, 2024 11:25 AM [name] wrote: Reason for CRM: patient returning call from [facility], don't know what it is about. Called [facility] spoke with Jenna who stated to relay message from [place]  Read verbatim note  Erik Antu, FNP  Family Nurse Practitioner Specialty: Family Medicine   Telephone Encounter Signed  Encounter Date: 06/19/2024 Signed  Still a little higher than we'd like it.  Increase losartan  to 100 mg once daily.  Please ask pt to be consistent with checking blood pressure twice daily for at least five days so I have a better indication of his blood pressure average.    Patient wrote inform down, and will pick up medication today, and will start taking tomorrow. Patient understood information, and had no questions

## 2024-06-28 ENCOUNTER — Ambulatory Visit: Admitting: Urology

## 2024-06-28 ENCOUNTER — Encounter: Payer: Self-pay | Admitting: Urology

## 2024-06-28 DIAGNOSIS — N433 Hydrocele, unspecified: Secondary | ICD-10-CM | POA: Diagnosis not present

## 2024-06-28 DIAGNOSIS — R3129 Other microscopic hematuria: Secondary | ICD-10-CM | POA: Diagnosis not present

## 2024-06-28 LAB — URINALYSIS, COMPLETE
Bilirubin, UA: NEGATIVE
Glucose, UA: NEGATIVE
Ketones, UA: NEGATIVE
Leukocytes,UA: NEGATIVE
Nitrite, UA: NEGATIVE
Protein,UA: NEGATIVE
Specific Gravity, UA: 1.025 (ref 1.005–1.030)
Urobilinogen, Ur: 0.2 mg/dL (ref 0.2–1.0)
pH, UA: 6 (ref 5.0–7.5)

## 2024-06-28 LAB — MICROSCOPIC EXAMINATION

## 2024-06-28 NOTE — Progress Notes (Signed)
 I, Erik Quinn, acting as a scribe for Erik JAYSON Barba, MD., have documented all relevant documentation on the behalf of Erik JAYSON Barba, MD, as directed by Erik JAYSON Barba, MD while in the presence of Erik JAYSON Barba, MD.  06/28/2024 7:16 AM   Paxson Mario July 07, 1966 [id number]  Referring provider: Corwin Antu, FNP [address] [place],  [place] [address]  Chief Complaint  Patient presents with   Hydrocele    HPI: Erik Quinn is a 58 y.o. male referred for evaluation of bilateral hydroceles.   2 year history of bilateral scrotal swelling L>R He has no pain or discomfort Scrotal ultrasound ordered by his PCP showed normal appearing testes bilaterally and bilateral hydroceles L>R. No bothersome lower urinary tract symptoms Prior history of stone disease status post-ureteroscopy for an impacted left UV stone measuring 15 mm by Dr. Selma, July 2023   PMH: Past Medical History:  Diagnosis Date   GERD (gastroesophageal reflux disease)    History of kidney stones    Hypertension    Pt denies   Pre-diabetes     Surgical History: Past Surgical History:  Procedure Laterality Date   Arm surgery Left    CYSTOSCOPY/URETEROSCOPY/HOLMIUM LASER/STENT PLACEMENT Left 07/20/2022   Procedure: CYSTOSCOPY/URETEROSCOPY/HOLMIUM LASER/STENT PLACEMENT;  Surgeon: Selma Donnice SAUNDERS, MD;  Location: [facility];  Service: Urology;  Laterality: Left;  ONLY NEEDS 45 MIN   KNEE ARTHROSCOPY WITH LATERAL MENISECTOMY Left 09/01/2017   Procedure: ARTHROSCOPY LEFT KNEE, DEBRIDEMENT OF CHONDROMALACIA;  Surgeon: Liam Lerner, MD;  Location: [facility];  Service: Orthopedics;  Laterality: Left;   SPLENECTOMY  1994   TOTAL HIP ARTHROPLASTY Bilateral     Home Medications:  Allergies as of 06/28/2024   No Known Allergies      Medication List        Accurate as of June 28, 2024 11:59 PM. If you have any questions, ask your nurse or doctor.          amLODipine  5 MG  tablet Commonly known as: NORVASC  Take 1 tablet (5 mg total) by mouth daily.   Blood Glucose Monitor System w/Device Kit 1 Device by Does not apply route in the morning, at noon, and at bedtime.   busPIRone  5 MG tablet Commonly known as: BUSPAR  TAKE 1/2 TO 1 TABLET BY MOUTH 3 TIMES A DAY FOR ANXIETY   Cholecalciferol  1.25 MG (50000 UT) Tabs Take 1 tablet by mouth once a week.   cyanocobalamin  1000 MCG tablet Take 1 tablet (1,000 mcg total) by mouth daily.   glucose blood test strip Test blood sugar once daily or as directed   Lancets Misc Test blood sugar once daily or as directed   losartan  100 MG tablet Commonly known as: COZAAR  Take 1 tablet (100 mg total) by mouth daily.        Allergies: No Known Allergies  Family History: Family History  Problem Relation Age of Onset   Diabetes Father    Diabetes Paternal Grandfather     Social History:  reports that he quit smoking about 17 months ago. His smoking use included cigarettes. He started smoking about 41 years ago. He has a 20 pack-year smoking history. He has never used smokeless tobacco. He reports current alcohol use of about 8.0 standard drinks of alcohol per week. He reports that he does not use drugs.   Physical Exam: Constitutional:  Alert and oriented, No acute distress. HEENT: [place] AT, moist mucus membranes.  Trachea  midline, no masses. Cardiovascular: No clubbing, cyanosis, or edema. Respiratory: Normal respiratory effort, no increased work of breathing. GU: Moderate left hydrocele, small right hydrocele, left test is not palpable. Right testis palpably normal.  Psychiatric: Normal mood and affect.   Urinalysis Dipstick 2+ blood/microscopy 3-10 RBC.   Assessment & Plan:    1. Bilateral hydrocele Testis moderate left hydrocele and small right hydrocele.  Management options were discussed, including observation, hydrocelectomy, and aspiration. We discussed the high recurrence rate of aspiration.   He has elected observation.   2. Microhematuria Prior history of stone disease He does have a >30-pack year tobacco history and is a current smoker.  AUA hematuria stratification: high We discussed the recommended evaluation of high-risk hematuria, which includes evaluation with CT, urogram, and cystoscopy. The procedures were discussed in detail, and he has elected to proceed with further evaluations.  I have reviewed the above documentation for accuracy and completeness, and I agree with the above.   Erik JAYSON Barba, MD  [facility] [address], [address] [place], [place] [address] [phone]

## 2024-07-10 ENCOUNTER — Other Ambulatory Visit: Payer: Self-pay | Admitting: Family

## 2024-07-10 DIAGNOSIS — E559 Vitamin D deficiency, unspecified: Secondary | ICD-10-CM

## 2024-07-12 ENCOUNTER — Ambulatory Visit (HOSPITAL_COMMUNITY)
Admission: RE | Admit: 2024-07-12 | Discharge: 2024-07-12 | Disposition: A | Discharge status: Home / Self Care | Source: Ambulatory Visit | Attending: Family | Admitting: Family

## 2024-07-12 DIAGNOSIS — R9431 Abnormal electrocardiogram [ECG] [EKG]: Secondary | ICD-10-CM | POA: Insufficient documentation

## 2024-07-12 DIAGNOSIS — R001 Bradycardia, unspecified: Secondary | ICD-10-CM | POA: Diagnosis present

## 2024-07-12 DIAGNOSIS — I10 Essential (primary) hypertension: Secondary | ICD-10-CM | POA: Insufficient documentation

## 2024-07-12 LAB — ECHOCARDIOGRAM COMPLETE
AR max vel: 3.53 cm2
AV Area VTI: 3.55 cm2
AV Area mean vel: 3.52 cm2
AV Mean grad: 5 mmHg
AV Peak grad: 9.2 mmHg
Ao pk vel: 1.52 m/s
Area-P 1/2: 3.77 cm2
Calc EF: 58.4 %
S' Lateral: 4.95 cm
Single Plane A2C EF: 54.7 %
Single Plane A4C EF: 58.1 %

## 2024-07-13 ENCOUNTER — Ambulatory Visit: Payer: Self-pay | Admitting: Family

## 2024-07-13 DIAGNOSIS — R001 Bradycardia, unspecified: Secondary | ICD-10-CM

## 2024-07-13 DIAGNOSIS — I2721 Secondary pulmonary arterial hypertension: Secondary | ICD-10-CM

## 2024-07-13 DIAGNOSIS — I517 Cardiomegaly: Secondary | ICD-10-CM

## 2024-07-18 NOTE — Progress Notes (Signed)
 Ejection fraction (output of the heart) is good at 55-60%  Mild dilation of left ventricular cavity.   Right ventricle mildly enlarged, this could potentially be influenced from possible pulmonary hypertension which results in enlargement as it is trying to accommodate for the increased pressure into the heart.   Moderated elevated pulmonary artery systolic pressure. This overall means pressures in your lungs are high, which then pushes the high pressure into the right side of your heart so it can make your heart not work as well.   With this being said I am going to refer you to cardiology for ongoing management.

## 2024-08-09 ENCOUNTER — Ambulatory Visit
Admission: RE | Admit: 2024-08-09 | Discharge: 2024-08-09 | Disposition: A | Discharge status: Home / Self Care | Source: Ambulatory Visit | Attending: Urology | Admitting: Urology

## 2024-08-09 DIAGNOSIS — N433 Hydrocele, unspecified: Secondary | ICD-10-CM | POA: Diagnosis present

## 2024-08-09 MED ORDER — IOHEXOL 300 MG/ML  SOLN
100.0000 mL | Freq: Once | INTRAMUSCULAR | Status: AC | PRN
  Administered 2024-08-09: 100 mL via INTRAVENOUS

## 2024-08-16 ENCOUNTER — Encounter: Payer: Self-pay | Admitting: Urology

## 2024-08-16 ENCOUNTER — Ambulatory Visit: Admitting: Urology

## 2024-08-16 VITALS — BP 145/80 | HR 95 | Ht 74.0 in | Wt 248.0 lb

## 2024-08-16 DIAGNOSIS — R3129 Other microscopic hematuria: Secondary | ICD-10-CM

## 2024-08-16 LAB — URINALYSIS, COMPLETE
Bilirubin, UA: NEGATIVE
Glucose, UA: NEGATIVE
Ketones, UA: NEGATIVE
Leukocytes,UA: NEGATIVE
Nitrite, UA: NEGATIVE
Protein,UA: NEGATIVE
Specific Gravity, UA: 1.025 (ref 1.005–1.030)
Urobilinogen, Ur: 0.2 mg/dL (ref 0.2–1.0)
pH, UA: 6 (ref 5.0–7.5)

## 2024-08-16 LAB — MICROSCOPIC EXAMINATION: Bacteria, UA: NONE SEEN

## 2024-08-16 NOTE — Progress Notes (Addendum)
   08/16/24  CC:  Chief Complaint  Patient presents with   Cysto    HPI: Refer to my prior office note 06/28/2024.  UA showed 3-10 RBC.  CT urogram performed 08/09/2024 showed no upper tract abnormalities including renal mass, hydronephrosis or urinary tract stones.  UA today 11-30 RBC on microscopy  Blood pressure (!) 145/80, pulse 95, height 6' 2 (1.88 m), weight 248 lb (112.5 kg). NED. A&Ox3.   No respiratory distress   Abd soft, NT, ND Normal phallus with bilateral descended testicles  Cystoscopy Procedure Note  Patient identification was confirmed, informed consent was obtained, and patient was prepped using Betadine solution.  Lidocaine  jelly was administered per urethral meatus.     Pre-Procedure: - Inspection reveals a normal caliber urethral meatus.  Procedure: The flexible cystoscope was introduced without difficulty - No urethral strictures/lesions are present. -Moderate lateral lobe enlargement prostate with hypervascularity/friability -Mild elevation bladder neck - Bilateral ureteral orifices identified - Bladder mucosa  reveals no ulcers, tumors, or lesions - No bladder stones -Mild trabeculation  Retroflexion shows hypervascularity bladder neck   Post-Procedure: - Patient tolerated the procedure well  Assessment/ Plan: No upper tract abnormalities on CT urogram Hypervascularity of prostatic urethra No bladder mucosal abnormalities on cystoscopy 1 year follow-up with urinalysis    Erik JAYSON Barba, MD

## 2024-09-02 ENCOUNTER — Other Ambulatory Visit: Payer: Self-pay | Admitting: Family

## 2024-09-02 DIAGNOSIS — I10 Essential (primary) hypertension: Secondary | ICD-10-CM

## 2024-09-04 NOTE — Telephone Encounter (Unsigned)
 Copied from CRM #[id number]. Topic: Clinical - Medication Question >> Sep 04, 2024  9:17 AM Erik Quinn wrote: Reason for CRM: Patient calling to see if he still needs to take amLODipine ?

## 2024-09-12 ENCOUNTER — Other Ambulatory Visit: Payer: Self-pay | Admitting: Family

## 2024-09-12 DIAGNOSIS — F419 Anxiety disorder, unspecified: Secondary | ICD-10-CM

## 2024-09-12 DIAGNOSIS — R454 Irritability and anger: Secondary | ICD-10-CM

## 2024-09-12 NOTE — Telephone Encounter (Signed)
 FYI Only or Action Required?: Action required by provider: medication refill request.  Patient was last seen in primary care on 06/07/2024 by Corwin Antu, FNP.  Called Nurse Triage reporting No chief complaint on file..  Symptoms began today.  Interventions attempted: Nothing.  Symptoms are: stable.  Triage Disposition: No disposition on file.  Patient/caregiver understands and will follow disposition?:

## 2024-09-12 NOTE — Telephone Encounter (Signed)
 Copied from CRM [id number]. Topic: Clinical - Medication Refill >> Sep 12, 2024 10:12 AM Burnard DEL wrote: Medication: busPIRone  (BUSPAR ) 5 MG tablet  Has the patient contacted their pharmacy? No (Agent: If no, request that the patient contact the pharmacy for the refill. If patient does not wish to contact the pharmacy document the reason why and proceed with request.) (Agent: If yes, when and what did the pharmacy advise?)  This is the patient's preferred pharmacy:  [facility] [phone] [facility] [place] - [address] [place] [address] Phone: [phone] Fax: [phone]  Is this the correct pharmacy for this prescription? Yes If no, delete pharmacy and type the correct one.   Has the prescription been filled recently? No  Is the patient out of the medication? Yes  Has the patient been seen for an appointment in the last year OR does the patient have an upcoming appointment? Yes  Can we respond through MyChart? No  Agent: Please be advised that Rx refills may take up to 3 business days. We ask that you follow-up with your pharmacy. **Patient was prescribed this medication by previous provider**

## 2024-09-18 NOTE — Telephone Encounter (Signed)
 Can we ask pt how he takes his buspirone ?  Typically I want him taking the same dosing daily not 1/2 to 1 tablet up to three times a day.   Also let him know he is due for a f/u appt for his blood pressure.

## 2024-09-18 NOTE — Telephone Encounter (Signed)
 Spoke with pt and he states that he takes one tablet daily. He has been scheduled for his follow up on BP on 09/27/24 at 0800.

## 2024-09-21 ENCOUNTER — Other Ambulatory Visit: Payer: Self-pay | Admitting: Family

## 2024-09-21 DIAGNOSIS — I10 Essential (primary) hypertension: Secondary | ICD-10-CM

## 2024-09-22 MED ORDER — BUSPIRONE HCL 5 MG PO TABS: ORAL_TABLET | ORAL | 0 refills | Status: AC

## 2024-09-27 ENCOUNTER — Ambulatory Visit: Admitting: Family

## 2024-09-27 ENCOUNTER — Telehealth: Payer: Self-pay | Admitting: Family

## 2024-09-27 ENCOUNTER — Encounter: Payer: Self-pay | Admitting: Family

## 2024-09-27 VITALS — BP 132/80 | HR 62 | Temp 98.1°F | Ht 73.5 in | Wt 256.6 lb

## 2024-09-27 DIAGNOSIS — I10 Essential (primary) hypertension: Secondary | ICD-10-CM | POA: Diagnosis not present

## 2024-09-27 DIAGNOSIS — I7121 Aneurysm of the ascending aorta, without rupture: Secondary | ICD-10-CM | POA: Diagnosis not present

## 2024-09-27 DIAGNOSIS — I499 Cardiac arrhythmia, unspecified: Secondary | ICD-10-CM | POA: Diagnosis not present

## 2024-09-27 DIAGNOSIS — N433 Hydrocele, unspecified: Secondary | ICD-10-CM

## 2024-09-27 DIAGNOSIS — D3502 Benign neoplasm of left adrenal gland: Secondary | ICD-10-CM | POA: Diagnosis not present

## 2024-09-27 DIAGNOSIS — R932 Abnormal findings on diagnostic imaging of liver and biliary tract: Secondary | ICD-10-CM

## 2024-09-27 DIAGNOSIS — Z23 Encounter for immunization: Secondary | ICD-10-CM | POA: Diagnosis not present

## 2024-09-27 MED ORDER — AMLODIPINE BESYLATE 10 MG PO TABS: 10.0000 mg | ORAL_TABLET | Freq: Every day | ORAL | 0 refills | Status: DC

## 2024-09-27 NOTE — Telephone Encounter (Signed)
 Good morning   Is there anyway we could compare the liver from 01/06/2023 CT abd pelvis to this most recent CT hematuria order 08/09/24 to determine if there were any signs of cirrhosis back then to compare to?

## 2024-09-27 NOTE — Progress Notes (Signed)
 Established Patient Office Visit  Subjective:      CC:  Chief Complaint  Patient presents with   Medical Management of Chronic Issues    BP follow up    HPI: Erik Quinn is a 58 y.o. male presenting on 09/27/2024 for Medical Management of Chronic Issues (BP follow up) .  Discussed the use of AI scribe software for clinical note transcription with the patient, who gave verbal consent to proceed.  History of Present Illness Erik Quinn is a 58 year old male with hypertension and thoracic aortic aneurysm who presents for follow-up on multiple health issues.  His blood pressure readings at home are consistently around 140-150/90 mmHg, and his heart rate is usually between 50 to 54 beats per minute. No sluggishness is noted despite the low heart rate. He is not on medications like metoprolol that would lower his heart rate.  He has a thoracic aortic aneurysm measuring 4.3 cm. He is a smoker but has reduced his smoking to three to five cigarettes a day from a pack a day.  He mentions swelling in his knee that has been present for about eight months, which tightens up and stings at night. He has had knee injections in the past but has not seen an orthopedist in the last three years. He does not take any medication for the knee pain but has used muscle creams.  He is concerned about diabetes, noting a family history of diabetes, including his father. He mentions a previous A1c of 6.4, but his current A1c is 5.8, indicating improvement. He is trying to quit smoking but notes weight gain as a result.  He has a history of a left greater than right hydrocele, for which he had an ultrasound. He has a history of an adrenal adenoma and hepatic morphologic changes noted on imaging; his liver function tests have been reported as normal.  No chest pain, stomach pain, or other unusual symptoms. He mentions a friend who had a leg amputation due to diabetes and later died, which contributes to his  concern about diabetes.         Social history:  Relevant past medical, surgical, family and social history reviewed and updated as indicated. Interim medical history since our last visit reviewed.  Allergies and medications reviewed and updated.  DATA REVIEWED: CHART IN EPIC     ROS: Negative unless specifically indicated above in HPI.    Current Outpatient Medications:    amLODipine  (NORVASC ) 10 MG tablet, Take 1 tablet (10 mg total) by mouth daily., Disp: 90 tablet, Rfl: 0   Blood Glucose Monitoring Suppl (BLOOD GLUCOSE MONITOR SYSTEM) w/Device KIT, 1 Device by Does not apply route in the morning, at noon, and at bedtime., Disp: 1 kit, Rfl: 0   busPIRone  (BUSPAR ) 5 MG tablet, TAKE 1/2 TO 1 TABLET BY MOUTH 3 TIMES A DAY FOR ANXIETY, Disp: 270 tablet, Rfl: 0   Cholecalciferol  1.25 MG (50000 UT) TABS, Take 1 tablet by mouth once a week., Disp: 8 tablet, Rfl: 0   cyanocobalamin  1000 MCG tablet, Take 1 tablet (1,000 mcg total) by mouth daily., Disp: 100 tablet, Rfl: 0   glucose blood test strip, Test blood sugar once daily or as directed, Disp: 100 each, Rfl: 12   Lancets MISC, Test blood sugar once daily or as directed, Disp: 200 each, Rfl: 11   losartan  (COZAAR ) 100 MG tablet, TAKE 1 TABLET BY MOUTH EVERY DAY, Disp: 90 tablet, Rfl: 1  Objective:   Lab Results  Component Value Date   HGBA1C 5.8 05/17/2024         BP 132/80 (BP Location: Left Arm, Patient Position: Sitting, Cuff Size: Large)   Pulse 62   Temp 98.1 F (36.7 C) (Temporal)   Ht 6' 1.5 (1.867 m)   Wt 256 lb 9.6 oz (116.4 kg)   SpO2 99%   BMI 33.40 kg/m   Physical Exam CARDIOVASCULAR: Irregular heartbeat. EXTREMITIES: Swelling in the knee.  Wt Readings from Last 3 Encounters:  09/27/24 256 lb 9.6 oz (116.4 kg)  08/16/24 248 lb (112.5 kg)  06/07/24 248 lb 3.2 oz (112.6 kg)    Physical Exam Constitutional:      General: He is not in acute distress.    Appearance: Normal appearance.  He is normal weight. He is not ill-appearing, toxic-appearing or diaphoretic.  Cardiovascular:     Rate and Rhythm: Normal rate. Rhythm irregularly irregular.  Pulmonary:     Effort: Pulmonary effort is normal.  Musculoskeletal:        General: Normal range of motion.     Left knee: Swelling present. No erythema. Tenderness present over the MCL.  Neurological:     General: No focal deficit present.     Mental Status: He is alert and oriented to person, place, and time. Mental status is at baseline.  Psychiatric:        Mood and Affect: Mood normal.        Behavior: Behavior normal.        Thought Content: Thought content normal.        Judgment: Judgment normal.          Results RADIOLOGY Ultrasound scrotum: Left greater than right hydrocele CT scan: Liver suggestive of cirrhosis, 14mm stable adrenal adenoma, hepatic morphologic change suggestive of cirrhosis CT chest, abdomen, pelvis: 14mm hypodensity in central liver stable, likely benign cyst; no evidence of gallstones, gallbladder wall thickening, no intrahepatic or extrahepatic biliary dilatation; stable 17mm left adrenal adenoma (2022-12-21)  DIAGNOSTIC Thoracic aortic aneurysm: 4.3cm thoracic aortic aneurysm, recommended annual follow-up  Assessment & Plan:   Assessment and Plan Assessment & Plan Thoracic and Abdominal Aortic Aneurysms 4.3 cm thoracic aortic aneurysm with risk of rupture, exacerbated by hypertension and smoking. - Order CT angiography to measure thoracic aortic aneurysm. - Increase amlodipine  to 10 mg daily to better control blood pressure. - Advise smoking cessation to reduce risk factors.  Hypertension Uncontrolled hypertension with home readings around 140-150/90 mmHg. - Increase amlodipine  to 10 mg daily. - Advise monitoring blood pressure regularly at home.  Pulmonary Hypertension and Bradycardia Increased pulmonary artery systolic pressure and bradycardia with heart rate between 50-54 bpm.  Echocardiogram shows mild dilation of the left ventricular cavity and right ventricular enlargement. - Refer to cardiology for further evaluation of pulmonary hypertension and bradycardia.  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 A 14 mm hypodensity likely represents a benign cyst. Family history of liver disease is a concern. - Write to radiologist for clarification on liver findings.  Prediabetes Current A1c is 5.8%, improved from 6.4% a year ago. Concern about diabetes due to family history and recent weight gain from smoking cessation efforts. - Advise dietary modifications to maintain A1c levels. - Encourage continued efforts to quit smoking.  Right Knee Pain and Swelling Knee pain and swelling for 8 months with tenderness over the meniscus area. History of knee injections. -  Recommend over-the-counter Voltaren gel for knee pain. - Advise use of ice and compression sleeve for swelling. - Refer to Dr. Watt for further evaluation and possible MRI or injection.  General Health Maintenance Received a flu shot during the visit.  Irregular heart rhythm EKG in office Bradycardia HR 57 bpm  1 pac  Refer to cardiology for eval/treat        Return in about 6 weeks (around 11/08/2024) for f/u blood pressure.     Ginger Patrick, MSN, APRN, FNP-C  [facility]

## 2024-09-27 NOTE — Progress Notes (Unsigned)
 Cardiology Office Note   Date:  09/28/2024   ID:  Erik Quinn, DOB 1965/12/31, MRN [id number]  PCP:  Corwin Antu, FNP  Cardiologist:   None Referring:  Corwin Antu, FNP  No chief complaint on file.     History of Present Illness: Erik Quinn is a 58 y.o. male who presents for evaluation of elevated pulmonary pressures.     He is referred by Corwin Antu, FNP  Last year he was noted to have a 43 mm thoracic aortic dilatation.  This was done on a CT.  He has been having some chest discomfort and at that time was noted to have pleural effusion.  He underwent thoracentesis.  This was thought to be exudative related to chest wall injury from coughing.  He did not require further workup.  Today he denies any ongoing cardiovascular complaints.  He is not having any cough fevers or chills.  He denies any shortness of breath.  He has a little chronic lower extremity swelling that he really does not notice.  He does not have any PND or orthopnea.  He has had no palpitations, presyncope or syncope.  He has had no substernal chest pressure, neck or arm discomfort.  He did get an echo in Sept that demonstrated an EF of 55 - 60%.  There was moderately elevated pulmonary pressure.   He had some mildly reduced right ventricular dysfunction and pulmonary pressure was estimated to be 57.8.  RV size was slightly enlarged.  Incidentally he was found to have a variant takeoff of the right subclavian.  There is no mention of coronary calcium or aortic atherosclerosis.   Past Medical History:  Diagnosis Date   GERD (gastroesophageal reflux disease)    History of kidney stones    Hypertension    Pt denies   Pre-diabetes     Past Surgical History:  Procedure Laterality Date   Arm surgery Left    CYSTOSCOPY/URETEROSCOPY/HOLMIUM LASER/STENT PLACEMENT Left 07/20/2022   Procedure: CYSTOSCOPY/URETEROSCOPY/HOLMIUM LASER/STENT PLACEMENT;  Surgeon: Selma Donnice SAUNDERS, MD;  Location: [facility];  Service:  Urology;  Laterality: Left;  ONLY NEEDS 45 MIN   KNEE ARTHROSCOPY WITH LATERAL MENISECTOMY Left 09/01/2017   Procedure: ARTHROSCOPY LEFT KNEE, DEBRIDEMENT OF CHONDROMALACIA;  Surgeon: Liam Lerner, MD;  Location:  SUR[facility]ERY [facility];  Service: Orthopedics;  Laterality: Left;   SPLENECTOMY  1994   TOTAL HIP ARTHROPLASTY Bilateral      Current Outpatient Medications  Medication Sig Dispense Refill   amLODipine  (NORVASC ) 10 MG tablet Take 1 tablet (10 mg total) by mouth daily. 90 tablet 0   Blood Glucose Monitoring Suppl (BLOOD GLUCOSE MONITOR SYSTEM) w/Device KIT 1 Device by Does not apply route in the morning, at noon, and at bedtime. 1 kit 0   busPIRone  (BUSPAR ) 5 MG tablet TAKE 1/2 TO 1 TABLET BY MOUTH 3 TIMES A DAY FOR ANXIETY 270 tablet 0   Cholecalciferol  1.25 MG (50000 UT) TABS Take 1 tablet by mouth once a week. 8 tablet 0   cyanocobalamin  1000 MCG tablet Take 1 tablet (1,000 mcg total) by mouth daily. 100 tablet 0   Lancets MISC Test blood sugar once daily or as directed 200 each 11   losartan  (COZAAR ) 100 MG tablet TAKE 1 TABLET BY MOUTH EVERY DAY 90 tablet 1   glucose blood test strip Test blood sugar once daily or as directed (Patient not taking: Reported on 09/28/2024) 100 each 12   No current facility-administered medications for this visit.  Allergies:   Patient has no known allergies.    Social History:  The patient  reports that he has been smoking cigarettes. He started smoking about 41 years ago. He has a 20 pack-year smoking history. He has never used smokeless tobacco. He reports current alcohol use of about 8.0 standard drinks of alcohol per week. He reports that he does not use drugs.   Family History:  The patient's family history includes Diabetes in his father and paternal grandfather.    ROS:  Please see the history of present illness.   Otherwise, review of systems are positive for none.   All other systems are reviewed and negative.    PHYSICAL  EXAM: VS:  BP 124/84   Pulse 60   Ht 6' 2 (1.88 m)   Wt 253 lb (114.8 kg)   SpO2 97%   BMI 32.48 kg/m  , BMI Body mass index is 32.48 kg/m. GENERAL:  Well appearing HEENT:  Pupils equal round and reactive, fundi not visualized, oral mucosa unremarkable NECK:  No jugular venous distention, waveform within normal limits, carotid upstroke brisk and symmetric, no bruits, no thyromegaly LYMPHATICS:  No cervical, inguinal adenopathy LUNGS:  Clear to auscultation bilaterally BACK:  No CVA tenderness CHEST:  Unremarkable HEART:  PMI not displaced or sustained,S1 and S2 within normal limits, no S3, no S4, no clicks, no rubs, no murmurs ABD:  Flat, positive bowel sounds normal in frequency in pitch, no bruits, no rebound, no guarding, no midline pulsatile mass, no hepatomegaly, no splenomegaly EXT:  2 plus pulses throughout, right greater than left leg edema, no cyanosis no clubbing SKIN:  No rashes no nodules NEURO:  Cranial nerves II through XII grossly intact, motor grossly intact throughout PSYCH:  Cognitively intact, oriented to person place and time    EKG:     Sinus bradycardia, rate 53, premature atrial contractions, incomplete right bundle branch block.  09/27/2024    Recent Labs: 05/17/2024: ALT 13; BUN 14; Creatinine, Ser 0.89; Hemoglobin 16.9; Platelets 249.0; Potassium 3.9; Sodium 140; TSH 1.86    Lipid Panel    Component Value Date/Time   CHOL 116 05/17/2024 0925   TRIG 79.0 05/17/2024 0925   HDL 68.30 05/17/2024 0925   CHOLHDL 2 05/17/2024 0925   VLDL 15.8 05/17/2024 0925   LDLCALC 32 05/17/2024 0925   LDLCALC 44 07/15/2023 1109      Wt Readings from Last 3 Encounters:  09/28/24 253 lb (114.8 kg)  09/27/24 256 lb 9.6 oz (116.4 kg)  08/16/24 248 lb (112.5 kg)      Other studies Reviewed: Additional studies/ records that were reviewed today include: Extensive review of pulmonary and primary care records as well as review of outside imaging. Review of the above  records demonstrates:  Please see elsewhere in the note.     ASSESSMENT AND PLAN:   Elevated pulmonary pressures: He does have some mild elevated pulmonary pressures.  At this point he has no symptoms related to this.  I will follow this with an echo in the spring of next year and see him after this.  We did talk about the need to stop smoking.  No other change in therapy is necessary at this time.  AS:  This was very mild.  I will follow this clinically.  Bradycardia: He has no symptoms related to this.  I did ask him about snoring and he does do that but he says he wakes up rested.  He really has a low  STOP-BANG.  He is not having any symptoms related to bradycardia.  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  No change in therapy.  Tobacco abuse: We talked about the need to complete stay off and he has cut way back.  Current medicines are reviewed at length with the patient today.  The patient does not have concerns regarding medicines.  The following changes have been made:  no change  Labs/ tests ordered today include:   Orders Placed This Encounter  Procedures   ECHOCARDIOGRAM COMPLETE     Disposition:   FU with me after the echo.      Signed, Lynwood Schilling, MD  09/28/2024 3:16 PM    [facility]

## 2024-09-27 NOTE — Patient Instructions (Addendum)
  Please call this number to get caught up with the heart doctor and make an appointment to have a consult :   [address], [place], [place] [address]  [phone]   ------------------------------------ Try some over the counter voltaren gel for your knee

## 2024-09-28 ENCOUNTER — Encounter: Payer: Self-pay | Admitting: Cardiology

## 2024-09-28 ENCOUNTER — Ambulatory Visit: Attending: Cardiology | Admitting: Cardiology

## 2024-09-28 VITALS — BP 124/84 | HR 60 | Ht 74.0 in | Wt 253.0 lb

## 2024-09-28 DIAGNOSIS — I350 Nonrheumatic aortic (valve) stenosis: Secondary | ICD-10-CM | POA: Diagnosis not present

## 2024-09-28 DIAGNOSIS — I272 Pulmonary hypertension, unspecified: Secondary | ICD-10-CM | POA: Diagnosis not present

## 2024-09-28 NOTE — Patient Instructions (Signed)
 Medication Instructions:  Your physician recommends that you continue on your current medications as directed. Please refer to the Current Medication list given to you today.  *If you need a refill on your cardiac medications before your next appointment, please call your pharmacy*  Lab Work: NONE If you have labs (blood work) drawn today and your tests are completely normal, you will receive your results only by: MyChart Message (if you have MyChart) OR A paper copy in the mail If you have any lab test that is abnormal or we need to change your treatment, we will call you to review the results.  Testing/Procedures: Echocardiogram in May 2026 Your physician has requested that you have an echocardiogram. Echocardiography is a painless test that uses sound waves to create images of your heart. It provides your doctor with information about the size and shape of your heart and how well your heart's chambers and valves are working. This procedure takes approximately one hour. There are no restrictions for this procedure. Please do NOT wear cologne, perfume, aftershave, or lotions (deodorant is allowed). Please arrive 15 minutes prior to your appointment time.  Please note: We ask at that you not bring children with you during ultrasound (echo/ vascular) testing. Due to room size and safety concerns, children are not allowed in the ultrasound rooms during exams. Our front office staff cannot provide observation of children in our lobby area while testing is being conducted. An adult accompanying a patient to their appointment will only be allowed in the ultrasound room at the discretion of the ultrasound technician under special circumstances. We apologize for any inconvenience.   Follow-Up: At [facility], you and your health needs are our priority.  As part of our continuing mission to provide you with exceptional heart care, our providers are all part of one team.  This team includes  your primary Cardiologist (physician) and Advanced Practice Providers or APPs (Physician Assistants and Nurse Practitioners) who all work together to provide you with the care you need, when you need it.  Your next appointment:   May 2026 after echo  Provider:   [place], MD  We recommend signing up for the patient portal called MyChart.  Sign up information is provided on this After Visit Summary.  MyChart is used to connect with patients for Virtual Visits (Telemedicine).  Patients are able to view lab/test results, encounter notes, upcoming appointments, etc.  Non-urgent messages can be sent to your provider as well.   To learn more about what you can do with MyChart, go to [email or web address].

## 2024-10-03 NOTE — Telephone Encounter (Signed)
 [facility] and talked with Gabe. He is going to send a message to the radiologist to compare the CTs.

## 2024-10-03 NOTE — Telephone Encounter (Signed)
 In regards to his recent CT scan there was noted to be some cirrhosis. Could we ask the radiologist to compare to his previous scan 01/06/23 to see if there was any evidence of cirrhosis back then? I attempted to call the radiologist but he did not respond.

## 2024-10-13 NOTE — Telephone Encounter (Signed)
 Lab visit has been scheduled for 10/16/24.

## 2024-10-13 NOTE — Telephone Encounter (Signed)
 LM for pt to return call.   Please provide message from provider/office when call is returned from patient as well as scheduling a lab appt for the pt.

## 2024-10-13 NOTE — Addendum Note (Signed)
 Addended by: CORWIN ANTU on: 10/13/2024 07:54 AM   Modules accepted: Orders

## 2024-10-13 NOTE — Telephone Encounter (Signed)
 In regards to possible cirrhosis seen on the recent CT scan I am going to have him complete an additional blood test to see if this is in fact true and if so what degree/level is it?   Can he come in for a lab only?

## 2024-10-16 ENCOUNTER — Other Ambulatory Visit (INDEPENDENT_AMBULATORY_CARE_PROVIDER_SITE_OTHER)

## 2024-10-16 DIAGNOSIS — R932 Abnormal findings on diagnostic imaging of liver and biliary tract: Secondary | ICD-10-CM | POA: Diagnosis not present

## 2024-10-18 ENCOUNTER — Ambulatory Visit: Payer: Self-pay | Admitting: Family

## 2024-10-18 LAB — NASH FIBROSURE(R) PLUS
ALPHA 2-MACROGLOBULINS, QN: 172 mg/dL (ref 110–276)
ALT (SGPT) P5P: 20 IU/L (ref 0–55)
AST (SGOT) P5P: 19 IU/L (ref 0–40)
Apolipoprotein A-1: 170 mg/dL (ref 101–178)
Bilirubin, Total: 0.5 mg/dL (ref 0.0–1.2)
Cholesterol, Total: 133 mg/dL (ref 100–199)
Fibrosis Score: 0.13 (ref 0.00–0.21)
GGT: 17 IU/L (ref 0–65)
Glucose: 127 mg/dL — ABNORMAL HIGH (ref 70–99)
Haptoglobin: 149 mg/dL (ref 29–370)
NASH Score: 0 (ref 0.00–0.25)
Steatosis Score: 0.33 (ref 0.00–0.40)
Triglycerides: 103 mg/dL (ref 0–149)

## 2024-10-18 NOTE — Telephone Encounter (Signed)
 Copied from CRM [id number]. Topic: General - Call Back - No Documentation >> Oct 18, 2024  8:48 AM Frederich PARAS wrote: Reason for CRM: pt called due to missed call, I adv him of the notes on file lft by provider office, pt still wanted to speak to Alessander Sikorski. Please  have Latitia Housewright reach out to the pt .

## 2025-01-22 ENCOUNTER — Other Ambulatory Visit: Payer: Self-pay | Admitting: Family

## 2025-01-22 DIAGNOSIS — I10 Essential (primary) hypertension: Secondary | ICD-10-CM

## 2025-08-17 ENCOUNTER — Ambulatory Visit: Admitting: Urology
